# Patient Record
Sex: Female | Born: 1965 | Race: Black or African American | Hispanic: No | Smoking: Former smoker
Health system: Southern US, Community
[De-identification: ages and names within clinical notes are randomized; demographics above are authoritative.]

## PROBLEM LIST (undated history)

## (undated) DIAGNOSIS — E669 Obesity, unspecified: Secondary | ICD-10-CM

## (undated) DIAGNOSIS — M549 Dorsalgia, unspecified: Secondary | ICD-10-CM

## (undated) DIAGNOSIS — G8929 Other chronic pain: Secondary | ICD-10-CM

## (undated) DIAGNOSIS — K2970 Gastritis, unspecified, without bleeding: Secondary | ICD-10-CM

## (undated) DIAGNOSIS — F17200 Nicotine dependence, unspecified, uncomplicated: Secondary | ICD-10-CM

## (undated) DIAGNOSIS — N83209 Unspecified ovarian cyst, unspecified side: Secondary | ICD-10-CM

## (undated) HISTORY — PX: OTHER SURGICAL HISTORY: SHX169

## (undated) HISTORY — DX: Other chronic pain: G89.29

## (undated) HISTORY — PX: TUBAL LIGATION: SHX77

## (undated) HISTORY — DX: Gastritis, unspecified, without bleeding: K29.70

## (undated) HISTORY — DX: Nicotine dependence, unspecified, uncomplicated: F17.200

## (undated) HISTORY — DX: Obesity, unspecified: E66.9

## (undated) HISTORY — DX: Dorsalgia, unspecified: M54.9

---
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
## 1989-05-27 HISTORY — PX: OTHER SURGICAL HISTORY: SHX169

## 1991-05-28 HISTORY — PX: OTHER SURGICAL HISTORY: SHX169

## 1998-09-14 ENCOUNTER — Other Ambulatory Visit: Admission: RE | Admit: 1998-09-14 | Discharge: 1998-09-14 | Payer: Self-pay | Admitting: Family Medicine

## 1999-02-23 ENCOUNTER — Encounter: Payer: Self-pay | Admitting: Family Medicine

## 1999-02-23 ENCOUNTER — Ambulatory Visit (HOSPITAL_COMMUNITY): Admission: RE | Admit: 1999-02-23 | Discharge: 1999-02-23 | Payer: Self-pay | Admitting: Family Medicine

## 2000-10-11 ENCOUNTER — Emergency Department (HOSPITAL_COMMUNITY): Admission: EM | Admit: 2000-10-11 | Discharge: 2000-10-12 | Payer: Self-pay | Admitting: *Deleted

## 2001-09-04 ENCOUNTER — Emergency Department (HOSPITAL_COMMUNITY): Admission: EM | Admit: 2001-09-04 | Discharge: 2001-09-04 | Payer: Self-pay | Admitting: Internal Medicine

## 2002-04-26 ENCOUNTER — Encounter: Payer: Self-pay | Admitting: Family Medicine

## 2002-04-26 ENCOUNTER — Ambulatory Visit (HOSPITAL_COMMUNITY): Admission: RE | Admit: 2002-04-26 | Discharge: 2002-04-26 | Payer: Self-pay | Admitting: Family Medicine

## 2002-09-13 ENCOUNTER — Encounter (HOSPITAL_COMMUNITY): Admission: RE | Admit: 2002-09-13 | Discharge: 2002-10-13 | Payer: Self-pay | Admitting: Family Medicine

## 2002-10-06 ENCOUNTER — Ambulatory Visit (HOSPITAL_COMMUNITY): Admission: RE | Admit: 2002-10-06 | Discharge: 2002-10-06 | Payer: Self-pay | Admitting: Family Medicine

## 2002-10-06 ENCOUNTER — Encounter: Payer: Self-pay | Admitting: Family Medicine

## 2002-10-13 ENCOUNTER — Encounter (HOSPITAL_COMMUNITY): Admission: RE | Admit: 2002-10-13 | Discharge: 2002-11-12 | Payer: Self-pay | Admitting: Family Medicine

## 2002-11-23 ENCOUNTER — Encounter (HOSPITAL_COMMUNITY): Admission: RE | Admit: 2002-11-23 | Discharge: 2002-12-23 | Payer: Self-pay | Admitting: Family Medicine

## 2003-02-22 ENCOUNTER — Ambulatory Visit (HOSPITAL_COMMUNITY): Admission: RE | Admit: 2003-02-22 | Discharge: 2003-02-22 | Payer: Self-pay | Admitting: Family Medicine

## 2003-02-22 ENCOUNTER — Encounter: Payer: Self-pay | Admitting: Family Medicine

## 2003-02-28 ENCOUNTER — Encounter: Payer: Self-pay | Admitting: Orthopedic Surgery

## 2003-03-07 ENCOUNTER — Encounter: Payer: Self-pay | Admitting: Orthopedic Surgery

## 2003-03-08 ENCOUNTER — Encounter (HOSPITAL_COMMUNITY): Admission: RE | Admit: 2003-03-08 | Discharge: 2003-04-07 | Payer: Self-pay | Admitting: Orthopedic Surgery

## 2004-04-27 ENCOUNTER — Ambulatory Visit: Payer: Self-pay | Admitting: Family Medicine

## 2004-10-02 ENCOUNTER — Ambulatory Visit: Payer: Self-pay | Admitting: Family Medicine

## 2004-11-29 ENCOUNTER — Ambulatory Visit: Payer: Self-pay | Admitting: Family Medicine

## 2004-12-18 ENCOUNTER — Ambulatory Visit: Payer: Self-pay | Admitting: Family Medicine

## 2005-04-15 ENCOUNTER — Ambulatory Visit: Payer: Self-pay | Admitting: Family Medicine

## 2005-05-27 HISTORY — PX: CHOLECYSTECTOMY: SHX55

## 2005-06-11 ENCOUNTER — Ambulatory Visit: Payer: Self-pay | Admitting: Family Medicine

## 2005-06-11 ENCOUNTER — Ambulatory Visit (HOSPITAL_COMMUNITY): Admission: RE | Admit: 2005-06-11 | Discharge: 2005-06-11 | Payer: Self-pay | Admitting: Family Medicine

## 2005-07-03 ENCOUNTER — Ambulatory Visit (HOSPITAL_COMMUNITY): Admission: RE | Admit: 2005-07-03 | Discharge: 2005-07-03 | Payer: Self-pay | Admitting: Family Medicine

## 2005-07-16 ENCOUNTER — Ambulatory Visit: Payer: Self-pay | Admitting: Family Medicine

## 2005-07-17 ENCOUNTER — Ambulatory Visit (HOSPITAL_COMMUNITY): Admission: RE | Admit: 2005-07-17 | Discharge: 2005-07-17 | Payer: Self-pay | Admitting: Family Medicine

## 2005-07-23 ENCOUNTER — Encounter (HOSPITAL_COMMUNITY): Admission: RE | Admit: 2005-07-23 | Discharge: 2005-08-22 | Payer: Self-pay | Admitting: Family Medicine

## 2005-08-05 ENCOUNTER — Ambulatory Visit: Payer: Self-pay | Admitting: Family Medicine

## 2005-08-06 ENCOUNTER — Ambulatory Visit (HOSPITAL_COMMUNITY): Admission: RE | Admit: 2005-08-06 | Discharge: 2005-08-06 | Payer: Self-pay | Admitting: Family Medicine

## 2005-08-07 ENCOUNTER — Ambulatory Visit: Payer: Self-pay | Admitting: Family Medicine

## 2005-12-04 ENCOUNTER — Ambulatory Visit: Payer: Self-pay | Admitting: Family Medicine

## 2005-12-04 ENCOUNTER — Other Ambulatory Visit: Admission: RE | Admit: 2005-12-04 | Discharge: 2005-12-04 | Payer: Self-pay | Admitting: Family Medicine

## 2006-01-27 ENCOUNTER — Emergency Department (HOSPITAL_COMMUNITY): Admission: EM | Admit: 2006-01-27 | Discharge: 2006-01-27 | Payer: Self-pay | Admitting: Emergency Medicine

## 2006-02-11 ENCOUNTER — Ambulatory Visit: Payer: Self-pay | Admitting: Family Medicine

## 2006-03-05 ENCOUNTER — Ambulatory Visit: Payer: Self-pay | Admitting: Family Medicine

## 2006-03-13 ENCOUNTER — Ambulatory Visit (HOSPITAL_COMMUNITY): Admission: RE | Admit: 2006-03-13 | Discharge: 2006-03-13 | Payer: Self-pay | Admitting: Family Medicine

## 2006-03-17 ENCOUNTER — Encounter (HOSPITAL_COMMUNITY): Admission: RE | Admit: 2006-03-17 | Discharge: 2006-04-16 | Payer: Self-pay | Admitting: Family Medicine

## 2006-03-28 ENCOUNTER — Encounter (INDEPENDENT_AMBULATORY_CARE_PROVIDER_SITE_OTHER): Payer: Self-pay | Admitting: *Deleted

## 2006-03-28 ENCOUNTER — Ambulatory Visit (HOSPITAL_COMMUNITY): Admission: RE | Admit: 2006-03-28 | Discharge: 2006-03-28 | Payer: Self-pay | Admitting: General Surgery

## 2006-07-03 ENCOUNTER — Ambulatory Visit: Payer: Self-pay | Admitting: Family Medicine

## 2006-07-07 ENCOUNTER — Ambulatory Visit (HOSPITAL_COMMUNITY): Admission: RE | Admit: 2006-07-07 | Discharge: 2006-07-07 | Payer: Self-pay | Admitting: Family Medicine

## 2006-07-11 ENCOUNTER — Ambulatory Visit: Payer: Self-pay | Admitting: Family Medicine

## 2006-07-12 ENCOUNTER — Encounter: Payer: Self-pay | Admitting: Family Medicine

## 2006-07-14 ENCOUNTER — Ambulatory Visit (HOSPITAL_COMMUNITY): Admission: RE | Admit: 2006-07-14 | Discharge: 2006-07-14 | Payer: Self-pay | Admitting: Family Medicine

## 2006-08-22 ENCOUNTER — Ambulatory Visit: Payer: Self-pay | Admitting: Family Medicine

## 2006-09-18 ENCOUNTER — Encounter (INDEPENDENT_AMBULATORY_CARE_PROVIDER_SITE_OTHER): Payer: Self-pay | Admitting: Specialist

## 2006-09-18 ENCOUNTER — Ambulatory Visit (HOSPITAL_COMMUNITY): Admission: RE | Admit: 2006-09-18 | Discharge: 2006-09-18 | Payer: Self-pay | Admitting: Obstetrics and Gynecology

## 2006-12-19 ENCOUNTER — Emergency Department (HOSPITAL_COMMUNITY): Admission: EM | Admit: 2006-12-19 | Discharge: 2006-12-19 | Payer: Self-pay | Admitting: Emergency Medicine

## 2006-12-22 ENCOUNTER — Ambulatory Visit: Payer: Self-pay | Admitting: Family Medicine

## 2007-01-14 ENCOUNTER — Encounter (INDEPENDENT_AMBULATORY_CARE_PROVIDER_SITE_OTHER): Payer: Self-pay | Admitting: *Deleted

## 2007-01-15 ENCOUNTER — Encounter: Payer: Self-pay | Admitting: Family Medicine

## 2007-01-15 ENCOUNTER — Other Ambulatory Visit: Admission: RE | Admit: 2007-01-15 | Discharge: 2007-01-15 | Payer: Self-pay | Admitting: Family Medicine

## 2007-01-15 ENCOUNTER — Ambulatory Visit: Payer: Self-pay | Admitting: Family Medicine

## 2007-01-15 LAB — CONVERTED CEMR LAB: Pap Smear: NORMAL

## 2007-01-16 ENCOUNTER — Encounter: Payer: Self-pay | Admitting: Family Medicine

## 2007-01-16 LAB — CONVERTED CEMR LAB
Gardnerella vaginalis: POSITIVE — AB
Trichomonal Vaginitis: NEGATIVE

## 2007-01-19 ENCOUNTER — Encounter: Payer: Self-pay | Admitting: Family Medicine

## 2007-01-19 LAB — CONVERTED CEMR LAB

## 2007-02-02 ENCOUNTER — Encounter: Admission: RE | Admit: 2007-02-02 | Discharge: 2007-02-02 | Payer: Self-pay | Admitting: Family Medicine

## 2007-02-26 ENCOUNTER — Ambulatory Visit: Payer: Self-pay | Admitting: Family Medicine

## 2007-05-28 ENCOUNTER — Emergency Department (HOSPITAL_COMMUNITY): Admission: EM | Admit: 2007-05-28 | Discharge: 2007-05-28 | Payer: Self-pay | Admitting: Emergency Medicine

## 2007-05-28 ENCOUNTER — Encounter: Payer: Self-pay | Admitting: Family Medicine

## 2007-06-08 ENCOUNTER — Ambulatory Visit: Payer: Self-pay | Admitting: Family Medicine

## 2007-06-08 ENCOUNTER — Ambulatory Visit (HOSPITAL_COMMUNITY): Admission: RE | Admit: 2007-06-08 | Discharge: 2007-06-08 | Payer: Self-pay | Admitting: Family Medicine

## 2007-07-23 ENCOUNTER — Encounter (HOSPITAL_COMMUNITY): Admission: RE | Admit: 2007-07-23 | Discharge: 2007-08-22 | Payer: Self-pay | Admitting: Family Medicine

## 2007-07-28 ENCOUNTER — Ambulatory Visit: Payer: Self-pay | Admitting: Family Medicine

## 2007-08-11 ENCOUNTER — Encounter (INDEPENDENT_AMBULATORY_CARE_PROVIDER_SITE_OTHER): Payer: Self-pay | Admitting: *Deleted

## 2007-08-11 DIAGNOSIS — M549 Dorsalgia, unspecified: Secondary | ICD-10-CM

## 2007-08-11 DIAGNOSIS — K649 Unspecified hemorrhoids: Secondary | ICD-10-CM | POA: Insufficient documentation

## 2007-08-11 DIAGNOSIS — F17200 Nicotine dependence, unspecified, uncomplicated: Secondary | ICD-10-CM | POA: Insufficient documentation

## 2007-08-11 DIAGNOSIS — E669 Obesity, unspecified: Secondary | ICD-10-CM

## 2007-08-24 ENCOUNTER — Encounter (HOSPITAL_COMMUNITY): Admission: RE | Admit: 2007-08-24 | Discharge: 2007-09-23 | Payer: Self-pay | Admitting: Family Medicine

## 2007-10-27 ENCOUNTER — Encounter: Payer: Self-pay | Admitting: Family Medicine

## 2007-10-27 LAB — CONVERTED CEMR LAB
Amylase: 31 units/L (ref 0–105)
Calcium: 9.3 mg/dL (ref 8.4–10.5)
Glucose, Bld: 97 mg/dL (ref 70–99)
Helicobacter Pylori Antibody-IgG: 3.2 — ABNORMAL HIGH

## 2007-10-28 ENCOUNTER — Ambulatory Visit (HOSPITAL_COMMUNITY): Admission: RE | Admit: 2007-10-28 | Discharge: 2007-10-28 | Payer: Self-pay | Admitting: Family Medicine

## 2007-12-17 ENCOUNTER — Encounter: Payer: Self-pay | Admitting: Family Medicine

## 2007-12-21 ENCOUNTER — Ambulatory Visit: Payer: Self-pay | Admitting: Family Medicine

## 2007-12-21 LAB — CONVERTED CEMR LAB: GC Probe Amp, Genital: NEGATIVE

## 2007-12-22 ENCOUNTER — Encounter: Payer: Self-pay | Admitting: Family Medicine

## 2007-12-22 LAB — CONVERTED CEMR LAB
Candida species: NEGATIVE
Gardnerella vaginalis: POSITIVE — AB
Trichomonal Vaginitis: NEGATIVE

## 2007-12-24 ENCOUNTER — Telehealth: Payer: Self-pay | Admitting: Family Medicine

## 2007-12-29 ENCOUNTER — Telehealth: Payer: Self-pay | Admitting: Family Medicine

## 2008-01-19 ENCOUNTER — Ambulatory Visit: Payer: Self-pay | Admitting: Family Medicine

## 2008-01-19 ENCOUNTER — Telehealth: Payer: Self-pay | Admitting: Family Medicine

## 2008-01-19 DIAGNOSIS — R5381 Other malaise: Secondary | ICD-10-CM

## 2008-01-19 DIAGNOSIS — R5383 Other fatigue: Secondary | ICD-10-CM

## 2008-01-19 LAB — CONVERTED CEMR LAB: Rapid Strep: NEGATIVE

## 2008-01-22 ENCOUNTER — Telehealth: Payer: Self-pay | Admitting: Family Medicine

## 2008-02-25 ENCOUNTER — Encounter: Payer: Self-pay | Admitting: Family Medicine

## 2008-03-02 ENCOUNTER — Telehealth: Payer: Self-pay | Admitting: Family Medicine

## 2008-03-03 ENCOUNTER — Encounter: Payer: Self-pay | Admitting: Family Medicine

## 2008-03-07 ENCOUNTER — Ambulatory Visit: Payer: Self-pay | Admitting: Family Medicine

## 2008-03-07 ENCOUNTER — Other Ambulatory Visit: Admission: RE | Admit: 2008-03-07 | Discharge: 2008-03-07 | Payer: Self-pay | Admitting: Family Medicine

## 2008-03-07 ENCOUNTER — Encounter: Payer: Self-pay | Admitting: Family Medicine

## 2008-03-07 LAB — CONVERTED CEMR LAB: Pap Smear: NORMAL

## 2008-03-08 ENCOUNTER — Telehealth: Payer: Self-pay | Admitting: Family Medicine

## 2008-03-09 ENCOUNTER — Encounter: Payer: Self-pay | Admitting: Family Medicine

## 2008-03-09 DIAGNOSIS — N771 Vaginitis, vulvitis and vulvovaginitis in diseases classified elsewhere: Secondary | ICD-10-CM | POA: Insufficient documentation

## 2008-03-10 ENCOUNTER — Telehealth: Payer: Self-pay | Admitting: Family Medicine

## 2008-03-14 ENCOUNTER — Ambulatory Visit (HOSPITAL_COMMUNITY): Admission: RE | Admit: 2008-03-14 | Discharge: 2008-03-14 | Payer: Self-pay | Admitting: Family Medicine

## 2008-03-14 ENCOUNTER — Encounter: Payer: Self-pay | Admitting: Family Medicine

## 2008-03-15 ENCOUNTER — Encounter: Payer: Self-pay | Admitting: Family Medicine

## 2008-03-25 ENCOUNTER — Encounter: Payer: Self-pay | Admitting: Family Medicine

## 2008-03-30 ENCOUNTER — Encounter: Payer: Self-pay | Admitting: Family Medicine

## 2008-05-12 ENCOUNTER — Encounter: Payer: Self-pay | Admitting: Family Medicine

## 2008-05-23 ENCOUNTER — Encounter: Payer: Self-pay | Admitting: Family Medicine

## 2008-06-21 ENCOUNTER — Ambulatory Visit: Payer: Self-pay | Admitting: Family Medicine

## 2008-06-21 DIAGNOSIS — M25569 Pain in unspecified knee: Secondary | ICD-10-CM

## 2008-06-21 DIAGNOSIS — F411 Generalized anxiety disorder: Secondary | ICD-10-CM

## 2008-06-21 LAB — CONVERTED CEMR LAB
ALT: 16 units/L (ref 0–35)
AST: 14 units/L (ref 0–37)
Bilirubin, Direct: 0.1 mg/dL (ref 0.0–0.3)
Indirect Bilirubin: 0.2 mg/dL (ref 0.0–0.9)

## 2008-06-28 ENCOUNTER — Telehealth: Payer: Self-pay | Admitting: Family Medicine

## 2008-06-29 DIAGNOSIS — N943 Premenstrual tension syndrome: Secondary | ICD-10-CM | POA: Insufficient documentation

## 2008-07-12 ENCOUNTER — Telehealth: Payer: Self-pay | Admitting: Family Medicine

## 2008-08-03 ENCOUNTER — Ambulatory Visit: Payer: Self-pay | Admitting: Internal Medicine

## 2008-08-03 ENCOUNTER — Encounter: Payer: Self-pay | Admitting: Gastroenterology

## 2008-08-03 DIAGNOSIS — K921 Melena: Secondary | ICD-10-CM | POA: Insufficient documentation

## 2008-08-03 DIAGNOSIS — R1319 Other dysphagia: Secondary | ICD-10-CM

## 2008-08-03 DIAGNOSIS — K5900 Constipation, unspecified: Secondary | ICD-10-CM | POA: Insufficient documentation

## 2008-08-03 DIAGNOSIS — K625 Hemorrhage of anus and rectum: Secondary | ICD-10-CM | POA: Insufficient documentation

## 2008-08-04 ENCOUNTER — Encounter: Payer: Self-pay | Admitting: Internal Medicine

## 2008-08-19 ENCOUNTER — Ambulatory Visit: Payer: Self-pay | Admitting: Internal Medicine

## 2008-08-19 ENCOUNTER — Ambulatory Visit (HOSPITAL_COMMUNITY): Admission: RE | Admit: 2008-08-19 | Discharge: 2008-08-19 | Payer: Self-pay | Admitting: Internal Medicine

## 2008-08-19 ENCOUNTER — Encounter: Payer: Self-pay | Admitting: Internal Medicine

## 2008-08-19 HISTORY — PX: COLONOSCOPY: SHX174

## 2008-08-19 HISTORY — PX: ESOPHAGOGASTRODUODENOSCOPY: SHX1529

## 2008-08-31 ENCOUNTER — Encounter: Payer: Self-pay | Admitting: Internal Medicine

## 2008-09-13 ENCOUNTER — Encounter: Payer: Self-pay | Admitting: Family Medicine

## 2008-09-15 ENCOUNTER — Telehealth: Payer: Self-pay | Admitting: Family Medicine

## 2008-09-19 ENCOUNTER — Telehealth: Payer: Self-pay | Admitting: Family Medicine

## 2008-09-20 ENCOUNTER — Encounter: Payer: Self-pay | Admitting: Family Medicine

## 2008-09-21 ENCOUNTER — Telehealth: Payer: Self-pay | Admitting: Family Medicine

## 2008-09-28 ENCOUNTER — Telehealth: Payer: Self-pay | Admitting: Family Medicine

## 2008-10-12 ENCOUNTER — Telehealth: Payer: Self-pay | Admitting: Family Medicine

## 2008-11-02 ENCOUNTER — Telehealth: Payer: Self-pay | Admitting: Family Medicine

## 2008-11-10 ENCOUNTER — Telehealth (INDEPENDENT_AMBULATORY_CARE_PROVIDER_SITE_OTHER): Payer: Self-pay | Admitting: *Deleted

## 2008-11-11 ENCOUNTER — Ambulatory Visit: Payer: Self-pay | Admitting: Family Medicine

## 2008-11-21 ENCOUNTER — Telehealth (INDEPENDENT_AMBULATORY_CARE_PROVIDER_SITE_OTHER): Payer: Self-pay | Admitting: *Deleted

## 2008-11-22 ENCOUNTER — Telehealth: Payer: Self-pay | Admitting: Family Medicine

## 2009-01-16 ENCOUNTER — Telehealth: Payer: Self-pay | Admitting: Family Medicine

## 2009-01-19 ENCOUNTER — Ambulatory Visit: Payer: Self-pay | Admitting: Family Medicine

## 2009-01-23 ENCOUNTER — Telehealth: Payer: Self-pay | Admitting: Family Medicine

## 2009-02-03 ENCOUNTER — Encounter: Payer: Self-pay | Admitting: Family Medicine

## 2009-03-08 ENCOUNTER — Other Ambulatory Visit: Admission: RE | Admit: 2009-03-08 | Discharge: 2009-03-08 | Payer: Self-pay | Admitting: Family Medicine

## 2009-03-08 ENCOUNTER — Ambulatory Visit: Payer: Self-pay | Admitting: Family Medicine

## 2009-03-08 ENCOUNTER — Encounter: Payer: Self-pay | Admitting: Family Medicine

## 2009-03-08 DIAGNOSIS — N3000 Acute cystitis without hematuria: Secondary | ICD-10-CM

## 2009-03-08 LAB — CONVERTED CEMR LAB
Blood in Urine, dipstick: NEGATIVE
Protein, U semiquant: 30
Urobilinogen, UA: 0.2
pH: 6

## 2009-03-10 ENCOUNTER — Encounter: Payer: Self-pay | Admitting: Family Medicine

## 2009-03-10 ENCOUNTER — Telehealth: Payer: Self-pay | Admitting: Family Medicine

## 2009-03-10 DIAGNOSIS — K219 Gastro-esophageal reflux disease without esophagitis: Secondary | ICD-10-CM | POA: Insufficient documentation

## 2009-03-10 LAB — CONVERTED CEMR LAB
Candida species: NEGATIVE
Chlamydia, DNA Probe: NEGATIVE
GC Probe Amp, Genital: NEGATIVE

## 2009-03-22 ENCOUNTER — Ambulatory Visit (HOSPITAL_COMMUNITY): Admission: RE | Admit: 2009-03-22 | Discharge: 2009-03-22 | Payer: Self-pay | Admitting: Family Medicine

## 2009-04-03 ENCOUNTER — Telehealth: Payer: Self-pay | Admitting: Family Medicine

## 2009-04-05 ENCOUNTER — Telehealth: Payer: Self-pay | Admitting: Family Medicine

## 2009-04-08 ENCOUNTER — Encounter: Payer: Self-pay | Admitting: Family Medicine

## 2009-04-10 LAB — CONVERTED CEMR LAB
BUN: 8 mg/dL (ref 6–23)
Basophils Absolute: 0 10*3/uL (ref 0.0–0.1)
Chloride: 105 meq/L (ref 96–112)
Cholesterol: 159 mg/dL (ref 0–200)
Eosinophils Relative: 3 % (ref 0–5)
HCT: 37.8 % (ref 36.0–46.0)
HDL: 50 mg/dL (ref >39)
Hemoglobin: 12.6 g/dL (ref 12.0–15.0)
LDL Cholesterol: 85 mg/dL (ref 0–99)
Lymphocytes Relative: 50 % — ABNORMAL HIGH (ref 12–46)
Lymphs Abs: 3.7 10*3/uL (ref 0.7–4.0)
Neutro Abs: 3 10*3/uL (ref 1.7–7.7)
Platelets: 334 10*3/uL (ref 150–400)
Potassium: 4 meq/L (ref 3.5–5.3)
Sodium: 139 meq/L (ref 135–145)
Total CHOL/HDL Ratio: 3.2
Triglycerides: 119 mg/dL (ref <150)
VLDL: 24 mg/dL (ref 0–40)
WBC: 7.5 10*3/uL (ref 4.0–10.5)

## 2009-04-12 ENCOUNTER — Encounter: Payer: Self-pay | Admitting: Family Medicine

## 2009-04-12 ENCOUNTER — Encounter (HOSPITAL_COMMUNITY): Admission: RE | Admit: 2009-04-12 | Discharge: 2009-05-12 | Payer: Self-pay | Admitting: Family Medicine

## 2009-04-26 ENCOUNTER — Telehealth: Payer: Self-pay | Admitting: Family Medicine

## 2009-04-26 ENCOUNTER — Ambulatory Visit (HOSPITAL_COMMUNITY): Admission: RE | Admit: 2009-04-26 | Discharge: 2009-04-26 | Payer: Self-pay | Admitting: Family Medicine

## 2009-05-15 ENCOUNTER — Encounter (HOSPITAL_COMMUNITY): Admission: RE | Admit: 2009-05-15 | Discharge: 2009-06-14 | Payer: Self-pay | Admitting: Family Medicine

## 2009-07-18 ENCOUNTER — Telehealth: Payer: Self-pay | Admitting: Family Medicine

## 2009-07-19 ENCOUNTER — Ambulatory Visit: Payer: Self-pay | Admitting: Family Medicine

## 2009-07-19 DIAGNOSIS — J019 Acute sinusitis, unspecified: Secondary | ICD-10-CM | POA: Insufficient documentation

## 2009-07-26 ENCOUNTER — Telehealth: Payer: Self-pay | Admitting: Physician Assistant

## 2009-07-28 ENCOUNTER — Telehealth: Payer: Self-pay | Admitting: Family Medicine

## 2009-08-07 ENCOUNTER — Telehealth: Payer: Self-pay | Admitting: Family Medicine

## 2009-08-23 ENCOUNTER — Telehealth: Payer: Self-pay | Admitting: Family Medicine

## 2009-09-05 ENCOUNTER — Telehealth: Payer: Self-pay | Admitting: Family Medicine

## 2009-09-08 ENCOUNTER — Encounter: Payer: Self-pay | Admitting: Family Medicine

## 2009-10-10 ENCOUNTER — Encounter: Payer: Self-pay | Admitting: Family Medicine

## 2009-11-14 ENCOUNTER — Ambulatory Visit: Payer: Self-pay | Admitting: Family Medicine

## 2009-11-14 DIAGNOSIS — N760 Acute vaginitis: Secondary | ICD-10-CM | POA: Insufficient documentation

## 2009-11-14 DIAGNOSIS — E559 Vitamin D deficiency, unspecified: Secondary | ICD-10-CM | POA: Insufficient documentation

## 2009-11-15 ENCOUNTER — Encounter: Payer: Self-pay | Admitting: Physician Assistant

## 2009-11-15 LAB — CONVERTED CEMR LAB
Chlamydia, DNA Probe: NEGATIVE
GC Probe Amp, Genital: NEGATIVE
Gardnerella vaginalis: POSITIVE — AB

## 2009-12-05 ENCOUNTER — Telehealth: Payer: Self-pay | Admitting: Family Medicine

## 2009-12-05 ENCOUNTER — Ambulatory Visit: Payer: Self-pay | Admitting: Family Medicine

## 2009-12-08 ENCOUNTER — Encounter: Payer: Self-pay | Admitting: Family Medicine

## 2009-12-15 ENCOUNTER — Ambulatory Visit: Payer: Self-pay | Admitting: Family Medicine

## 2009-12-15 ENCOUNTER — Encounter: Payer: Self-pay | Admitting: Physician Assistant

## 2009-12-15 DIAGNOSIS — F410 Panic disorder [episodic paroxysmal anxiety] without agoraphobia: Secondary | ICD-10-CM

## 2009-12-15 DIAGNOSIS — T2027XA Burn of second degree of neck, initial encounter: Secondary | ICD-10-CM

## 2010-01-15 ENCOUNTER — Telehealth: Payer: Self-pay | Admitting: Family Medicine

## 2010-01-19 ENCOUNTER — Ambulatory Visit: Payer: Self-pay | Admitting: Family Medicine

## 2010-01-19 LAB — CONVERTED CEMR LAB
Beta hcg, urine, semiquantitative: NEGATIVE
Bilirubin Urine: NEGATIVE
Ketones, urine, test strip: NEGATIVE
Urobilinogen, UA: 0.2

## 2010-01-22 ENCOUNTER — Telehealth: Payer: Self-pay | Admitting: Physician Assistant

## 2010-01-25 ENCOUNTER — Telehealth: Payer: Self-pay | Admitting: Physician Assistant

## 2010-01-26 ENCOUNTER — Encounter: Payer: Self-pay | Admitting: Family Medicine

## 2010-02-23 ENCOUNTER — Telehealth: Payer: Self-pay | Admitting: Physician Assistant

## 2010-03-06 ENCOUNTER — Telehealth: Payer: Self-pay | Admitting: Family Medicine

## 2010-03-08 ENCOUNTER — Ambulatory Visit: Payer: Self-pay | Admitting: Family Medicine

## 2010-03-08 DIAGNOSIS — N3090 Cystitis, unspecified without hematuria: Secondary | ICD-10-CM

## 2010-03-08 DIAGNOSIS — J209 Acute bronchitis, unspecified: Secondary | ICD-10-CM | POA: Insufficient documentation

## 2010-03-08 LAB — CONVERTED CEMR LAB
Glucose, Urine, Semiquant: NEGATIVE
Specific Gravity, Urine: 1.02
WBC Urine, dipstick: NEGATIVE
pH: 6

## 2010-03-15 ENCOUNTER — Ambulatory Visit: Payer: Self-pay | Admitting: Family Medicine

## 2010-03-15 ENCOUNTER — Other Ambulatory Visit: Admission: RE | Admit: 2010-03-15 | Discharge: 2010-03-15 | Payer: Self-pay | Admitting: Family Medicine

## 2010-03-15 ENCOUNTER — Ambulatory Visit (HOSPITAL_COMMUNITY): Admission: RE | Admit: 2010-03-15 | Discharge: 2010-03-15 | Payer: Self-pay | Admitting: Family Medicine

## 2010-03-15 LAB — HM PAP SMEAR

## 2010-03-19 ENCOUNTER — Encounter: Payer: Self-pay | Admitting: Family Medicine

## 2010-03-19 LAB — CONVERTED CEMR LAB
Alkaline Phosphatase: 72 units/L (ref 39–117)
BUN: 11 mg/dL (ref 6–23)
Chloride: 105 meq/L (ref 96–112)
Cholesterol: 170 mg/dL (ref 0–200)
Creatinine, Ser: 0.75 mg/dL (ref 0.40–1.20)
HDL: 64 mg/dL (ref >39)
Hemoglobin: 13.6 g/dL (ref 12.0–15.0)
LDL Cholesterol: 76 mg/dL (ref 0–99)
Platelets: 345 10*3/uL (ref 150–400)
Potassium: 4.3 meq/L (ref 3.5–5.3)
RDW: 13.7 % (ref 11.5–15.5)
Total Bilirubin: 0.4 mg/dL (ref 0.3–1.2)
Total CHOL/HDL Ratio: 2.7
Triglycerides: 148 mg/dL (ref <150)
VLDL: 30 mg/dL (ref 0–40)

## 2010-03-21 ENCOUNTER — Encounter: Payer: Self-pay | Admitting: Physician Assistant

## 2010-03-27 ENCOUNTER — Ambulatory Visit (HOSPITAL_COMMUNITY): Admission: RE | Admit: 2010-03-27 | Discharge: 2010-03-27 | Payer: Self-pay | Admitting: Family Medicine

## 2010-03-28 ENCOUNTER — Telehealth: Payer: Self-pay | Admitting: Family Medicine

## 2010-03-29 ENCOUNTER — Telehealth (INDEPENDENT_AMBULATORY_CARE_PROVIDER_SITE_OTHER): Payer: Self-pay | Admitting: *Deleted

## 2010-03-30 ENCOUNTER — Encounter: Payer: Self-pay | Admitting: Family Medicine

## 2010-04-04 ENCOUNTER — Encounter: Payer: Self-pay | Admitting: Family Medicine

## 2010-04-30 ENCOUNTER — Telehealth: Payer: Self-pay | Admitting: Family Medicine

## 2010-05-02 ENCOUNTER — Ambulatory Visit: Payer: Self-pay | Admitting: Family Medicine

## 2010-05-02 DIAGNOSIS — R110 Nausea: Secondary | ICD-10-CM

## 2010-05-04 LAB — CONVERTED CEMR LAB: Helicobacter Pylori Antibody-IgG: 5.1 — ABNORMAL HIGH

## 2010-05-08 ENCOUNTER — Telehealth (INDEPENDENT_AMBULATORY_CARE_PROVIDER_SITE_OTHER): Payer: Self-pay | Admitting: *Deleted

## 2010-05-09 ENCOUNTER — Telehealth: Payer: Self-pay | Admitting: Family Medicine

## 2010-06-04 ENCOUNTER — Ambulatory Visit: Admit: 2010-06-04 | Payer: Self-pay | Admitting: Internal Medicine

## 2010-06-17 ENCOUNTER — Encounter: Payer: Self-pay | Admitting: Family Medicine

## 2010-06-24 LAB — CONVERTED CEMR LAB
Eosinophils Absolute: 0.2 10*3/uL (ref 0.0–0.7)
Eosinophils Relative: 2 % (ref 0–5)
HCT: 40.6 % (ref 36.0–46.0)
LDL Cholesterol: 65 mg/dL (ref 0–99)
Lymphs Abs: 2.7 10*3/uL (ref 0.7–4.0)
MCV: 94 fL (ref 78.0–100.0)
Monocytes Absolute: 0.5 10*3/uL (ref 0.1–1.0)
Monocytes Relative: 7 % (ref 3–12)
OCCULT 1: NEGATIVE
Platelets: 364 10*3/uL (ref 150–400)
RBC: 4.32 M/uL (ref 3.87–5.11)
TSH: 0.812 microintl units/mL (ref 0.350–4.50)
VLDL: 48 mg/dL — ABNORMAL HIGH (ref 0–40)
WBC: 8.2 10*3/uL (ref 4.0–10.5)

## 2010-06-26 ENCOUNTER — Telehealth: Payer: Self-pay | Admitting: Family Medicine

## 2010-06-28 NOTE — Assessment & Plan Note (Signed)
Summary: sick - room 1   Vital Signs:  Patient profile:   44 year old female Menstrual status:  regular Height:      64 inches Weight:      182.75 pounds BMI:     31.48 O2 Sat:      98 % on Room air Temp:     99.4 degrees F oral Pulse rate:   93 / minute Resp:     16 per minute BP sitting:   116 / 70  (left arm)  Vitals Entered By: Adella Hare LPN (December 05, 2009 10:29 AM) CC: headache, sinus pressure, cough Is Patient Diabetic? No Comments did not bring meds to ov   Primary Provider:  Syliva Overman, MD  CC:  headache, sinus pressure, and cough.  History of Present Illness: Pt presents today with c/o sinus congestion and drainage x 1 1/2 weeks.  She has been using over the counter sinus rinses and cold products and syptoms are worsening.  Her mucus is thick and yellow in color.  No fever or chills.  She is coughing, but this seems to be more from the drainage.  Her chest does not feel congested.  No sore throat or ear pain.  Pt states she has about 5 - 6 days of left over antibiotic from previous prescription.  She didnt want to start it though until we told her OK.  Discussed with the pt the importance of completing antibiotic prescriptions.  Allergies (verified): No Known Drug Allergies  Past History:  Past medical history reviewed for relevance to current acute and chronic problems.  Past Medical History: Reviewed history from 08/03/2008 and no changes required. Current Problems:  BACK PAIN, CHRONIC (ICD-724.5) NICOTINE ADDICTION (ICD-305.1) OBESITY, UNSPECIFIED (ICD-278.00) HEMORRHOIDS (ICD-455.6) H/O remote EGD greater than 10 years ago, gastritis  Review of Systems General:  Denies chills and fever. ENT:  Complains of nasal congestion, postnasal drainage, and sinus pressure; denies earache and sore throat. CV:  Denies chest pain or discomfort. Resp:  Complains of cough and sputum productive; denies shortness of breath and wheezing.  Physical  Exam  General:  Well-developed,well-nourished,in no acute distress; alert,appropriate and cooperative throughout examination Head:  Normocephalic and atraumatic without obvious abnormalities. No apparent alopecia or balding. Ears:  External ear exam shows no significant lesions or deformities.  Otoscopic examination reveals clear canals, tympanic membranes are intact bilaterally without bulging, retraction, inflammation or discharge. Hearing is grossly normal bilaterally. Nose:  External nasal examination shows no deformity or inflammation. Nasal mucosa are pink and moist without lesions or exudates.L frontal sinus tenderness, L maxillary sinus tenderness, R frontal sinus tenderness, and R maxillary sinus tenderness.   Mouth:  Oral mucosa and oropharynx without lesions or exudates.  Neck:  No deformities, masses, or tenderness noted. Lungs:  Normal respiratory effort, chest expands symmetrically. Lungs are clear to auscultation, no crackles or wheezes. Heart:  Normal rate and regular rhythm. S1 and S2 normal without gallop, murmur, click, rub or other extra sounds. Cervical Nodes:  No lymphadenopathy noted Psych:  Cognition and judgment appear intact. Alert and cooperative with normal attention span and concentration. No apparent delusions, illusions, hallucinations   Impression & Recommendations:  Problem # 1:  SINUSITIS, ACUTE (ICD-461.9) Assessment New  Her updated medication list for this problem includes:    Metronidazole 500 Mg Tabs (Metronidazole) ..... One tab by mouth two times a day    Septra Ds 800-160 Mg Tabs (Sulfamethoxazole-trimethoprim) .Marland Kitchen... Take 1 two times a day  Complete Medication  List: 1)  Valtrex 500 Mg Tabs (Valacyclovir hcl) .... Take 1 tablet by mouth once a day 2)  Womens One A Day  .... One by mouth daily 3)  Fish Oil  .... One by mouth daily 4)  Vitamin C  .... One by mouth daily 5)  Vitamin D  .... One by mouth daily 6)  Dulcolax  .... One by mouth as needed  with use of vicodin 7)  Alprazolam 0.25 Mg Tabs (Alprazolam) .... Take 1 tab by mouth at bedtime 8)  Omeprazole 20 Mg Cpdr (Omeprazole) .... One cap by mouth qd 9)  Flexeril 10 Mg Tabs (Cyclobenzaprine hcl) .... One tab by mouth two times a day prn 10)  Prednisone (pak) 5 Mg Tabs (Prednisone) .... Use as directed 11)  Ibuprofen 800 Mg Tabs (Ibuprofen) .... Take 1 tablet by mouth three  times a day for 10 days, then one twice daily as needed for pain 12)  Vicodin Es 7.5-750 Mg Tabs (Hydrocodone-acetaminophen) .... Take 1 tab by mouth at bedtime as needed, no refill without ov 13)  Metronidazole 500 Mg Tabs (Metronidazole) .... One tab by mouth two times a day 14)  Septra Ds 800-160 Mg Tabs (Sulfamethoxazole-trimethoprim) .... Take 1 two times a day 15)  Fluconazole 150 Mg Tabs (Fluconazole) .... Take 1 by mouth as directed  Patient Instructions: 1)  Keep your next appt. 2)  I have prescribed more antibiotic for you. Take a full 10 day course of the antibiotic.  Do not stop when you start feeling better. 3)  I have prescribed Fluconazole for you if you develop a yeast infection. 4)  You may use an over the counter decongestant as needed. Prescriptions: FLUCONAZOLE 150 MG TABS (FLUCONAZOLE) take 1 by mouth as directed  #2 x 0   Entered and Authorized by:   Esperanza Sheets PA   Signed by:   Esperanza Sheets PA on 12/05/2009   Method used:   Electronically to        [facility]  [address] (retail)       [address]       [place], [place]  [address]       Ph: [phone]       Fax: [phone]   RxID:   [id number] SEPTRA DS 800-160 MG TABS (SULFAMETHOXAZOLE-TRIMETHOPRIM) take 1 two times a day  #10 x 0   Entered and Authorized by:   Esperanza Sheets PA   Signed by:   Esperanza Sheets PA on 12/05/2009   Method used:   Electronically to        [facility]  [address] (retail)       [address]       [place], [place]  [address]       Ph: [phone]       Fax:  [phone]   RxID:   [id number]

## 2010-06-28 NOTE — Assessment & Plan Note (Signed)
Summary: OV   Vital Signs:  Patient profile:   45 year old female Menstrual status:  regular LMP:     10/24/2009 Height:      64 inches Weight:      185.25 pounds BMI:     31.91 O2 Sat:      98 % Pulse rate:   85 / minute Pulse rhythm:   regular Resp:     16 per minute BP sitting:   112 / 76  (left arm) Cuff size:   large  Vitals Entered By: Everitt Amber LPN (November 14, 2009 2:08 PM)  Nutrition Counseling: Patient's BMI is greater than 25 and therefore counseled on weight management options. CC: Follow up chronic problems, some irritation in vaginal area, wants to be checked for yeast LMP (date): 10/24/2009     Enter LMP: 10/24/2009 Last PAP Result NEGATIVE FOR INTRAEPITHELIAL LESIONS OR MALIGNANCY.   Primary Care Provider:  Syliva Overman, MD  CC:  Follow up chronic problems, some irritation in vaginal area, and wants to be checked for yeast.  History of Present Illness: Reports  thatshe has been doing firly well. She recently had arthroscopy on her left knee and is to return to work soon. She has new c/o pain and was tild that the pain is likely from her back. She ahs worked on diet with resultant weight losss. She still smokes , but wants to quit. Denies recent fever or chills. Denies sinus pressure, nasal congestion , ear pain or sore throat. Denies chest congestion, or cough productive of sputum. Denies chest pain, palpitations, PND, orthopnea or leg swelling. Denies abdominal pain, nausea, vomitting, diarrhea or constipation. Denies change in bowel movements or bloody stool. Denies dysuria , frequency, incontinence or hesitancy.She has a 4 week h/o malodoroc vag d/c which she wants checked  Denies headaches, vertigo, seizures. Denies depression, anxiety or insomnia. Denies  rash, lesions, or itch.     Current Medications (verified): 1)  Valtrex 500 Mg Tabs (Valacyclovir Hcl) .... Take 1 Tablet By Mouth Once A Day 2)  Ibuprofen 800 Mg Tabs (Ibuprofen) ....  One Tab By Mouth Two Times A Day Prn 3)  Womens One A Day .... One By Mouth Daily 4)  Fish Oil .... One By Mouth Daily 5)  Vitamin C .... One By Mouth Daily 6)  Vitamin D .... One By Mouth Daily 7)  Dulcolax .... One By Mouth As Needed With Use of Vicodin 8)  Alprazolam 0.25 Mg Tabs (Alprazolam) .... Take 1 Tab By Mouth At Bedtime 9)  Omeprazole 20 Mg Cpdr (Omeprazole) .... One Cap By Mouth Qd 10)  Flexeril 10 Mg Tabs (Cyclobenzaprine Hcl) .... One Tab By Mouth Two Times A Day Prn 11)  Vicodin Es 7.5-750 Mg Tabs (Hydrocodone-Acetaminophen) .... Take 1 Tab By Mouth At Bedtime As Needed  Allergies (verified): No Known Drug Allergies  Past History:  Past Surgical History: Right inguinal hernia repair (1982) Right Carpal tunnel release (1991) Right knee surgery (1993) BTL and endometrial ablation (2008) Cholecystectomy, Dr. Franky Macho, 2007 Left knee surgery-arthroscopy June 2011, Dr. Eulah Pont  Review of Systems      See HPI Eyes:  Denies blurring and discharge. GU:  Complains of discharge; denies incontinence and urinary frequency; itching smell and irritation over the past 4 weeks. Used azo, some improvemnt. MS:  Complains of joint pain and low back pain; pt had arthroscopy this year on left knee it still hurts. She fell the beginning of May, since that time she  has had inc low back pain radiating post left lower ext and numbness in the toes. Allergy:  Denies hives or rash and itching eyes.  Physical Exam  General:  Well-developed,well-nourished,in no acute distress; alert,appropriate and cooperative throughout examination HEENT: No facial asymmetry,  EOMI, No sinus tenderness, TM's Clear, oropharynx  pink and moist.   Chest: Clear to auscultation bilaterally.  CVS: S1, S2, No murmurs, No S3.   Abd: Soft, Nontender.  EA:[id number]  ROM spine,adequate in  hips, shoulders and reduced left knee Ext: No edema.   CNS: CN 2-12 intact, power tone and sensation normal throughout.    Skin: Intact, no visible lesions or rashes.  Psych: Good eye contact, normal affect.  Memory intact, not anxious or depressed appearing. pelvic: white vaginal d/c , fishy odor, uteris nl size, no cervical motion or adnexal tenderness   Impression & Recommendations:  Problem # 1:  VAGINITIS (ICD-616.10) Assessment Comment Only  The following medications were removed from the medication list:    Bactrim Ds 800-160 Mg Tabs (Sulfamethoxazole-trimethoprim) .Marland Kitchen... Take 1 tablet by mouth two times a day Her updated medication list for this problem includes:    Metronidazole 500 Mg Tabs (Metronidazole) ..... One tab by mouth two times a day  Orders: T-Wet Prep by Molecular Probe [phone]) T-Chlamydia & GC Probe, Genital ([id number])  Problem # 2:  GERD (ICD-530.81) Assessment: Improved  Her updated medication list for this problem includes:    Omeprazole 20 Mg Cpdr (Omeprazole) ..... One cap by mouth qd  Problem # 3:  ANXIETY DISORDER CONDITIONS CLASSIFIED ELSEWHERE (ICD-293.84) Assessment: Improved  Problem # 4:  NICOTINE ADDICTION (ICD-305.1) Assessment: Unchanged  Encouraged smoking cessation and discussed different methods for smoking cessation.   Problem # 5:  OBESITY, UNSPECIFIED (ICD-278.00) Assessment: Improved  Ht: 64 (11/14/2009)   Wt: 185.25 (11/14/2009)   BMI: 31.91 (11/14/2009)  Problem # 6:  BACK PAIN, CHRONIC (ICD-724.5) Assessment: Deteriorated  The following medications were removed from the medication list:    Hydrocodone-acetaminophen 5-500 Mg Tabs (Hydrocodone-acetaminophen) ..... One tab by mouth two times a day prn    Ibuprofen 800 Mg Tabs (Ibuprofen) ..... One tab by mouth two times a day prn    Cyclobenzaprine Hcl 10 Mg Tabs (Cyclobenzaprine hcl) .Marland Kitchen... Take one tab by mouth at bedtime as needed    Vicodin Es 7.5-750 Mg Tabs (Hydrocodone-acetaminophen) .Marland Kitchen... Take 1 tab by mouth at bedtime as needed Her updated medication list for this problem  includes:    Flexeril 10 Mg Tabs (Cyclobenzaprine hcl) ..... One tab by mouth two times a day prn    Ibuprofen 800 Mg Tabs (Ibuprofen) .Marland Kitchen... Take 1 tablet by mouth three  times a day for 10 days, then one twice daily as needed for pain    Vicodin Es 7.5-750 Mg Tabs (Hydrocodone-acetaminophen) .Marland Kitchen... Take 1 tab by mouth at bedtime as needed, no refill without ov  Orders: Ketorolac-Toradol 15mg  (Y8657) Admin of Therapeutic Inj  intramuscular or subcutaneous (84696)  Complete Medication List: 1)  Valtrex 500 Mg Tabs (Valacyclovir hcl) .... Take 1 tablet by mouth once a day 2)  Womens One A Day  .... One by mouth daily 3)  Fish Oil  .... One by mouth daily 4)  Vitamin C  .... One by mouth daily 5)  Vitamin D  .... One by mouth daily 6)  Dulcolax  .... One by mouth as needed with use of vicodin 7)  Alprazolam 0.25 Mg Tabs (Alprazolam) .... Take 1 tab by mouth  at bedtime 8)  Omeprazole 20 Mg Cpdr (Omeprazole) .... One cap by mouth qd 9)  Flexeril 10 Mg Tabs (Cyclobenzaprine hcl) .... One tab by mouth two times a day prn 10)  Prednisone (pak) 5 Mg Tabs (Prednisone) .... Use as directed 11)  Ibuprofen 800 Mg Tabs (Ibuprofen) .... Take 1 tablet by mouth three  times a day for 10 days, then one twice daily as needed for pain 12)  Vicodin Es 7.5-750 Mg Tabs (Hydrocodone-acetaminophen) .... Take 1 tab by mouth at bedtime as needed, no refill without ov 13)  Metronidazole 500 Mg Tabs (Metronidazole) .... One tab by mouth two times a day  Other Orders: T-Vitamin D (25-Hydroxy) [id number])  Patient Instructions: 1)  Please schedule CPE October 14 or after 2)  It is important that you exercise regularly at least 20 minutes 5 times a week. If you develop chest pain, have severe difficulty breathing, or feel very tired , stop exercising immediately and seek medical attention. 3)  You need to lose weight. Consider a lower calorie diet and regular exercise. Congrats on your weight loss , keep it up 4)   Vit D level today. 5)  You will be treated based on lab results today Prescriptions: VICODIN ES 7.5-750 MG TABS (HYDROCODONE-ACETAMINOPHEN) Take 1 tab by mouth at bedtime as needed, no refill without ov  #30 x 0   Entered and Authorized by:   Syliva Overman MD   Signed by:   Syliva Overman MD on 11/14/2009   Method used:   Printed then faxed to ...       [facility]  [address] (retail)       [address]       [place], [place]  [address]       Ph: [phone]       Fax: [phone]   RxID:   [id number] IBUPROFEN 800 MG TABS (IBUPROFEN) Take 1 tablet by mouth three  times a day for 10 days, then one twice daily as needed for pain  #60 x 1   Entered and Authorized by:   Syliva Overman MD   Signed by:   Syliva Overman MD on 11/14/2009   Method used:   Electronically to        [facility]  [address] (retail)       [address]       [place], [place]  [address]       Ph: [phone]       Fax: [phone]   RxID:   [id number] PREDNISONE (PAK) 5 MG TABS (PREDNISONE) Use as directed  #21 x 0   Entered and Authorized by:   Syliva Overman MD   Signed by:   Syliva Overman MD on 11/14/2009   Method used:   Electronically to        [facility]  [address] (retail)       [address]       [place], [place]  [address]       Ph: [phone]       Fax: [phone]   RxID:   [id number]    Medication Administration  Injection # 1:    Medication: Ketorolac-Toradol 15mg     Diagnosis: BACK PAIN, CHRONIC (ICD-724.5)    Route: IM    Site: LUOQ gluteus    Exp Date: 06/2011  Lot #: [id number]    Mfr: hospira    Comments: 60 mg given     Patient tolerated injection without complications    Given by: Everitt Amber LPN (November 14, 2009 3:20 PM)  Orders Added: 1)  Est. Patient Level IV [99214] 2)  T-Vitamin D (25-Hydroxy) [id number] 3)  T-Wet Prep by Molecular Probe [[id number]] 4)  T-Chlamydia & GC Probe, Genital  [[id number]] 5)  Ketorolac-Toradol 15mg  [J1885] 6)  Admin of Therapeutic Inj  intramuscular or subcutaneous [65784]

## 2010-06-28 NOTE — Miscellaneous (Signed)
Summary: stool cards  Clinical Lists Changes  Observations: Added new observation of LAB RESULTS: 50101 9L 1/13 5030 5/14 Adella Hare LPN  April 04, 2010 4:58 PM  (04/04/2010 16:57)     Laboratory Results  Date/Time Received: April 04, 2010 4:57 PM  Date/Time Reported: April 04, 2010 4:57 PM   Stool - Occult Blood Hemmoccult #1: negative Hemoccult #2: negative Hemoccult #3: negative Comments: 50101 9L 1/13 5030 5/14 [facility] LPN  April 04, 2010 4:58 PM

## 2010-06-28 NOTE — Letter (Signed)
Summary: HANDICAPP CARD  HANDICAPP CARD   Imported By: Lind Guest 12/08/2009 13:38:50  _____________________________________________________________________  External Attachment:    Type:   Image     Comment:   External Document

## 2010-06-28 NOTE — Letter (Signed)
Summary: demo  demo   Imported By: Rudene Anda 10/04/2009 14:09:33  _____________________________________________________________________  External Attachment:    Type:   Image     Comment:   External Document

## 2010-06-28 NOTE — Progress Notes (Signed)
  Phone Note From Pharmacy   Summary of Call: OMEPRAZOLE REQUIRES PA Initial call taken by: Adella Hare LPN,  May 09, 2010 12:05 PM  Follow-up for Phone Call        pls check for pantoprazole 40mg  Take 1 capsule by mouth once a day #30 refill3, if goes throufgh ex[plain to pt why the change, If taht fails try dexilant 30mg  Take 1 capsule by mouth once a day #30 refill 3 Follow-up by: Syliva Overman MD,  May 09, 2010 12:19 PM  Additional Follow-up for Phone Call Additional follow up Details #1::        new med sent Additional Follow-up by: Adella Hare LPN,  May 24, 2010 10:21 AM    New/Updated Medications: PANTOPRAZOLE SODIUM 40 MG TBEC (PANTOPRAZOLE SODIUM) one tab by mouth once daily  replaces omeprazole Prescriptions: PANTOPRAZOLE SODIUM 40 MG TBEC (PANTOPRAZOLE SODIUM) one tab by mouth once daily  replaces omeprazole  #30 x 3   Entered by:   Adella Hare LPN   Authorized by:   Syliva Overman MD   Signed by:   Adella Hare LPN on 29/56/2130   Method used:   Electronically to        [facility]  [address] (retail)       [address]       [place], [place]  [address]       Ph: [phone]       Fax: [phone]   RxID:   [id number]

## 2010-06-28 NOTE — Progress Notes (Signed)
Summary: [facility]/ [facility]  [facility]/ [facility]   Imported By: Lind Guest 09/13/2009 08:37:35  _____________________________________________________________________  External Attachment:    Type:   Image     Comment:   External Document

## 2010-06-28 NOTE — Progress Notes (Signed)
Summary: swollen knee  Phone Note Call from Patient   Summary of Call: knee was really swollen and took motrin and came down alot. is there anything she needs to do different for this. [phone] Initial call taken by: Rudene Anda,  July 18, 2009 3:24 PM  Follow-up for Phone Call        please advise and schedule patient office visit with Dawn this week Follow-up by: Adella Hare LPN,  July 18, 2009 3:29 PM  Additional Follow-up for Phone Call Additional follow up Details #1::        pt has appt with Pa for 07/19/2009 10:00. pt notified  Additional Follow-up by: Rudene Anda,  July 18, 2009 5:01 PM

## 2010-06-28 NOTE — Letter (Signed)
Summary: phone notes  phone notes   Imported By: Rudene Anda 10/04/2009 14:27:10  _____________________________________________________________________  External Attachment:    Type:   Image     Comment:   External Document

## 2010-06-28 NOTE — Letter (Signed)
Summary: labs  labs   Imported By: Rudene Anda 10/04/2009 14:20:03  _____________________________________________________________________  External Attachment:    Type:   Image     Comment:   External Document

## 2010-06-28 NOTE — Progress Notes (Signed)
Summary: Dr. Patty Sermons appt.  Phone Note Outgoing Call   Call placed by: shannon Call placed to: Patient Summary of Call: called patient to advise her of her appt on Jun 04, 2010 at 10:00 with Dr. Karilyn Cota.  gave patient the number just in case she needed to reschedule. Initial call taken by: Curtis Sites,  May 08, 2010 9:20 AM

## 2010-06-28 NOTE — Progress Notes (Signed)
Summary: call to patient, cancelled ortho appointment  Phone Note Outgoing Call   Call placed to: Patient Summary of Call: Call made to patient to confirm orthopedic appointment with Dr Romeo Apple for tomorrow 08/24/09, per referral.  Patient cancelled appointment; states she had gotten an appointment elsewhere.   Initial call taken by: Cammie Sickle,  August 23, 2009 11:48 AM  Follow-up for Phone Call        noted Follow-up by: Syliva Overman MD,  August 23, 2009 12:23 PM

## 2010-06-28 NOTE — Progress Notes (Signed)
Phone Note Call from Patient   Summary of Call: left knee is still swollen and hurts worse. You told her to come back in 7-10 days if not better but she just wants to be referred to the Ortho doc if possible. If so, Lucio Edward can make the referral.  Patient states as soon as possible because she is in alot of pain Initial call taken by: Everitt Amber LPN,  July 26, 2009 4:25 PM  Follow-up for Phone Call        I had told her if it didn't get better she would need to see her ortho dr.  Rip Harbour to do a referral but she told me she had seen an ortho dr for this in the past & she would probably get into him faster than trying to refer to someone new since she's already established. Follow-up by: Esperanza Sheets PA,  July 26, 2009 4:58 PM  Additional Follow-up for Phone Call Additional follow up Details #1::        Called back, left message with family member to get her to call me back  Additional Follow-up by: Everitt Amber LPN,  July 27, 2009 8:43 AM    Additional Follow-up for Phone Call Additional follow up Details #2::    called again and family member said she would gether to call back Follow-up by: Everitt Amber LPN,  July 28, 2009 10:11 AM  Additional Follow-up for Phone Call Additional follow up Details #3:: Details for Additional Follow-up Action Taken: She said to do a new referral, She hasn't been to ortho in so long she doesn't know who it was. ******Wants morning appointment if possible****  [phone] cell   her sinuses is not getting any better, took a whole bottle of zyrtec and still stopped up, eyes are watering bad and she is sneezing all day. Taking Sudafed in the and Zyrtec. ALSO Wants a diflucan called in again because she is having symptoms again   Rx Bactrim DS #20 1 two times a day for her sinuses.  No refill.      Diflucan 150 mg #1 as dir.  No refill  Ok to refer pt to Ortho. Tell to elevate, rest & ice her knee over the weekend. Esperanza Sheets PA  July 28, 2009 11:44  AM   Additional Follow-up by: Everitt Amber LPN,  July 28, 2009 11:19 AM  New/Updated Medications: BACTRIM DS 800-160 MG TABS (SULFAMETHOXAZOLE-TRIMETHOPRIM) Take 1 tablet by mouth two times a day Prescriptions: FLUCONAZOLE 150 MG TABS (FLUCONAZOLE) one tab by mouth once daily prn  #1 x 0   Entered by:   Everitt Amber LPN   Authorized by:   Esperanza Sheets PA   Signed by:   Everitt Amber LPN on 88/41/6606   Method used:   Electronically to        [facility]  [address] (retail)       [address]       [place], [place]  [address]       Ph: [phone]       Fax: [phone]   RxID:   [id number] BACTRIM DS 800-160 MG TABS (SULFAMETHOXAZOLE-TRIMETHOPRIM) Take 1 tablet by mouth two times a day  #20 x 0   Entered by:   Everitt Amber LPN   Authorized by:   Esperanza Sheets PA   Signed by:   Everitt Amber LPN on 37/02/6268   Method  used:   Electronically to        [facility] (retail)       [address]       [place], [place]  [address]       Ph: [phone]       Fax: [phone]   RxID:   [id number]  meds sent in and patient aware

## 2010-06-28 NOTE — Letter (Signed)
Summary: office notes  office notes   Imported By: Rudene Anda 10/04/2009 14:16:29  _____________________________________________________________________  External Attachment:    Type:   Image     Comment:   External Document

## 2010-06-28 NOTE — Progress Notes (Signed)
Summary: surgery  Phone Note Call from Patient   Summary of Call: pt going to have surgery on knee and doesn't know date until tomorrow. pt wanted dr. Lodema Hong to know and needs refills on vicodin.  [phone] pt cancelled appt for today and to call her if doc needs to see her. she was only coming to tell her about surgery [phone] Initial call taken by: Rudene Anda,  September 05, 2009 11:29 AM  Follow-up for Phone Call        advised patient doctor treating knee will need to prescribe pain med, patient states vicodin is for her back and dr Lodema Hong writes, needs refill Follow-up by: Adella Hare LPN,  September 05, 2009 11:40 AM  Additional Follow-up for Phone Call Additional follow up Details #1::        I have prescribed vicodin for a long time for her, pls check pharmacy see if she is getting pain meds from any otherdoc ([facility]) specifically the ortho she sees, if not refill x1 , but let her understand that after surgery, ortho will write pain meds till she is relaeased if she will need a higher dose of pain meds than i write Additional Follow-up by: Syliva Overman MD,  September 05, 2009 12:33 PM    Additional Follow-up for Phone Call Additional follow up Details #2::    rx called in, called patient, left message Follow-up by: Adella Hare LPN,  September 05, 2009 1:58 PM  Additional Follow-up for Phone Call Additional follow up Details #3:: Details for Additional Follow-up Action Taken: patient aware Additional Follow-up by: Adella Hare LPN,  September 05, 2009 2:20 PM  Prescriptions: VICODIN ES 7.5-750 MG TABS (HYDROCODONE-ACETAMINOPHEN) Take 1 tab by mouth at bedtime as needed  #30 x 0   Entered by:   Adella Hare LPN   Authorized by:   Syliva Overman MD   Signed by:   Adella Hare LPN on 28/41/3244   Method used:   Telephoned to ...       [facility]  [address] [address] [facility] (retail)       [address]       [place], [place]  [address]       Ph: [phone]       Fax:  [phone]   RxID:   [id number]

## 2010-06-28 NOTE — Progress Notes (Signed)
Summary: MEDS & MRI  Phone Note Call from Patient   Summary of Call: [facility] DID NOT GET HER VICODIN REFILLED SAID THEY DID NOT RECIEVE IT AND ALSO SHE WANTS A MRI ON HER BACK  PAIN HAS INCREASED CALL BACK AT [phone] Initial call taken by: Lind Guest,  January 15, 2010 10:13 AM  Follow-up for Phone Call        [facility]  we saw this patient last two times she was here, can you order MRI? Follow-up by: Adella Hare LPN,  January 15, 2010 10:31 AM  Additional Follow-up for Phone Call Additional follow up Details #1::        I have not seen her regarding her back.  Will ask Dr Lodema Hong if she wants to auth MRI. Additional Follow-up by: Esperanza Sheets PA,  January 15, 2010 10:44 AM    Additional Follow-up for Phone Call Additional follow up Details #2::    needs appt re worsening back pain etc before MRI ordered, ok to refill the vicodin x 2 Follow-up by: Syliva Overman MD,  January 15, 2010 11:30 AM  Additional Follow-up for Phone Call Additional follow up Details #3:: Details for Additional Follow-up Action Taken: please have patient schedule appt, meds refilled Additional Follow-up by: Adella Hare LPN,  January 15, 2010 11:35 AM  called pt was called and is checking with supervisor to see when she can come in. said she would call back

## 2010-06-28 NOTE — Assessment & Plan Note (Signed)
Summary: BACK PAIN/ NAUSEA- ROOM 1   Vital Signs:  Patient profile:   45 year old female Menstrual status:  regular Height:      64 inches Weight:      185.75 pounds BMI:     32.00 O2 Sat:      98 % on Room air Pulse rate:   76 / minute Resp:     16 per minute BP sitting:   110 / 70  (left arm)  Vitals Entered By: Adella Hare LPN (January 19, 2010 9:05 AM) CC: back pain, nausea Is Patient Diabetic? No Pain Assessment Patient in pain? yes     Location: mid to lower back Intensity: 10 Type: throbbing Onset of pain  Constant Comments patient also complains of some nausea with eating did not bring meds to ov   Primary Abshir Paolini:  Syliva Overman, MD  CC:  back pain and nausea.  History of Present Illness: Pt presents today with c/o abd pain and bloating. Pain is cramping. Appetitie has been decreased. BMs normal now, had diarrhea x 2 days last weekend.  + indigestion.  took one of her moms Zantac and helped.  Was on omeprazole in the past, but has been off this x months.  She thought it might be her Ibuprofen and Vicoden upsetting her stomach so discontinued these recently, but no improvement of stomach syptoms.  Nausea after eating. Had an EGD few yrs ago due to stomach issues and had gastritis.  C/o LBP, midline x 2-3 weeks.  Pain 10 at times, scale 1-10.  Radiates down bilat buttocks.  Also having increased Lt knee pain and swelling. Had surgery recently on knee.  LS spine MRI 2/08 showed mild disc bulge at L3L4.  Lt knee continues to be painful and swollen since surgery March 2011.  She was unhappy with her orthopedic surgeon though and does not want to go back.  She did have a cortisone injection in the knee in July.      Allergies (verified): No Known Drug Allergies  Past History:  Past medical history reviewed for relevance to current acute and chronic problems.  Past Medical History: Reviewed history from 08/03/2008 and no changes required. Current Problems:    BACK PAIN, CHRONIC (ICD-724.5) NICOTINE ADDICTION (ICD-305.1) OBESITY, UNSPECIFIED (ICD-278.00) HEMORRHOIDS (ICD-455.6) H/O remote EGD greater than 10 years ago, gastritis  Review of Systems General:  Complains of loss of appetite; denies chills and fever. CV:  Denies chest pain or discomfort and palpitations. Resp:  Denies cough. GI:  Complains of abdominal pain, loss of appetite, and nausea; denies bloody stools, change in bowel habits, dark tarry stools, indigestion, and vomiting. MS:  Complains of joint pain, joint swelling, and low back pain; denies joint redness.  Physical Exam  General:  Well-developed,well-nourished,in no acute distress; alert,appropriate and cooperative throughout examination Head:  Normocephalic and atraumatic without obvious abnormalities. No apparent alopecia or balding. Ears:  External ear exam shows no significant lesions or deformities.  Otoscopic examination reveals clear canals, tympanic membranes are intact bilaterally without bulging, retraction, inflammation or discharge. Hearing is grossly normal bilaterally. Nose:  External nasal examination shows no deformity or inflammation. Nasal mucosa are pink and moist without lesions or exudates. Mouth:  Oral mucosa and oropharynx without lesions or exudates.  Teeth in good repair. Neck:  No deformities, masses, or tenderness noted. Lungs:  Normal respiratory effort, chest expands symmetrically. Lungs are clear to auscultation, no crackles or wheezes. Heart:  Normal rate and regular rhythm. S1 and  S2 normal without gallop, murmur, click, rub or other extra sounds. Abdomen:  soft, normal bowel sounds, no masses, no hepatomegaly, and no splenomegaly.  Diffuse TTP without guarding. Msk:  LS Spine:  normal ROM.  TTP lumbar paraspinal muscles and bilat SI joints.  Able to stand heels and toes.  Neg SLR.  Lt Knee:  decreased ROM.  + effusion of knee.   Pulses:  R posterior tibial normal, R dorsalis pedis normal, L  posterior tibial normal, and L dorsalis pedis normal.   Extremities:  No PTE. Neurologic:  alert & oriented X3, strength normal in all extremities, and gait normal.   Cervical Nodes:  No lymphadenopathy noted Psych:  Cognition and judgment appear intact. Alert and cooperative with normal attention span and concentration. No apparent delusions, illusions, hallucinations   Impression & Recommendations:  Problem # 1:  BACK PAIN, CHRONIC (ICD-724.5) Pt advised to hold the Ibuprofen due to recent stomach problems.  Her updated medication list for this problem includes:    Flexeril 10 Mg Tabs (Cyclobenzaprine hcl) ..... One tab by mouth two times a day prn    Ibuprofen 800 Mg Tabs (Ibuprofen) .Marland Kitchen... Take 1 tablet by mouth three  times a day for 10 days, then one twice daily as needed for pain    Vicodin Es 7.5-750 Mg Tabs (Hydrocodone-acetaminophen) .Marland Kitchen... Take 1 tab by mouth at bedtime as needed, no refill without ov  Orders: Urinalysis ([id number]) Physical Therapy Referral (PT)  Problem # 2:  KNEE PAIN, LEFT, ACUTE (ICD-719.46) Assessment: Deteriorated Pt advised to hold Ibuprofen due to recent stomach problems.  Rxd Voltaren gel to apply to knee.  Also continue icing. Will refer pt to another ortho for another opinion on knee.  She was dissatisfied with the ortho she was seeing.  Her updated medication list for this problem includes:    Flexeril 10 Mg Tabs (Cyclobenzaprine hcl) ..... One tab by mouth two times a day prn    Ibuprofen 800 Mg Tabs (Ibuprofen) .Marland Kitchen... Take 1 tablet by mouth three  times a day for 10 days, then one twice daily as needed for pain    Vicodin Es 7.5-750 Mg Tabs (Hydrocodone-acetaminophen) .Marland Kitchen... Take 1 tab by mouth at bedtime as needed, no refill without ov  Orders: Orthopedic Referral (Ortho)  Problem # 3:  GERD (ICD-530.81) Assessment: Deteriorated Hold Ibuprofen.  Restart Omeprazole. Discussed with the diarrhea she has last weekend she may also have a  stomach virus.  Recommended hold dairy prods x 2-3 days, increase clear fluids, and light bland diet.  Her updated medication list for this problem includes:    Omeprazole 20 Mg Cpdr (Omeprazole) ..... One cap by mouth qd  Complete Medication List: 1)  Valtrex 500 Mg Tabs (Valacyclovir hcl) .... Take 1 tablet by mouth once a day 2)  Womens One A Day  .... One by mouth daily 3)  Fish Oil  .... One by mouth daily 4)  Vitamin C  .... One by mouth daily 5)  Vitamin D  .... One by mouth daily 6)  Alprazolam 0.5 Mg Tabs (Alprazolam) .... Take 1/2 to 1 tab every 8 hrs as needed for anxiety 7)  Omeprazole 20 Mg Cpdr (Omeprazole) .... One cap by mouth qd 8)  Flexeril 10 Mg Tabs (Cyclobenzaprine hcl) .... One tab by mouth two times a day prn 9)  Ibuprofen 800 Mg Tabs (Ibuprofen) .... Take 1 tablet by mouth three  times a day for 10 days, then one twice daily as  needed for pain 10)  Vicodin Es 7.5-750 Mg Tabs (Hydrocodone-acetaminophen) .... Take 1 tab by mouth at bedtime as needed, no refill without ov 11)  Voltaren 1 % Gel (Diclofenac sodium) .... Apply to knee 4 times daily  Other Orders: Urine Pregnancy Test  (04540)  Patient Instructions: 1)  Keep your next appt. 2)  We will see you sooner if needed. 3)  I have ordered physical therapy for your back. 4)  I have referred you to Dr Romeo Apple for your knee. 5)  Discontinue Motrin.  I have prescribed Voltaren gel to use on your knee. 6)  I have refilled Flexeril for your back. 7)  I have refilled your Omeprazole for your stomach.  Take this once daily. Prescriptions: VOLTAREN 1 % GEL (DICLOFENAC SODIUM) apply to knee 4 times daily  #3 tubes x 2   Entered and Authorized by:   Esperanza Sheets PA   Signed by:   Esperanza Sheets PA on 01/19/2010   Method used:   Electronically to        [facility]  [address] (retail)       [address]       [place], [place]  [address]       Ph: [phone]       Fax: [phone]   RxID:    [id number] OMEPRAZOLE 20 MG CPDR (OMEPRAZOLE) one cap by mouth qd  #30 x 1   Entered and Authorized by:   Esperanza Sheets PA   Signed by:   Esperanza Sheets PA on 01/19/2010   Method used:   Electronically to        [facility]  [address] (retail)       [address]       [place], [place]  [address]       Ph: [phone]       Fax: [phone]   RxID:   [id number] FLEXERIL 10 MG TABS (CYCLOBENZAPRINE HCL) one tab by mouth two times a day prn  #40 x 1   Entered and Authorized by:   Esperanza Sheets PA   Signed by:   Esperanza Sheets PA on 01/19/2010   Method used:   Electronically to        [facility]  [address] (retail)       [address]       [place], [place]  [address]       Ph: [phone]       Fax: [phone]   RxID:   [id number]   Laboratory Results   Urine Tests  Date/Time Received: January 19, 2010 10:28 AM  Date/Time Reported: January 19, 2010 10:28 AM   Routine Urinalysis   Color: yellow Appearance: Clear Glucose: negative   (Normal Range: Negative) Bilirubin: negative   (Normal Range: Negative) Ketone: negative   (Normal Range: Negative) Spec. Gravity: 1.020   (Normal Range: 1.003-1.035) Blood: trace-intact   (Normal Range: Negative) pH: 6.5   (Normal Range: 5.0-8.0) Protein: negative   (Normal Range: Negative) Urobilinogen: 0.2   (Normal Range: 0-1) Nitrite: negative   (Normal Range: Negative) Leukocyte Esterace: negative   (Normal Range: Negative)    Urine HCG: negative

## 2010-06-28 NOTE — Progress Notes (Signed)
Summary: [facility]  [facility]   Imported By: Lind Guest 10/11/2009 09:59:33  _____________________________________________________________________  External Attachment:    Type:   Image     Comment:   External Document

## 2010-06-28 NOTE — Assessment & Plan Note (Signed)
Summary: left knee pain, sinus congestion- room 3   Vital Signs:  Patient profile:   45 year old female Menstrual status:  regular Height:      64 inches Weight:      190.75 pounds BMI:     32.86 O2 Sat:      100 % on Room air Pulse rate:   91 / minute Resp:     16 per minute BP sitting:   112 / 70  (left arm)  Vitals Entered By: Adella Hare LPN (July 19, 2009 9:38 AM)  Nutrition Counseling: Patient's BMI is greater than 25 and therefore counseled on weight management options. CC: left knee pain- sinus congestion Is Patient Diabetic? No Pain Assessment Patient in pain? yes     Location: left knee Intensity: 10 Type: throbbing Onset of pain  Constant   Primary Provider:  Syliva Overman, MD  CC:  left knee pain- sinus congestion.  History of Present Illness: Pt c/o Lt knee pain & swelling.  Hx of off & on with increased activity.  Has seen orthopod in past, but doesn't want surgery.  Had surgery on Rt knee in past.  She has been taking Ibupofen 800 mg twice a day & icing the knee few times daily & is improved, though not 100 %.  Pt also has sinus congestion & drainage x few days.  No chest congestion or cough.  No fever.  She has  tried some otc cold medications with minimal improv.   Current Medications (verified): 1)  Hydrocodone-Acetaminophen 5-500 Mg  Tabs (Hydrocodone-Acetaminophen) .... One Tab By Mouth Two Times A Day Prn 2)  Valtrex 500 Mg Tabs (Valacyclovir Hcl) .... Take 1 Tablet By Mouth Once A Day 3)  Ibuprofen 800 Mg Tabs (Ibuprofen) .... One Tab By Mouth Two Times A Day Prn 4)  Cyclobenzaprine Hcl 10 Mg Tabs (Cyclobenzaprine Hcl) .... Take One Tab By Mouth At Bedtime As Needed 5)  Furosemide 20 Mg Tabs (Furosemide) .... One Tab By Mouth Once Daily Prn 6)  Benedryl .... One By Mouth At Bedtime Prn 7)  Womens One A Day .... One By Mouth Daily 8)  Fish Oil .... One By Mouth Daily 9)  Vitamin C .... One By Mouth Daily 10)  Vitamin D .... One By Mouth  Daily 11)  Dulcolax .... One By Mouth As Needed With Use of Vicodin 12)  Nystatin 100000 Unit/gm Powd (Nystatin) .... Apply Two Times A Day To Affected Area 13)  Alprazolam 0.25 Mg Tabs (Alprazolam) .... Take 1 Tab By Mouth At Bedtime 14)  Phentermine Hcl 37.5 Mg Tabs (Phentermine Hcl) .... Take 1 Tablet By Mouth Once A Day 15)  Zyrtec Allergy 10 Mg Caps (Cetirizine Hcl) .... One Cap By Mouth Qd 16)  Ciprofloxacin Hcl 500 Mg Tabs (Ciprofloxacin Hcl) .... One Tab By Mouth Bid 17)  Fluconazole 150 Mg Tabs (Fluconazole) .... One Tab By Mouth Once Daily Prn 18)  Metronidazole 500 Mg Tabs (Metronidazole) .... Take 1 Tablet By Mouth Two Times A Day 19)  Omeprazole 20 Mg Cpdr (Omeprazole) .... One Cap By Mouth Qd 20)  Flexeril 10 Mg Tabs (Cyclobenzaprine Hcl) .... One Tab By Mouth Two Times A Day Prn 21)  Vicodin Es 7.5-750 Mg Tabs (Hydrocodone-Acetaminophen) .... Take 1 Tab By Mouth At Bedtime As Needed  Allergies (verified): No Known Drug Allergies  Past History:  Past medical, surgical, family and social histories (including risk factors) reviewed for relevance to current acute and chronic problems.  Past Medical History: Reviewed history from 08/03/2008 and no changes required. Current Problems:  BACK PAIN, CHRONIC (ICD-724.5) NICOTINE ADDICTION (ICD-305.1) OBESITY, UNSPECIFIED (ICD-278.00) HEMORRHOIDS (ICD-455.6) H/O remote EGD greater than 10 years ago, gastritis  Past Surgical History: Reviewed history from 08/03/2008 and no changes required. Right inguinal hernia repair (1982) Right Carpal tunnel release (1991) Right knee surgery (1993) BTL and endometrial ablation (2008) Cholecystectomy, Dr. Franky Macho, 2007  Family History: Reviewed history from 08/03/2008 and no changes required. Mother living - schizophrenic, scoliosis Father living - no known medical illness, h/o polyps 2 brothers - no known medical illness No FH of Colon Cancer:  Social History: Reviewed history  from 08/03/2008 and no changes required. Occupation: [employer] Single One child Current Smoker, 4-5 cigs/daily Alcohol use-occasional Drug use-no  Review of Systems General:  Denies chills and fever. ENT:  Complains of nasal congestion, postnasal drainage, and sinus pressure; denies earache and sore throat. CV:  Denies chest pain or discomfort. Resp:  Denies shortness of breath. MS:  Complains of joint pain and joint swelling; denies joint redness. Heme:  Denies enlarge lymph nodes and fevers.  Physical Exam  General:  Well-developed,well-nourished,in no acute distress; alert,appropriate and cooperative throughout examination Head:  Normocephalic and atraumatic without obvious abnormalities. No apparent alopecia or balding. Ears:  External ear exam shows no significant lesions or deformities.  Otoscopic examination reveals clear canals, tympanic membranes are intact bilaterally without bulging, retraction, inflammation or discharge. Hearing is grossly normal bilaterally. Nose:  External nasal examination shows no deformity or inflammation. Nasal mucosa are erythematous with mod swelling. Mouth:  Oral mucosa and oropharynx without lesions or exudates.  Teeth in good repair. Neck:  No deformities, masses, or tenderness noted. Lungs:  Normal respiratory effort, chest expands symmetrically. Lungs are clear to auscultation, no crackles or wheezes. Heart:  Normal rate and regular rhythm. S1 and S2 normal without gallop, murmur, click, rub or other extra sounds. Msk:  Lt knee - mild swelling noted compared to Rt.  Pt reports TTP with palp of medial & lateral joint lines.  No ligament instabiltiy.  Neg drawer. Extremities:  No clubbing, cyanosis, edema, or deformity noted with normal full range of motion of all joints.   Cervical Nodes:  No lymphadenopathy noted Psych:  Cognition and judgment appear intact. Alert and cooperative with normal attention span and concentration. No apparent  delusions, illusions, hallucinations    Impression & Recommendations:  Problem # 1:  KNEE PAIN, LEFT, ACUTE (ICD-719.46) Assessment New Recommed increase ibuprofen to tid. continue icing. If syptoms persist in 2 wks advised pt she will need to rtn to orthopod.  Her updated medication list for this problem includes:    Hydrocodone-acetaminophen 5-500 Mg Tabs (Hydrocodone-acetaminophen) ..... One tab by mouth two times a day prn    Ibuprofen 800 Mg Tabs (Ibuprofen) ..... One tab by mouth two times a day prn    Cyclobenzaprine Hcl 10 Mg Tabs (Cyclobenzaprine hcl) .Marland Kitchen... Take one tab by mouth at bedtime as needed    Flexeril 10 Mg Tabs (Cyclobenzaprine hcl) ..... One tab by mouth two times a day prn    Vicodin Es 7.5-750 Mg Tabs (Hydrocodone-acetaminophen) .Marland Kitchen... Take 1 tab by mouth at bedtime as needed  Problem # 2:  SINUSITIS, ACUTE (ICD-461.9) Assessment: New Continue Zyrtec daily.  May take Sudafed as needed for congestion.  The following medications were removed from the medication list:    Ciprofloxacin Hcl 500 Mg Tabs (Ciprofloxacin hcl) ..... One tab by mouth bid  Metronidazole 500 Mg Tabs (Metronidazole) .Marland Kitchen... Take 1 tablet by mouth two times a day Her updated medication list for this problem includes:    Amoxicillin 500 Mg Caps (Amoxicillin) .Marland Kitchen... 1 three times a day  Problem # 3:  OBESITY, UNSPECIFIED (ICD-278.00)  Ht: 64 (07/19/2009)   Wt: 190.75 (07/19/2009)   BMI: 32.86 (07/19/2009)  Complete Medication List: 1)  Hydrocodone-acetaminophen 5-500 Mg Tabs (Hydrocodone-acetaminophen) .... One tab by mouth two times a day prn 2)  Valtrex 500 Mg Tabs (Valacyclovir hcl) .... Take 1 tablet by mouth once a day 3)  Ibuprofen 800 Mg Tabs (Ibuprofen) .... One tab by mouth two times a day prn 4)  Cyclobenzaprine Hcl 10 Mg Tabs (Cyclobenzaprine hcl) .... Take one tab by mouth at bedtime as needed 5)  Furosemide 20 Mg Tabs (Furosemide) .... One tab by mouth once daily prn 6)   Benedryl  .... One by mouth at bedtime prn 7)  Womens One A Day  .... One by mouth daily 8)  Fish Oil  .... One by mouth daily 9)  Vitamin C  .... One by mouth daily 10)  Vitamin D  .... One by mouth daily 11)  Dulcolax  .... One by mouth as needed with use of vicodin 12)  Nystatin 100000 Unit/gm Powd (Nystatin) .... Apply two times a day to affected area 13)  Alprazolam 0.25 Mg Tabs (Alprazolam) .... Take 1 tab by mouth at bedtime 14)  Phentermine Hcl 37.5 Mg Tabs (Phentermine hcl) .... Take 1 tablet by mouth once a day 15)  Zyrtec Allergy 10 Mg Caps (Cetirizine hcl) .... One cap by mouth qd 16)  Fluconazole 150 Mg Tabs (Fluconazole) .... One tab by mouth once daily prn 17)  Omeprazole 20 Mg Cpdr (Omeprazole) .... One cap by mouth qd 18)  Flexeril 10 Mg Tabs (Cyclobenzaprine hcl) .... One tab by mouth two times a day prn 19)  Vicodin Es 7.5-750 Mg Tabs (Hydrocodone-acetaminophen) .... Take 1 tab by mouth at bedtime as needed 20)  Amoxicillin 500 Mg Caps (Amoxicillin) .Marland Kitchen.. 1 three times a day 21)  Fluconazole 150 Mg Tabs (Fluconazole) .Marland Kitchen.. 1 by mouth as needed yeast  Patient Instructions: 1)  Please schedule a follow-up appointment as needed. 2)  Get plenty of rest, drink lots of clear liquids, and use Tylenol or Ibuprofen for fever and comfort. Return in 7-10 days if you're not better:sooner if you're feeling worse. 3)  Continue to ice your knee. 4)  Increase your ibuprofen to three times a day until swelling improves. 5)  If your knee continues to bother you in 2 weeks you will need to see your orthopedic doctor. 6)  You need to lose weight. Consider a lower calorie diet and regular exercise.  Prescriptions: HYDROCODONE-ACETAMINOPHEN 5-500 MG  TABS (HYDROCODONE-ACETAMINOPHEN) one tab by mouth two times a day PRN  #60 x 0   Entered and Authorized by:   Esperanza Sheets PA   Signed by:   Esperanza Sheets PA on 07/19/2009   Method used:   Printed then faxed to ...       [facility]  [address] [facility]  (retail)       [address]       [place], [place]  [address]       Ph: [phone]       Fax: [phone]   RxID:   [id number] FLUCONAZOLE 150 MG TABS (FLUCONAZOLE) 1 by mouth as needed yeast  #1  x 0   Entered and Authorized by:   Esperanza Sheets PA   Signed by:   Esperanza Sheets PA on 07/19/2009   Method used:   Printed then faxed to ...       [facility]  [address] [facility] (retail)       [address]       [place], [place]  [address]       Ph: [phone]       Fax: [phone]   RxID:   [id number] AMOXICILLIN 500 MG CAPS (AMOXICILLIN) 1 three times a day  #30 x 0   Entered and Authorized by:   Esperanza Sheets PA   Signed by:   Esperanza Sheets PA on 07/19/2009   Method used:   Printed then faxed to ...       [facility]  [address] [facility] (retail)       [address]       [place], [place]  [address]       Ph: [phone]       Fax: [phone]   RxID:   [id number]

## 2010-06-28 NOTE — Letter (Signed)
Summary: Letter  Letter   Imported By: Lind Guest 03/21/2010 12:47:27  _____________________________________________________________________  External Attachment:    Type:   Image     Comment:   External Document

## 2010-06-28 NOTE — Assessment & Plan Note (Signed)
Summary: COUGH room 2   Vital Signs:  Patient profile:   45 year old female Menstrual status:  regular Height:      64 inches Weight:      181.75 pounds BMI:     31.31 O2 Sat:      99 % on Room air Pulse rate:   77 / minute Resp:     16 per minute BP sitting:   118 / 80  (left arm)  Vitals Entered By: Mauricia Area, CMA CC: follow up. Comments Did not bring meds   Primary Provider:  Syliva Overman, MD  CC:  follow up.Marland Kitchen  History of Present Illness: Pt presents today for nasal congestion, chest congstion and prod cough x 2 weeks.  Has been using over the counter cold and cough meds.  Phlegm dark colored, yellow. + fever and  chills.  Pt is also concerned she may have a UTI.  She has had urinary frequency the last couple of day.  No dysuria.  Continues to have knee pain.  Is awaiting records from [facility] before Dr Romeo Apple will schedule an appt.     Allergies (verified): No Known Drug Allergies  Past History:  Past medical history reviewed for relevance to current acute and chronic problems.  Past Medical History: Reviewed history from 08/03/2008 and no changes required. Current Problems:  BACK PAIN, CHRONIC (ICD-724.5) NICOTINE ADDICTION (ICD-305.1) OBESITY, UNSPECIFIED (ICD-278.00) HEMORRHOIDS (ICD-455.6) H/O remote EGD greater than 10 years ago, gastritis  Review of Systems General:  Complains of chills and fever. ENT:  Complains of nasal congestion; denies earache, sinus pressure, and sore throat. CV:  Denies chest pain or discomfort and palpitations. Resp:  Complains of cough and sputum productive; denies shortness of breath and wheezing. MS:  Complains of joint pain; denies joint swelling.  Physical Exam  General:  Well-developed,well-nourished,in no acute distress; alert,appropriate and cooperative throughout examination Head:  Normocephalic and atraumatic without obvious abnormalities. No apparent alopecia or balding. Ears:  External ear exam  shows no significant lesions or deformities.  Otoscopic examination reveals clear canals, tympanic membranes are intact bilaterally without bulging, retraction, inflammation or discharge. Hearing is grossly normal bilaterally. Nose:  External nasal examination shows no deformity or inflammation. Nasal mucosa are pink and moist without lesions or exudates. Mouth:  Oral mucosa and oropharynx without lesions or exudates.  Teeth in good repair. Neck:  No deformities, masses, or tenderness noted. Lungs:  Exp rhonci bilat bases.  normal respiratory effort, no crackles, and no wheezes.   Heart:  Normal rate and regular rhythm. S1 and S2 normal without gallop, murmur, click, rub or other extra sounds. Skin:  Intact without suspicious lesions or rashes Cervical Nodes:  No lymphadenopathy noted Psych:  Cognition and judgment appear intact. Alert and cooperative with normal attention span and concentration. No apparent delusions, illusions, hallucinations   Impression & Recommendations:  Problem # 1:  BRONCHITIS, ACUTE (ICD-466.0) Assessment New  Her updated medication list for this problem includes:    Azithromycin 250 Mg Tabs (Azithromycin) .Marland Kitchen... Take 2 tabs today then 1 daily x 4 days  Problem # 2:  FREQUENCY, URINARY (ICD-788.41) Assessment: Comment Only  Orders: UA Dipstick w/o Micro (automated)  (81003)  Complete Medication List: 1)  Valtrex 500 Mg Tabs (Valacyclovir hcl) .... Take 1 tablet by mouth once a day 2)  Womens One A Day  .... One by mouth daily 3)  Fish Oil  .... One by mouth daily 4)  Vitamin C  .... One  by mouth daily 5)  Vitamin D  .... One by mouth daily 6)  Alprazolam 0.5 Mg Tabs (Alprazolam) .... Take 1/2 to 1 tab every 8 hrs as needed for anxiety 7)  Pantoprazole Sodium 40 Mg Tbec (Pantoprazole sodium) .... Take 1 daily 8)  Flexeril 10 Mg Tabs (Cyclobenzaprine hcl) .... One tab by mouth two times a day prn 9)  Ibuprofen 800 Mg Tabs (Ibuprofen) .... Take 1 tablet by  mouth three  times a day for 10 days, then one twice daily as needed for pain 10)  Vicodin Es 7.5-750 Mg Tabs (Hydrocodone-acetaminophen) .... Take 1 tab by mouth at bedtime as needed, no refill without ov 11)  Voltaren 1 % Gel (Diclofenac sodium) .... Apply to knee 4 times daily 12)  Furosemide 20 Mg Tabs (Furosemide) .... Take 1 daily as needed for swelling 13)  Potassium Chloride Crys Cr 20 Meq Cr-tabs (Potassium chloride crys cr) .... Take 1 daily when take laxix (furosemide) 14)  Azithromycin 250 Mg Tabs (Azithromycin) .... Take 2 tabs today then 1 daily x 4 days 15)  Fluconazole 150 Mg Tabs (Fluconazole) .... Take one as needed for vaginal yeast infection  Other Orders: T-Comprehensive Metabolic Panel [id number]) T-Lipid Profile ([id number]) T-CBC No Diff ([id number]) T-TSH ([id number])  Patient Instructions: 1)  Get plenty of rest, drink lots of clear liquids, and use Tylenol or Ibuprofen for fever and comfort. Return in 7-10 days if you're not better:sooner if you're feeling worse. 2)  I have prescribed an antibiotic for you. 3)  Continue taking Mucinex DM as needed for cough. 4)  Tobacco is very bad for your health and your loved ones! You Should stop smoking!. 5)  Stop Smoking Tips: Choose a Quit date. Cut down before the Quit date. decide what you will do as a substitute when you feel the urge to smoke(gum,toothpick,exercise). Prescriptions: FLUCONAZOLE 150 MG TABS (FLUCONAZOLE) take one as needed for vaginal yeast infection  #1 x 0   Entered and Authorized by:   Esperanza Sheets PA   Signed by:   Esperanza Sheets PA on 03/08/2010   Method used:   Electronically to        [facility]  [address] (retail)       [address]       [place], [place]  [address]       Ph: [phone]       Fax: [phone]   RxID:   [id number] AZITHROMYCIN 250 MG TABS (AZITHROMYCIN) take 2 tabs today then 1 daily x 4 days  #6 x 0   Entered and Authorized by:   Esperanza Sheets PA    Signed by:   Esperanza Sheets PA on 03/08/2010   Method used:   Electronically to        [facility]  [address] (retail)       [address]       [place], [place]  [address]       Ph: [phone]       Fax: [phone]   RxID:   [id number]       Laboratory Results   Urine Tests    Routine Urinalysis   Color: yellow Appearance: Clear Glucose: negative   (Normal Range: Negative) Bilirubin: negative   (Normal Range: Negative) Ketone: negative   (Normal Range: Negative) Spec. Gravity: 1.020   (Normal Range: 1.003-1.035) Blood: trace-intact   (Normal Range:  Negative) pH: 6.0   (Normal Range: 5.0-8.0) Protein: negative   (Normal Range: Negative) Urobilinogen: 0.2   (Normal Range: 0-1) Nitrite: negative   (Normal Range: Negative) Leukocyte Esterace: negative   (Normal Range: Negative)

## 2010-06-28 NOTE — Letter (Signed)
Summary: Out of Work  [facility]  [address]   [place], [place] [address]   Phone: [phone]  Fax: [phone]    December 15, 2009   Employee:  SHANEESE TAIT    To Whom It May Concern:   For Medical reasons, please excuse the above named employee from work for the following dates:  Start:   12-13-09  End:   12-18-09 may return to work without restriction  If you need additional information, please feel free to contact our office.         Sincerely,    Esperanza Sheets PA

## 2010-06-28 NOTE — Progress Notes (Signed)
Summary: referral  Phone Note Call from Patient   Summary of Call: faxed over information to dr. Diamantina Providence office. they will schedule pt appt and call pt with time.  Initial call taken by: Rudene Anda,  July 28, 2009 2:25 PM

## 2010-06-28 NOTE — Letter (Signed)
Summary: Pap Smear, Normal Letter, [facility]  [address]   [place], [place] [address]   Phone: [phone]  Fax: [phone]          March 21, 2010    Dear: Forestine Na    I am pleased to notify you that your PAP smear was normal.  You will need your next PAP smear in:     ____ 3 Months    ____ 6 Months    ____ 12 Months    Please call the office at our office number above, to schedule your next appointment.    Sincerely,     [facility]

## 2010-06-28 NOTE — Letter (Signed)
Summary: WORK STATION  WORK STATION   Imported By: Lind Guest 01/26/2010 16:04:09  _____________________________________________________________________  External Attachment:    Type:   Image     Comment:   External Document

## 2010-06-28 NOTE — Letter (Signed)
Summary: xray  xray   Imported By: Rudene Anda 10/04/2009 14:21:53  _____________________________________________________________________  External Attachment:    Type:   Image     Comment:   External Document

## 2010-06-28 NOTE — Letter (Signed)
Summary: misc  misc   Imported By: Rudene Anda 10/04/2009 14:32:14  _____________________________________________________________________  External Attachment:    Type:   Image     Comment:   External Document

## 2010-06-28 NOTE — Progress Notes (Signed)
Summary: Medication  Phone Note Call from Patient   Summary of Call: Patient called in states that she still having problems with her sinus she wanted to know could you call something in @ [facility]  patient call be reach @ [phone] Initial call taken by: Eugenio Hoes,  April 30, 2010 4:13 PM  Follow-up for Phone Call        low grade fever off and on, chills, and body aches, clear nasal drainage, no appetitie, nausea Follow-up by: Adella Hare LPN,  April 30, 2010 5:02 PM  Additional Follow-up for Phone Call Additional follow up Details #1::        advised ov Additional Follow-up by: Adella Hare LPN,  April 30, 2010 5:04 PM

## 2010-06-28 NOTE — Progress Notes (Signed)
Summary: cough x 3weeks  Phone Note Call from Patient   Summary of Call: patient called in to give [place] an update on her cough.  She states she can feel her sinus's draining and she is still coughing up flem, x3 weeks or longer.  Initial call taken by: Curtis Sites,  March 28, 2010 2:12 PM  Follow-up for Phone Call        med has been sent in Follow-up by: Syliva Overman MD,  March 29, 2010 11:55 AM    New/Updated Medications: SEPTRA DS 800-160 MG TABS (SULFAMETHOXAZOLE-TRIMETHOPRIM) Take 1 tablet by mouth two times a day TESSALON PERLES 100 MG CAPS (BENZONATATE) Take 1 capsule by mouth three times a day Prescriptions: TESSALON PERLES 100 MG CAPS (BENZONATATE) Take 1 capsule by mouth three times a day  #21 x 0   Entered by:   Syliva Overman MD   Authorized by:   Adella Hare LPN   Signed by:   Syliva Overman MD on 03/29/2010   Method used:   Printed then faxed to ...       [facility]  [address] [facility] (retail)       [address]       [place], [place]  [address]       Ph: [phone]       Fax: [phone]   RxID:   [id number] SEPTRA DS 800-160 MG TABS (SULFAMETHOXAZOLE-TRIMETHOPRIM) Take 1 tablet by mouth two times a day  #20 x 0   Entered by:   Syliva Overman MD   Authorized by:   Adella Hare LPN   Signed by:   Syliva Overman MD on 03/29/2010   Method used:   Printed then faxed to ...       [facility]  [address] [facility] ([facility]ail)       [address]       [place], [place]  [address]       Ph: [phone]       Fax: [phone]   RxID:   [id number]   Appended Document: cough x 3weeks patient aware and meds faxed

## 2010-06-28 NOTE — Progress Notes (Signed)
Summary: nurse  Phone Note Call from Patient   Summary of Call: pt has a really bad cough. wants to know what she can use over the counter. [phone] Initial call taken by: Rudene Anda,  March 06, 2010 11:20 AM  Follow-up for Phone Call        advised otc delsym Follow-up by: Adella Hare LPN,  March 06, 2010 11:22 AM

## 2010-06-28 NOTE — Progress Notes (Signed)
Summary: REFERRAL  Phone Note Call from Patient   Summary of Call: WANTS TO KNOW CAN SHE BE REFFERRED TO [facility] OR SOMEON [facility] IT IS NOT UNTIL THE END OF THE MONTH AND HER KNEE IS SWELLING  PLEASE CALL BACK AT [phone] Initial call taken by: Lind Guest,  August 07, 2009 4:36 PM  Follow-up for Phone Call        pls refer to Dr Hilda Lias or ortho in [place] whoever she prefers for appt this week for swollen knee, verifythe knee left or right when referring pls and for how long Follow-up by: Syliva Overman MD,  August 07, 2009 5:36 PM  Additional Follow-up for Phone Call Additional follow up Details #1::        pt has appt with dr, Farris Has for 08/09/2009 10:00. pt notified  Additional Follow-up by: Rudene Anda,  August 08, 2009 9:38 AM
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
## 2010-06-28 NOTE — Letter (Signed)
Summary: history and physical  history and physical   Imported By: Rudene Anda 10/04/2009 14:12:15  _____________________________________________________________________  External Attachment:    Type:   Image     Comment:   External Document

## 2010-06-28 NOTE — Progress Notes (Signed)
  Phone Note Call from Patient   Caller: Patient Summary of Call: patient states she discussed a handicapped card with dr Lodema Hong and wants one now Initial call taken by: Adella Hare LPN,  December 05, 2009 10:51 AM  Follow-up for Phone Call        pls tell her to drop off a blank form Follow-up by: Syliva Overman MD,  December 05, 2009 12:11 PM  Additional Follow-up for Phone Call Additional follow up Details #1::        patient aware Additional Follow-up by: Lind Guest,  December 05, 2009 1:13 PM

## 2010-06-28 NOTE — Letter (Signed)
Summary: conslutation  conslutation   Imported By: Rudene Anda 10/04/2009 14:24:44  _____________________________________________________________________  External Attachment:    Type:   Image     Comment:   External Document

## 2010-06-28 NOTE — Progress Notes (Signed)
  Phone Note From Pharmacy   Caller: [facility]  [address] [facility] Summary of Call: omeprazole not covered by insurance Initial call taken by: Adella Hare LPN,  January 22, 2010 11:20 AM  Follow-up for Phone Call        I have prescribed Pantoprazole in place of Omeprazole.  If this is not covered she can by Omeprazole over the counter. Follow-up by: Esperanza Sheets PA,  January 22, 2010 12:05 PM  Additional Follow-up for Phone Call Additional follow up Details #1::        patient aware Additional Follow-up by: Adella Hare LPN,  January 22, 2010 12:11 PM    New/Updated Medications: PANTOPRAZOLE SODIUM 40 MG TBEC (PANTOPRAZOLE SODIUM) take 1 daily Prescriptions: PANTOPRAZOLE SODIUM 40 MG TBEC (PANTOPRAZOLE SODIUM) take 1 daily  #30 x 2   Entered and Authorized by:   Esperanza Sheets PA   Signed by:   Esperanza Sheets PA on 01/22/2010   Method used:   Electronically to        [facility]  [address] (retail)       [address]       [place], [place]  [address]       Ph: [phone]       Fax: [phone]   RxID:   [id number]

## 2010-06-28 NOTE — Progress Notes (Signed)
Summary: swollen legs  Phone Note Call from Patient   Summary of Call: needs to speak with nurse about both legs being swollen [phone]  asap Initial call taken by: Rudene Anda,  February 23, 2010 10:57 AM  Follow-up for Phone Call        still having problems with both legs swelling (from knees down) States she wanted lasix last time she was here on 8/26 but none was given. She hardly ever can wear shoes and where she sits all day at work, the swelling has been pretty bad. She tries to get up and walk around some but it doesn't go away. Wants to know if she can have the lasix now since [facility]'s [facility]    Follow-up by: Everitt Amber LPN,  February 23, 2010 11:09 AM  Additional Follow-up for Phone Call Additional follow up Details #1::        When she was in on 8/26 she reported swelling in her knee only. I have refilled the Lasix,and rxd a K+ pill to take with it.  If the swelling persists she will need an appt. Additional Follow-up by: Esperanza Sheets PA,  February 23, 2010 11:57 AM    Additional Follow-up for Phone Call Additional follow up Details #2::    Med sent, patient aware Follow-up by: Everitt Amber LPN,  February 23, 2010 1:24 PM  New/Updated Medications: FUROSEMIDE 20 MG TABS (FUROSEMIDE) take 1 daily as needed for swelling POTASSIUM CHLORIDE CRYS CR 20 MEQ CR-TABS (POTASSIUM CHLORIDE CRYS CR) take 1 daily when take Laxix (furosemide) Prescriptions: POTASSIUM CHLORIDE CRYS CR 20 MEQ CR-TABS (POTASSIUM CHLORIDE CRYS CR) take 1 daily when take Laxix (furosemide)  #30 x 0   Entered and Authorized by:   Esperanza Sheets PA   Signed by:   Esperanza Sheets PA on 02/23/2010   Method used:   Electronically to        [facility]  [address] (retail)       [address]       [place], [place]  [address]       Ph: [phone]       Fax: [phone]   RxID:   [id number] FUROSEMIDE 20 MG TABS (FUROSEMIDE) take 1 daily as needed for swelling  #30  x 0   Entered and Authorized by:   Esperanza Sheets PA   Signed by:   Esperanza Sheets PA on 02/23/2010   Method used:   Electronically to        [facility]  [address] (retail)       [address]       [place], [place]  [address]       Ph: [phone]       Fax: [phone]   RxID:   [id number]

## 2010-06-28 NOTE — Assessment & Plan Note (Signed)
Summary: ov   Vital Signs:  Patient profile:   45 year old female Menstrual status:  regular Height:      64 inches Weight:      184.75 pounds BMI:     31.83 O2 Sat:      98 % on Room air Pulse rate:   81 / minute Pulse rhythm:   regular Resp:     16 per minute BP sitting:   116 / 80  (left arm)  Vitals Entered By: Adella Hare LPN (May 02, 2010 8:55 AM)  Nutrition Counseling: Patient's BMI is greater than 25 and therefore counseled on weight management options.  O2 Flow:  Room air CC: head and chest congestion, sinus pressure, ear aches, body aches, chills, nausea Is Patient Diabetic? No Pain Assessment Patient in pain? no      Comments did not bring meds to ov, states list is correct   Primary Care Provider:  Syliva Overman, MD  CC:  head and chest congestion, sinus pressure, ear aches, body aches, chills, and nausea.  History of Present Illness: 3 week h/o uncontrolled nausea, feels as though she has phlegm in her throat and chest, cough present brown at time, no current fever or chill, but up to yesterday.  Feels food sticking, has had stringy stool, wonders if she has worms. Epigastric and upper quad pain, was told she has reflux, non compliant. Upper endoscopy about 10 yrs ago, colonscopy 2 yrs ago hemmorhoids.  Preventive Screening-Counseling & Management  Alcohol-Tobacco     Smoking Cessation Counseling: yes  Allergies (verified): No Known Drug Allergies  Review of Systems      See HPI General:  Complains of fatigue. Eyes:  Denies blurring and discharge. ENT:  Denies nasal congestion, sinus pressure, and sore throat. CV:  Denies chest pain or discomfort, palpitations, and swelling of feet. Resp:  Complains of cough; denies shortness of breath and wheezing. GI:  Complains of abdominal pain and nausea. GU:  Denies dysuria and urinary frequency. MS:  Complains of joint pain, low back pain, mid back pain, and stiffness. Neuro:  Denies headaches  and seizures. Psych:  Complains of anxiety; denies depression; mild anxiety. Endo:  Denies cold intolerance, excessive hunger, excessive thirst, and excessive urination. Heme:  Denies abnormal bruising and bleeding. Allergy:  Complains of seasonal allergies.  Physical Exam  General:  Well-developed,well-nourished,in no acute distress; alert,appropriate and cooperative throughout examination HEENT: No facial asymmetry,  EOMI, No sinus tenderness, TM's Clear, oropharynx  pink and moist.   Chest: Clear to auscultation bilaterally. Decreased air entry bilaterally, no wheezes. CVS: S1, S2, No murmurs, No S3.   Abd: Soft, epigastric tenderness,no guarding or rebound. No organomegaly or masses, bowel sounds normal MS: decreased ROM spine, h and knees. Adequate in hips and shoulders. Ext: No edema.   CNS: CN 2-12 intact, power tone and sensation normal throughout.   Skin: Intact, no visible lesions or rashes.  Psych: Good eye contact, normal affect.  Memory intact, not anxious or depressed appearing.    Impression & Recommendations:  Problem # 1:  NAUSEA (ICD-787.02) Assessment Deteriorated  Orders: Zofran 1mg . injection (Z6109) Gastroenterology Referral (GI)  Problem # 2:  GERD (ICD-530.81) Assessment: Deteriorated  Orders: Zofran 1mg . injection (U0454) Gastroenterology Referral (GI)  Her updated medication list for this problem includes:    Omeprazole 20 Mg Cpdr (Omeprazole) .Marland Kitchen... Take 1 capsule by mouth once a day    Prevpac Misc (Amoxicill-clarithro-lansopraz) ..... Use as directed  Problem # 3:  KNEE PAIN, LEFT, ACUTE (ICD-719.46) Assessment: Unchanged  Her updated medication list for this problem includes:    Flexeril 10 Mg Tabs (Cyclobenzaprine hcl) ..... One tab by mouth two times a day prn    Ibuprofen 800 Mg Tabs (Ibuprofen) .Marland Kitchen... Take 1 tablet by mouth three  times a day for 10 days, then one twice daily as needed for pain    Vicodin Es 7.5-750 Mg Tabs  (Hydrocodone-acetaminophen) .Marland Kitchen... Take 1 tab by mouth at bedtime as needed, no refill without ov  Problem # 4:  NICOTINE ADDICTION (ICD-305.1) Assessment: Unchanged  Encouraged smoking cessation and discussed different methods for smoking cessation.   Problem # 5:  BACK PAIN, CHRONIC (ICD-724.5) Assessment: Deteriorated  Her updated medication list for this problem includes:    Flexeril 10 Mg Tabs (Cyclobenzaprine hcl) ..... One tab by mouth two times a day prn    Ibuprofen 800 Mg Tabs (Ibuprofen) .Marland Kitchen... Take 1 tablet by mouth three  times a day for 10 days, then one twice daily as needed for pain    Vicodin Es 7.5-750 Mg Tabs (Hydrocodone-acetaminophen) .Marland Kitchen... Take 1 tab by mouth at bedtime as needed, no refill without ov  Orders: Depo- Medrol 80mg  (J1040) Ketorolac-Toradol 15mg  (O1308) Admin of Therapeutic Inj  intramuscular or subcutaneous (65784)  Complete Medication List: 1)  Valtrex 500 Mg Tabs (Valacyclovir hcl) .... Take 1 tablet by mouth once a day 2)  Womens One A Day  .... One by mouth daily 3)  Fish Oil  .... One by mouth daily 4)  Vitamin D  .... One by mouth daily 5)  Alprazolam 0.5 Mg Tabs (Alprazolam) .... Take 1/2 to 1 tab every 8 hrs as needed for anxiety 6)  Flexeril 10 Mg Tabs (Cyclobenzaprine hcl) .... One tab by mouth two times a day prn 7)  Ibuprofen 800 Mg Tabs (Ibuprofen) .... Take 1 tablet by mouth three  times a day for 10 days, then one twice daily as needed for pain 8)  Vicodin Es 7.5-750 Mg Tabs (Hydrocodone-acetaminophen) .... Take 1 tab by mouth at bedtime as needed, no refill without ov 9)  Voltaren 1 % Gel (Diclofenac sodium) .... Apply to knee 4 times daily 10)  Furosemide 20 Mg Tabs (Furosemide) .... Take 1 daily as needed for swelling 11)  Potassium Chloride Crys Cr 20 Meq Cr-tabs (Potassium chloride crys cr) .... Take 1 daily when take laxix (furosemide) 12)  Fluconazole 150 Mg Tabs (Fluconazole) .... Take 1 tablet by mouth once a day 13)   Proventil Hfa 108 (90 Base) Mcg/act Aers (Albuterol sulfate) .... One to two puffs at bedtime 14)  Promethazine Hcl 12.5 Mg Tabs (Promethazine hcl) .... Take 1 tablet by mouth once a day as needed for nausea 15)  Zyrtec  .... One table daily 16)  Omeprazole 20 Mg Cpdr (Omeprazole) .... Take 1 capsule by mouth once a day 17)  Prevpac Misc (Amoxicill-clarithro-lansopraz) .... Use as directed  Other Orders: TLB-H. Pylori Abs(Helicobacter Pylori) (86677-HELICO) T-Stool for O&P (69629-52841)  Patient Instructions: 1)  Please schedule a follow-up appointment in 4 months. 2)  Tobacco is very bad for your health and your loved ones! You Should stop smoking!. 3)  Stop Smoking Tips: Choose a Quit date. Cut down before the Quit date. decide what you will do as a substitute when you feel the urge to smoke(gum,toothpick,exercise). 4)  New meds for chronic cough. 5)  Injections today for knee and back pain, also for chronic cough. 6)  Hpylori test  today. 7)  You will be referred to dr Renae Fickle for eval. 8)  Pls submit stool for testing for worm O/P  Prescriptions: PREVPAC  MISC (AMOXICILL-CLARITHRO-LANSOPRAZ) Use as directed  #1 x 0   Entered and Authorized by:   Syliva Overman MD   Signed by:   Syliva Overman MD on 05/04/2010   Method used:   Historical   RxID:   [id number] OMEPRAZOLE 20 MG CPDR (OMEPRAZOLE) Take 1 capsule by mouth once a day  #30 x 1   Entered and Authorized by:   Syliva Overman MD   Signed by:   Syliva Overman MD on 05/02/2010   Method used:   Electronically to        [facility] [address] [facility]ail)       [address]       [place], [place]  [address]       Ph: [phone]       Fax: [phone]   RxID:   [id number] PROMETHAZINE HCL 12.5 MG TABS (PROMETHAZINE HCL) Take 1 tablet by mouth once a day as needed for nausea  #30 x 0   Entered and Authorized by:   Syliva Overman MD   Signed by:   Syliva Overman MD on 05/02/2010   Method used:    Electronically to        [facility] [address] [facility] ([facility]ail)       [address]       [place], [place]  [address]       Ph: [phone]       Fax: [phone]   RxID:   [id number] PROVENTIL HFA 108 (90 BASE) MCG/ACT AERS (ALBUTEROL SULFATE) one to two puffs at bedtime  #1 x 1   Entered and Authorized by:   Syliva Overman MD   Signed by:   Syliva Overman MD on 05/02/2010   Method used:   Electronically to        [facility]  [address] [facility] ([facility]ail)       [address]       [place], [place]  [address]       Ph: [phone]       Fax: [phone]   RxID:   [id number] FLUCONAZOLE 150 MG TABS (FLUCONAZOLE) Take 1 tablet by mouth once a day  #5 x 0   Entered and Authorized by:   Syliva Overman MD   Signed by:   Syliva Overman MD on 05/02/2010   Method used:   Electronically to        [facility] [facility] [address] [facility] ([facility]ail)       [address]       [place], [place]  [address]       Ph: [phone]       Fax: [phone]   RxID:   [id number]    Medication Administration  Injection # 1:    Medication: Depo- Medrol 80mg     Diagnosis: BACK PAIN, CHRONIC (ICD-724.5)    Route: IM    Site: RUOQ gluteus    Exp Date: 06/12    Lot #: [id number]    Mfr: Pharmacia    Patient tolerated injection without complications    Given by: Adella Hare LPN (May 02, 2010 10:40 AM)  Injection # 2:  Medication: Ketorolac-Toradol 15mg     Diagnosis: BACK PAIN, CHRONIC (ICD-724.5)    Route: IM    Site: LUOQ gluteus    Exp Date: 03/28/2011    Lot #: [id number]    Mfr: [email or web address]    Comments: toradol 60mg  given    Patient tolerated injection without complications    Given by: Adella Hare LPN (May 02, 2010 10:42 AM)  Injection # 3:    Medication: Zofran 1mg . injection    Diagnosis: NAUSEA (ICD-787.02)    Route: IM    Site: RUOQ gluteus    Exp Date: 04/13    Lot #: [id number]    Mfr: [email or web address]    Comments: zofran 4mg  given    Patient  tolerated injection without complications    Given by: Adella Hare LPN (May 02, 2010 10:43 AM)  Orders Added: 1)  Est. Patient Level IV [[address]] 2)  TLB-H. Pylori Abs(Helicobacter Pylori) [86677-HELICO] 3)  T-Stool for O&P [87177-70555] 4)  Depo- Medrol 80mg  [J1040] 5)  Ketorolac-Toradol 15mg  [J1885] 6)  Zofran 1mg . injection [J2405] 7)  Admin of Therapeutic Inj  intramuscular or subcutaneous [96372] 8)  Gastroenterology Referral [GI]      Medication Administration  Injection # 1:    Medication: Depo- Medrol 80mg     Diagnosis: BACK PAIN, CHRONIC (ICD-724.5)    Route: IM    Site: RUOQ gluteus    Exp Date: 06/12    Lot #: [id number]    Mfr: Pharmacia    Patient tolerated injection without complications    Given by: Adella Hare LPN (May 02, 2010 10:40 AM)  Injection # 2:    Medication: Ketorolac-Toradol 15mg     Diagnosis: BACK PAIN, CHRONIC (ICD-724.5)    Route: IM    Site: LUOQ gluteus    Exp Date: 03/28/2011    Lot #: [id number]    Mfr: [email or web address]    Comments: toradol 60mg  given    Patient tolerated injection without complications    Given by: Adella Hare LPN (May 02, 2010 10:42 AM)  Injection # 3:    Medication: Zofran 1mg . injection    Diagnosis: NAUSEA (ICD-787.02)    Route: IM    Site: RUOQ gluteus    Exp Date: 04/13    Lot #: [id number]    Mfr: [email or web address]    Comments: zofran 4mg  given    Patient tolerated injection without complications    Given by: Adella Hare LPN (May 02, 2010 10:43 AM)  Orders Added: 1)  Est. Patient Level IV [78469] 2)  TLB-H. Pylori Abs(Helicobacter Pylori) [86677-HELICO] 3)  T-Stool for O&P [87177-70555] 4)  Depo- Medrol 80mg  [J1040] 5)  Ketorolac-Toradol 15mg  [J1885] 6)  Zofran 1mg . injection [J2405] 7)  Admin of Therapeutic Inj  intramuscular or subcutaneous [96372] 8)  Gastroenterology Referral [GI]

## 2010-06-28 NOTE — Assessment & Plan Note (Signed)
Summary: physical   Vital Signs:  Patient profile:   45 year old female Menstrual status:  regular Height:      64 inches Weight:      181.75 pounds BMI:     31.31 O2 Sat:      100 % on Room air Pulse rate:   82 / minute Pulse rhythm:   regular Resp:     16 per minute BP sitting:   120 / 62  (left arm)  Vitals Entered By: Mauricia Area CMA (March 15, 2010 1:02 PM)  Nutrition Counseling: Patient's BMI is greater than 25 and therefore counseled on weight management options.  O2 Flow:  Room air CC: physical. chronic pain   Primary Care Jael Kostick:  Syliva Overman, MD  CC:  physical. chronic pain.  History of Present Illness: Reports  that she has been oing well. Denies recent fever or chills. Denies sinus pressure, nasal congestion , ear pain or sore throat. Denies chest congestion, or cough productive of sputum. Denies chest pain, palpitations, PND, orthopnea or leg swelling. Denies abdominal pain, nausea, vomitting, diarrhea or constipation. Denies change in bowel movements or bloody stool. Denies dysuria , frequency, incontinence or hesitancy. continues to smoke , though less, nop quit date set. Denies headaches, vertigo, seizures. Denies depression, anxiety or insomnia. Denies  rash, lesions, or itch.     Preventive Screening-Counseling & Management  Alcohol-Tobacco     Smoking Cessation Counseling: yes  Current Medications (verified): 1)  Valtrex 500 Mg Tabs (Valacyclovir Hcl) .... Take 1 Tablet By Mouth Once A Day 2)  Womens One A Day .... One By Mouth Daily 3)  Fish Oil .... One By Mouth Daily 4)  Vitamin D .... One By Mouth Daily 5)  Alprazolam 0.5 Mg Tabs (Alprazolam) .... Take 1/2 To 1 Tab Every 8 Hrs As Needed For Anxiety 6)  Flexeril 10 Mg Tabs (Cyclobenzaprine Hcl) .... One Tab By Mouth Two Times A Day Prn 7)  Ibuprofen 800 Mg Tabs (Ibuprofen) .... Take 1 Tablet By Mouth Three  Times A Day For 10 Days, Then One Twice Daily As Needed For Pain 8)   Vicodin Es 7.5-750 Mg Tabs (Hydrocodone-Acetaminophen) .... Take 1 Tab By Mouth At Bedtime As Needed, No Refill Without Ov 9)  Voltaren 1 % Gel (Diclofenac Sodium) .... Apply To Knee 4 Times Daily 10)  Furosemide 20 Mg Tabs (Furosemide) .... Take 1 Daily As Needed For Swelling 11)  Potassium Chloride Crys Cr 20 Meq Cr-Tabs (Potassium Chloride Crys Cr) .... Take 1 Daily When Take Laxix (Furosemide)  Allergies (verified): No Known Drug Allergies  Review of Systems      See HPI General:  Complains of fatigue; excessive amt of work. Eyes:  Denies discharge and double vision. Resp:  Complains of cough and sputum productive; completed z pack yesterday, sytill has brown sputum. GU:  Complains of discharge. MS:  Complains of joint pain, joint swelling, and stiffness; continued left knee pain , swelling andd stiffness with instability, despite rdcent arthroscopy, getting a 2nd opiniomn from Dr Romeo Apple. Endo:  Denies excessive thirst and excessive urination. Heme:  Denies abnormal bruising and bleeding. Allergy:  Complains of seasonal allergies; denies hives or rash and itching eyes.  Physical Exam  General:  Well-developed,well-nourished,in no acute distress; alert,appropriate and cooperative throughout examination Head:  Normocephalic and atraumatic without obvious abnormalities. No apparent alopecia or balding. Eyes:  No corneal or conjunctival inflammation noted. EOMI. Perrla. Funduscopic exam benign, without hemorrhages, exudates or papilledema. Vision grossly  normal. Ears:  External ear exam shows no significant lesions or deformities.  Otoscopic examination reveals clear canals, tympanic membranes are intact bilaterally without bulging, retraction, inflammation or discharge. Hearing is grossly normal bilaterally. Nose:  External nasal examination shows no deformity or inflammation. Nasal mucosa are pink and moist without lesions or exudates. Mouth:  Oral mucosa and oropharynx without lesions  or exudates.  Teeth in good repair. Neck:  No deformities, masses, or tenderness noted. Chest Wall:  No deformities, masses, or tenderness noted. Breasts:  No mass, nodules, thickening, tenderness, bulging, retraction, inflamation, nipple discharge or skin changes noted.   Lungs:  Normal respiratory effort, chest expands symmetrically. Lungs are clear to auscultation, no crackles or wheezes. Heart:  Normal rate and regular rhythm. S1 and S2 normal without gallop, murmur, click, rub or other extra sounds. Abdomen:  Bowel sounds positive,abdomen soft and non-tender without masses, organomegaly or hernias noted. Rectal:  No external abnormalities noted. Normal sphincter tone. internal hemmorhoids Genitalia:  Normal introitus for age, no external lesions, no vaginal discharge, mucosa pink and moist, no vaginal or cervical lesions, no vaginal atrophy, no friaility or hemorrhage, normal uterus size and position, no adnexal masses or tenderness Msk:  No deformity or scoliosis noted of thoracic or lumbar spine.   Pulses:  R and L carotid,radial,femoral,dorsalis pedis and posterior tibial pulses are full and equal bilaterally Extremities:  No clubbing, cyanosis, edema, or deformity noted withreduced ROM left knee Neurologic:  No cranial nerve deficits noted. Station and gait are normal. Plantar reflexes are down-going bilaterally. DTRs are symmetrical throughout. Sensory, motor and coordinative functions appear intact. Skin:  Intact without suspicious lesions or rashes Cervical Nodes:  No lymphadenopathy noted Axillary Nodes:  No palpable lymphadenopathy Inguinal Nodes:  No significant adenopathy Psych:  Cognition and judgment appear intact. Alert and cooperative with normal attention span and concentration. No apparent delusions, illusions, hallucinations   Impression & Recommendations:  Problem # 1:  PHYSICAL EXAMINATION (ICD-V70.0) Assessment Comment Only pap sent. Rectal exam revealks guaic neg  stool, and no mass  Problem # 2:  BACK PAIN, CHRONIC (ICD-724.5) Assessment: Unchanged  Her updated medication list for this problem includes:    Flexeril 10 Mg Tabs (Cyclobenzaprine hcl) ..... One tab by mouth two times a day prn    Ibuprofen 800 Mg Tabs (Ibuprofen) .Marland Kitchen... Take 1 tablet by mouth three  times a day for 10 days, then one twice daily as needed for pain    Vicodin Es 7.5-750 Mg Tabs (Hydrocodone-acetaminophen) .Marland Kitchen... Take 1 tab by mouth at bedtime as needed, no refill without ov  Problem # 3:  NICOTINE ADDICTION (ICD-305.1) Assessment: Unchanged  Orders: CXR- 2view (CXR)  Encouraged smoking cessation and discussed different methods for smoking cessation.   Problem # 4:  OBESITY, UNSPECIFIED (ICD-278.00) Assessment: Unchanged  Ht: 64 (03/15/2010)   Wt: 181.75 (03/15/2010)   BMI: 31.31 (03/15/2010) therapeutic lifestyle change discussed and encouraged  Complete Medication List: 1)  Valtrex 500 Mg Tabs (Valacyclovir hcl) .... Take 1 tablet by mouth once a day 2)  Womens One A Day  .... One by mouth daily 3)  Fish Oil  .... One by mouth daily 4)  Vitamin D  .... One by mouth daily 5)  Alprazolam 0.5 Mg Tabs (Alprazolam) .... Take 1/2 to 1 tab every 8 hrs as needed for anxiety 6)  Flexeril 10 Mg Tabs (Cyclobenzaprine hcl) .... One tab by mouth two times a day prn 7)  Ibuprofen 800 Mg Tabs (Ibuprofen) .... Take  1 tablet by mouth three  times a day for 10 days, then one twice daily as needed for pain 8)  Vicodin Es 7.5-750 Mg Tabs (Hydrocodone-acetaminophen) .... Take 1 tab by mouth at bedtime as needed, no refill without ov 9)  Voltaren 1 % Gel (Diclofenac sodium) .... Apply to knee 4 times daily 10)  Furosemide 20 Mg Tabs (Furosemide) .... Take 1 daily as needed for swelling 11)  Potassium Chloride Crys Cr 20 Meq Cr-tabs (Potassium chloride crys cr) .... Take 1 daily when take laxix (furosemide) 12)  Fluconazole 150 Mg Tabs (Fluconazole) .... Take 1 tablet by mouth once a  day as needed for vaginal itching  .  Other Orders: Pap Smear (16109)  Patient Instructions: 1)  Please schedule a follow-up appointment in 4 months. 2)  Tobacco is very bad for your health and your loved ones! You Should stop smoking!. 3)  Stop Smoking Tips: Choose a Quit date. Cut down before the Quit date. decide what you will do as a substitute when you feel the urge to smoke(gum,toothpick,exercise). 4)  It is important that you exercise regularly at least 20 minutes 5 times a week. If you develop chest pain, have severe difficulty breathing, or feel very tired , stop exercising immediately and seek medical attention. 5)  You need to lose weight. Consider a lower calorie diet and regular exercise.  6)  cXR today, 7)  [place] as sceduled. 8)  No med changes. 9)  pls call back if cough or sputum persist Prescriptions: VALTREX 500 MG TABS (VALACYCLOVIR HCL) Take 1 tablet by mouth once a day  #30 Each x 4   Entered by:   Adella Hare LPN   Authorized by:   Syliva Overman MD   Signed by:   Adella Hare LPN on 60/45/4098   Method used:   Electronically to        [facility]  [address] [address] [facility] (retail)       [address]       [place], [place]  [address]       Ph: [phone]       Fax: [phone]   RxID:   [id number] FLUCONAZOLE 150 MG TABS (FLUCONAZOLE) Take 1 tablet by mouth once a day as needed for vaginal itching  .  #3 x 0   Entered and Authorized by:   Syliva Overman MD   Signed by:   Syliva Overman MD on 03/15/2010   Method used:   Electronically to        [facility] (retail)       [address]       [place], [place]  [address]       Ph: [phone]       Fax: [phone]   RxID:   [id number]    Orders Added: 1)  Est. Patient 40-64 years [99396] 2)  CXR- 2view [CXR] 3)  Pap Smear [10932]

## 2010-06-28 NOTE — Progress Notes (Signed)
Summary: rx  Phone Note Call from Patient   Summary of Call: needs a rx for econic desk and chair for her back  the company will supply for employess  call back at  [phone] or  [phone] Initial call taken by: Lind Guest,  January 25, 2010 8:41 AM  Follow-up for Phone Call        Pt can pick up prescription. Follow-up by: Esperanza Sheets PA,  January 25, 2010 12:02 PM  Additional Follow-up for Phone Call Additional follow up Details #1::        Phone Call Completed Additional Follow-up by: Adella Hare LPN,  January 25, 2010 1:36 PM    +

## 2010-06-28 NOTE — Progress Notes (Signed)
Summary: Anna Cantu   Imported By: Lind Guest 11/16/2009 11:47:21  _____________________________________________________________________  External Attachment:    Type:   Image     Comment:   External Document

## 2010-06-28 NOTE — Progress Notes (Signed)
Summary: please advise  Phone Note Call from Patient   Summary of Call: patient states she just dropped off a specium of her flem, she isn't sure that it was a very good one.  She wanted to know what she could take over the counter for her to take she has already taken a whole thing of muscinex.  She doesn't know how long it will take for the cultrure to come back.  Please advise. Initial call taken by: Curtis Sites,  March 29, 2010 8:53 AM  Follow-up for Phone Call        pls let pt know antribiotic has been senmt in forher congestion, pls fax after you spk with her i am also sending in decongestants Follow-up by: Syliva Overman MD,  March 29, 2010 11:55 AM  Additional Follow-up for Phone Call Additional follow up Details #1::        patient aware Additional Follow-up by: Mauricia Area CMA,  March 29, 2010 12:56 PM

## 2010-07-02 ENCOUNTER — Ambulatory Visit: Payer: Self-pay | Admitting: Family Medicine

## 2010-07-02 ENCOUNTER — Telehealth: Payer: Self-pay | Admitting: Family Medicine

## 2010-07-02 ENCOUNTER — Institutional Professional Consult (permissible substitution) (INDEPENDENT_AMBULATORY_CARE_PROVIDER_SITE_OTHER): Payer: Self-pay | Admitting: Internal Medicine

## 2010-07-03 ENCOUNTER — Telehealth: Payer: Self-pay | Admitting: Family Medicine

## 2010-07-03 ENCOUNTER — Encounter: Payer: Self-pay | Admitting: Family Medicine

## 2010-07-03 ENCOUNTER — Ambulatory Visit (INDEPENDENT_AMBULATORY_CARE_PROVIDER_SITE_OTHER): Payer: Private Health Insurance - Indemnity

## 2010-07-03 DIAGNOSIS — R609 Edema, unspecified: Secondary | ICD-10-CM | POA: Insufficient documentation

## 2010-07-03 DIAGNOSIS — M25569 Pain in unspecified knee: Secondary | ICD-10-CM

## 2010-07-04 NOTE — Progress Notes (Signed)
Summary: refill  Phone Note Call from Patient   Summary of Call: needs a rx called into [facility] and [place] of town.  yeast pill [phone] Initial call taken by: Rudene Anda,  June 26, 2010 10:41 AM    Prescriptions: FLUCONAZOLE 150 MG TABS (FLUCONAZOLE) Take 1 tablet by mouth once a day  #3 x 0   Entered by:   Adella Hare LPN   Authorized by:   Syliva Overman MD   Signed by:   Adella Hare LPN on 78/46/9629   Method used:   Printed then faxed to ...       [facility] [address] [facility] ([facility]ail)       [address]       [place], [place]  [address]       Ph: [phone]       Fax: [phone]   RxID:   [id number]

## 2010-07-05 ENCOUNTER — Telehealth: Payer: Self-pay | Admitting: Family Medicine

## 2010-07-12 NOTE — Assessment & Plan Note (Signed)
Summary: injection  Nurse Visit   Vital Signs:  Patient profile:   45 year old female Menstrual status:  regular Weight:      186.75 pounds BP sitting:   110 / 64  (left arm) CC: bilateral knee pain but swelling in left knee and leg   Allergies: No Known Drug Allergies  Medication Administration  Injection # 1:    Medication: Ketorolac-Toradol 15mg     Diagnosis: KNEE PAIN, LEFT, ACUTE (ICD-719.46)    Route: IM    Site: LUOQ gluteus    Exp Date: 10/26/2011    Lot #: [id number]    Mfr: [email or web address]    Comments: toradol 60mg  given    Patient tolerated injection without complications    Given by: Adella Hare LPN (July 03, 2010 4:25 PM)  Injection # 2:    Medication: Furosemide- Lasix Injection    Diagnosis: EDEMA LEG (ICD-782.3)    Route: IM    Site: RUOQ gluteus    Exp Date: 07/26/2010    Lot #: [id number]    Mfr: [email or web address]    Patient tolerated injection without complications    Given by: Adella Hare LPN (July 03, 2010 4:26 PM)  Orders Added: 1)  Ketorolac-Toradol 15mg  [J1885] 2)  Furosemide- Lasix Injection [J1940] 3)  Admin of Therapeutic Inj  intramuscular or subcutaneous [96372]   Medication Administration  Injection # 1:    Medication: Ketorolac-Toradol 15mg     Diagnosis: KNEE PAIN, LEFT, ACUTE (ICD-719.46)    Route: IM    Site: LUOQ gluteus    Exp Date: 10/26/2011    Lot #: [id number]    Mfr: [email or web address]    Comments: toradol 60mg  given    Patient tolerated injection without complications    Given by: Adella Hare LPN (July 03, 2010 4:25 PM)  Injection # 2:    Medication: Furosemide- Lasix Injection    Diagnosis: EDEMA LEG (ICD-782.3)    Route: IM    Site: RUOQ gluteus    Exp Date: 07/26/2010    Lot #: [id number]    Mfr: [email or web address]    Patient tolerated injection without complications    Given by: Adella Hare LPN (July 03, 2010 4:26 PM)  Orders Added: 1)  Ketorolac-Toradol 15mg  [J1885] 2)  Furosemide- Lasix Injection [J1940] 3)  Admin of  Therapeutic Inj  intramuscular or subcutaneous [96372] noted and agree

## 2010-07-12 NOTE — Progress Notes (Signed)
Summary: medicine  Phone Note Call from Patient   Summary of Call: call in rx for yeast infection. to [facility] in [place]  [phone]  what can she take for swelling in both legs.  Initial call taken by: Rudene Anda,  July 05, 2010 4:10 PM  Follow-up for Phone Call        lasix and potassium entered historicall, pls send after you spk wioth her , for as needed use only.  OTC moonistat is ezxcellent for yeast , she may use this if fails, needs ov Follow-up by: Syliva Overman MD,  July 06, 2010 8:05 AM  Additional Follow-up for Phone Call Additional follow up Details #1::        Patient aware Additional Follow-up by: Everitt Amber LPN,  July 06, 2010 11:52 AM    New/Updated Medications: FUROSEMIDE 20 MG TABS (FUROSEMIDE) Take 1 tablet by mouth once a day as needed for leg swelling KLOR-CON M20 20 MEQ CR-TABS (POTASSIUM CHLORIDE CRYS CR) Take 1 tablet by mouth once a day as needed when furosemide is taken for leg swelling Prescriptions: FLUCONAZOLE 150 MG TABS (FLUCONAZOLE) Take 1 tablet by mouth once a day  #3 x 0   Entered by:   Everitt Amber LPN   Authorized by:   Syliva Overman MD   Signed by:   Everitt Amber LPN on 45/40/9811   Method used:   Electronically to        [facility]  [address] (retail)       [address]       [place], [place]  [address]       Ph: [phone]       Fax: [phone]   RxID:   [id number] KLOR-CON M20 20 MEQ CR-TABS (POTASSIUM CHLORIDE CRYS CR) Take 1 tablet by mouth once a day as needed when furosemide is taken for leg swelling  #30 x 1   Entered by:   Everitt Amber LPN   Authorized by:   Syliva Overman MD   Signed by:   Everitt Amber LPN on 66/44/0347   Method used:   Electronically to        [facility]  [address] (retail)       [address]       [place], [place]  [address]       Ph: [phone]       Fax: [phone]   RxID:   [id number] KLOR-CON M20 20 MEQ CR-TABS (POTASSIUM CHLORIDE  CRYS CR) Take 1 tablet by mouth once a day as needed when furosemide is taken for leg swelling  #30 x 1   Entered and Authorized by:   Syliva Overman MD   Signed by:   Syliva Overman MD on 07/06/2010   Method used:   Historical   RxID:   [id number] FUROSEMIDE 20 MG TABS (FUROSEMIDE) Take 1 tablet by mouth once a day as needed for leg swelling  #30 x 1   Entered and Authorized by:   Syliva Overman MD   Signed by:   Syliva Overman MD on 07/06/2010   Method used:   Electronically to        [facility] [address] [facility] (retail)       [address]       [place], [place]  [address]  Ph: [phone]       Fax: [phone]   RxID:   [id number]

## 2010-07-12 NOTE — Progress Notes (Signed)
  Phone Note Call from Patient   Summary of Call: Legs and knees are swollen and hurts to walk on her feet, she is in [place]. Advised if she couldn't get here to be seen she needed to go to urgent care. She said she was going to take some of her as needed furosemide but she didn't want to b/c she would have to be pulling over alot on the drive home. Advised her to call back when she got home to see if there had been any cancellations. If not, she would have to go to urgent care. patient understands Initial call taken by: Everitt Amber LPN,  July 02, 2010 10:30 AM

## 2010-07-12 NOTE — Progress Notes (Signed)
Summary: injection  Phone Note Call from Patient   Summary of Call: wants a shot for her knee and her back call back at [phone] Initial call taken by: Lind Guest,  July 03, 2010 11:26 AM  Follow-up for Phone Call        See previous phone message regarding her legs. She had been in [employer] and her legs and knees have been swollen. Was wanting an app but there was none so now she wants to know if she can come in for nurse visit Follow-up by: Everitt Amber LPN,  July 03, 2010 11:31 AM  Additional Follow-up for Phone Call Additional follow up Details #1::        ok to give toradol 60 mg im for joint pain, if she also has leg swelling ok to give lasix 10mg IM x 1 dose only, nurse visit today Additional Follow-up by: Syliva Overman MD,  July 03, 2010 11:36 AM    Additional Follow-up for Phone Call Additional follow up Details #2::    patient has app for nurse visit at 4:00 today  Follow-up by: Everitt Amber LPN,  July 03, 2010 3:24 PM

## 2010-07-20 ENCOUNTER — Encounter: Payer: Self-pay | Admitting: Family Medicine

## 2010-07-20 ENCOUNTER — Ambulatory Visit (INDEPENDENT_AMBULATORY_CARE_PROVIDER_SITE_OTHER): Payer: Private Health Insurance - Indemnity | Admitting: Family Medicine

## 2010-07-20 ENCOUNTER — Other Ambulatory Visit: Payer: Self-pay | Admitting: Family Medicine

## 2010-07-20 DIAGNOSIS — E669 Obesity, unspecified: Secondary | ICD-10-CM

## 2010-07-20 DIAGNOSIS — F17200 Nicotine dependence, unspecified, uncomplicated: Secondary | ICD-10-CM

## 2010-07-20 DIAGNOSIS — I739 Peripheral vascular disease, unspecified: Secondary | ICD-10-CM

## 2010-07-20 DIAGNOSIS — R609 Edema, unspecified: Secondary | ICD-10-CM

## 2010-07-24 ENCOUNTER — Ambulatory Visit (HOSPITAL_COMMUNITY): Payer: Private Health Insurance - Indemnity

## 2010-07-24 NOTE — Letter (Signed)
Summary: Letter  Letter   Imported By: Lind Guest 07/20/2010 14:41:13  _____________________________________________________________________  External Attachment:    Type:   Image     Comment:   External Document

## 2010-07-30 ENCOUNTER — Encounter: Payer: Self-pay | Admitting: Family Medicine

## 2010-08-01 ENCOUNTER — Telehealth: Payer: Self-pay | Admitting: Family Medicine

## 2010-08-02 NOTE — Assessment & Plan Note (Signed)
Summary: F UP   Vital Signs:  Patient profile:   45 year old female Menstrual status:  regular Height:      64 inches Weight:      180.25 pounds BMI:     31.05 O2 Sat:      96 % Pulse rate:   96 / minute Pulse rhythm:   regular Resp:     16 per minute BP sitting:   120 / 78  (left arm) Cuff size:   large  Vitals Entered By: Everitt Amber LPN (July 20, 2010 8:19 AM)  Nutrition Counseling: Patient's BMI is greater than 25 and therefore counseled on weight management options. CC: c/o both knees hurting and left one swelling since she had surgery in may   Primary Care Provider:  Syliva Overman, MD  CC:  c/o both knees hurting and left one swelling since she had surgery in may.  History of Present Illness: Reports  that she has not been doing well.  Denies recent fever or chills. Denies sinus pressure, nasal congestion , ear pain or sore throat. Denies chest congestion, or cough productive of sputum. Denies chest pain, palpitations, PND, orthopnea or leg swelling. Denies abdominal pain, nausea, vomitting, diarrhea or constipation.' Denies change in bowel movements or bloody stool. Denies dysuria , frequency, incontinence or hesitancy.  Denies headaches, vertigo, seizures.  Denies  rash, lesions, or itch.     Preventive Screening-Counseling & Management  Alcohol-Tobacco     Smoking Cessation Counseling: yes  Allergies: No Known Drug Allergies  Review of Systems      See HPI General:  Complains of fatigue. Eyes:  Denies discharge, eye pain, and red eye. MS:  Complains of joint pain, low back pain, mid back pain, and stiffness; chronic back pain, has to sit for prolonged periods , requesting letter to walk, also c/o leg cramps, concerned about the possibility of poor circulation and wants to be tested. Neuro:  Complains of tingling; c./o increased leg cramps. Psych:  Complains of anxiety, depression, and mental problems; denies suicidal thoughts/plans, thoughts of  violence, and unusual visions or sounds; increaasd anxiety surrounding her son's heal;th, he was injured while in the [employer], she has poor relationships with her daughterin law. Endo:  Denies cold intolerance, excessive hunger, excessive thirst, and excessive urination. Heme:  Denies abnormal bruising, bleeding, enlarge lymph nodes, and fevers. Allergy:  Complains of seasonal allergies; denies hives or rash and itching eyes.  Cramps/spasms in feet egularly  Physical Exam  General:  Well-developed,well-nourished,in no acute distress; alert,appropriate and cooperative throughout examination HEENT: No facial asymmetry,  EOMI, No sinus tenderness, TM's Clear, oropharynx  pink and moist.   Chest: Clear to auscultation bilaterally.  CVS: S1, S2, No murmurs, No S3.   Abd: Soft, Nontender.  MS: decreased ROM spine,  and knees, esp the left  Ext: No edema.   CNS: CN 2-12 intact, power tone and sensation normal throughout.   Skin: Intact, no visible lesions or rashes.  Psych: Good eye contact, normal affect.  Memory intact, mildly anxious and depressed appearing, tearful at times   Impression & Recommendations:  Problem # 1:  PVD (ICD-443.9) Assessment Comment Only  Orders: Radiology Referral (Radiology)  Problem # 2:  ANXIETY, SITUATIONAL (ICD-308.3) Assessment: Deteriorated  Problem # 3:  KNEE PAIN, LEFT, ACUTE (ICD-719.46) Assessment: Deteriorated  Her updated medication list for this problem includes:    Flexeril 10 Mg Tabs (Cyclobenzaprine hcl) ..... One tab by mouth two times a day prn  Ibuprofen 800 Mg Tabs (Ibuprofen) .Marland Kitchen... Take 1 tablet by mouth three  times a day for 10 days, then one twice daily as needed for pain    Vicodin Es 7.5-750 Mg Tabs (Hydrocodone-acetaminophen) .Marland Kitchen... Take 1 tab by mouth at bedtime as needed, no refill without ov letter of excuse for sitting for prolonged periods written  Problem # 4:  NICOTINE ADDICTION (ICD-305.1) Assessment:  Unchanged  Encouraged smoking cessation and discussed different methods for smoking cessation.   Problem # 5:  OBESITY, UNSPECIFIED (ICD-278.00) Assessment: Improved  Ht: 64 (07/20/2010)   Wt: 180.25 (07/20/2010)   BMI: 31.05 (07/20/2010)  Complete Medication List: 1)  Valtrex 500 Mg Tabs (Valacyclovir hcl) .... Take 1 tablet by mouth once a day 2)  Womens One A Day  .... One by mouth daily 3)  Fish Oil  .... One by mouth daily 4)  Vitamin D  .... One by mouth daily 5)  Alprazolam 0.5 Mg Tabs (Alprazolam) .... Take 1/2 to 1 tab every 8 hrs as needed for anxiety 6)  Flexeril 10 Mg Tabs (Cyclobenzaprine hcl) .... One tab by mouth two times a day prn 7)  Ibuprofen 800 Mg Tabs (Ibuprofen) .... Take 1 tablet by mouth three  times a day for 10 days, then one twice daily as needed for pain 8)  Vicodin Es 7.5-750 Mg Tabs (Hydrocodone-acetaminophen) .... Take 1 tab by mouth at bedtime as needed, no refill without ov 9)  Voltaren 1 % Gel (Diclofenac sodium) .... Apply to knee 4 times daily 10)  Furosemide 20 Mg Tabs (Furosemide) .... Take 1 daily as needed for swelling 11)  Fluconazole 150 Mg Tabs (Fluconazole) .... Take 1 tablet by mouth once a day 12)  Proventil Hfa 108 (90 Base) Mcg/act Aers (Albuterol sulfate) .... One to two puffs at bedtime 13)  Promethazine Hcl 12.5 Mg Tabs (Promethazine hcl) .... Take 1 tablet by mouth once a day as needed for nausea 14)  Zyrtec  .... One table daily 15)  Omeprazole 20 Mg Cpdr (Omeprazole) .... Take 1 capsule by mouth once a day 16)  Prevpac Misc (Amoxicill-clarithro-lansopraz) .... Use as directed 17)  Pantoprazole Sodium 40 Mg Tbec (Pantoprazole sodium) .... One tab by mouth once daily  replaces omeprazole 18)  Furosemide 20 Mg Tabs (Furosemide) .... Take 1 tablet by mouth once a day as needed for leg swelling 19)  Klor-con M20 20 Meq Cr-tabs (Potassium chloride crys cr) .... Take 1 tablet by mouth two times a day , dose increase effective  07/19/2010  Other Orders: Psychology Referral (Psychology)  Patient Instructions: 1)  Please schedule a follow-up appointment in 4 months. 2)  It is important that you exercise regularly at least 20 minutes 5 times a week. If you develop chest pain, have severe difficulty breathing, or feel very tired , stop exercising immediately and seek medical attention. 3)  You need to lose weight. Consider a lower calorie diet and regular exercise. Congrats on weight . 4)  Tobacco is very bad for your health and your loved ones! You Should stop smoking!.Current 10/day 5)  Increase the potassium to twice daily and try 4 ounces tonic water daily to see if this reduces the cramps. Prescriptions: KLOR-CON M20 20 MEQ CR-TABS (POTASSIUM CHLORIDE CRYS CR) Take 1 tablet by mouth two times a day , dose increase effective 07/19/2010  #60 x 3   Entered and Authorized by:   Syliva Overman MD   Signed by:   Syliva Overman MD on 07/20/2010  Method used:   Printed then faxed to ...       [facility] [address] [facility] (retail)       [address]       [place], [place]  [address]       Ph: [phone]       Fax: [phone]   RxID:   [id number]    Orders Added: 1)  Est. Patient Level IV [401[address]] 2)  Psychology Referral [Psychology] 3)  Radiology Referral [Radiology]

## 2010-08-06 ENCOUNTER — Telehealth: Payer: Self-pay | Admitting: Family Medicine

## 2010-08-07 NOTE — Progress Notes (Signed)
Summary: medicine  Phone Note Call from Patient   Summary of Call: pt has cough and sinus and would like to get benzonatate 100mg  [phone] [facility] in [place] pt states bronchitis too. Initial call taken by: Rudene Anda,  August 01, 2010 9:22 AM  Follow-up for Phone Call        please notify the patient and the pharmacy of the medication change. The new script is entered historically, please send after speaking with the patient.  Follow-up by: Syliva Overman MD,  August 01, 2010 2:36 PM    New/Updated Medications: TESSALON PERLES 100 MG CAPS (BENZONATATE) Take 1 capsule by mouth three times a day as needed for cough and chest congestion Prescriptions: TESSALON PERLES 100 MG CAPS (BENZONATATE) Take 1 capsule by mouth three times a day as needed for cough and chest congestion  #42 x 0   Entered by:   Adella Hare LPN   Authorized by:   Syliva Overman MD   Signed by:   Adella Hare LPN on 96/29/5284   Method used:   Electronically to        [facility]  [address] (retail)       [address]       [place], [place]  [address]       Ph: [phone]       Fax: [phone]   RxID:   [id number] TESSALON PERLES 100 MG CAPS (BENZONATATE) Take 1 capsule by mouth three times a day as needed for cough and chest congestion  #42 x 0   Entered and Authorized by:   Syliva Overman MD   Signed by:   Syliva Overman MD on 08/01/2010   Method used:   Historical   RxID:   [id number]

## 2010-08-14 NOTE — Progress Notes (Signed)
Summary: medicine  Phone Note Call from Patient   Summary of Call: left message and she has been out of work since Thursday does she need a another antibiotic. call back at  [phone] Initial call taken by: Lind Guest,  August 06, 2010 2:10 PM  Follow-up for Phone Call        patient was not on an antibiotic was prescribed tessalon perles Follow-up by: Adella Hare LPN,  August 06, 2010 4:17 PM  Additional Follow-up for Phone Call Additional follow up Details #1::         states she has been having sinus congestion , fever and chills last week which have  gone , sputum persiting and varies from brown to yellow, took opersonal days last week, returning tomorrow and requests a zpack, same sent in and she is aware Additional Follow-up by: Syliva Overman MD,  August 06, 2010 4:56 PM    New/Updated Medications: AZITHROMYCIN 250 MG TABS (AZITHROMYCIN) two tablets on day one , then one tablet once daily for an additional four days Prescriptions: AZITHROMYCIN 250 MG TABS (AZITHROMYCIN) two tablets on day one , then one tablet once daily for an additional four days  #6 x 0   Entered and Authorized by:   Syliva Overman MD   Signed by:   Syliva Overman MD on 08/06/2010   Method used:   Electronically to        [facility]  [address] (retail)       [address]       [place], [place]  [address]       Ph: [phone]       Fax: [phone]   RxID:   [id number]

## 2010-09-25 ENCOUNTER — Other Ambulatory Visit: Payer: Self-pay | Admitting: Family Medicine

## 2010-10-09 NOTE — Op Note (Signed)
NAME:  Anna Cantu, Anna Cantu        ACCOUNT NO.:  [id number]   MEDICAL RECORD NO.:  [id number]          PATIENT TYPE:  AMB   LOCATION:  DAY                           FACILITY:  [facility]   PHYSICIAN:  R. Roetta Sessions, M.D. DATE OF BIRTH:  1965-07-12   DATE OF PROCEDURE:  08/19/2008  DATE OF DISCHARGE:                               OPERATIVE REPORT   PROCEDURE:  Esophagogastroduodenoscopy diagnostic and follow-up  colonoscopy with biopsy.   INDICATIONS FOR PROCEDURE:  A 45 year old lady with epigastric pain,  vague intermittent dysphagia to solid foods with intermittent rectal  bleeding in a setting of constipation.  EGD and colonoscopy are now  being done.  Please note [name] tells me that the dysphagia  symptoms are few and far between with things like meat.  She does well  with pills, bread and most everything most of the time.  EGD and  colonoscopy are now being done.  The risks, benefits, alternatives and  limitations have been discussed.  Questions answered and all parties  agreeable.  Please see the documentation in the medical record.   PROCEDURE NOTE:  O2 saturation, blood pressure, pulse and respiration  were monitored throughout the entire procedure.   CONSCIOUS SEDATION:  Versed 6 mg IV, Demerol 125 mg IV in divided doses.   INSTRUMENT:  Pentax video chip system.   Cetacaine spray for topical pharyngeal anesthesia.   FINDINGS:  EGD examination of the tubular esophagus revealed two distal  inverted V erosions at the EG junction.  The tubular esophagus was  otherwise normal appearing and patent through the EG junction.  Stomach:  Gastric cavity was emptied and insufflated well with air.  Thorough examination of the gastric mucosa, including retroflexion in  the proximal stomach and esophagogastric junction demonstrated only a  small hiatal hernia.  Pylorus was patent and easily traversed.  Examination of the bulb and second portion revealed no abnormalities.   THERAPEUTIC/DIAGNOSTIC MANEUVERS PERFORMED:  None.   The patient tolerated the procedure well and was prepared for  colonoscopy.  Digital rectal exam revealed no abnormalities.   ENDOSCOPIC FINDINGS:  The prep was adequate.   Colon:  Colonic mucosa was surveyed from the rectosigmoid junction  through the left transverse and right colon to the appendiceal orifice,  ileocecal valve and cecum.  These structures were well seen and  photographed for the record.  From this level, the scope was slowly and  cautiously withdrawn.  All previously mentioned mucosal surfaces were  again seen.  The colonic mucosa appeared normal.  The scope was pulled  down in the rectum where a thorough examination of the rectal mucosa,  including retroflexed view of the anal verge __________anal canal  demonstrated some minimal anal canal hemorrhoids and two diminutive  polyps seen at 5 cm which were cold biopsied/removed.  The patient  tolerated both procedures well and was reactive in endoscopy.   IMPRESSION:  1. EGD:  Distal esophageal erosions, consistent with erosive reflux      esophagitis.  2. Small hiatal hernia.  3. The remainder of her upper GI tract appeared normal.   COLONOSCOPY FINDINGS:  Anal canal hemorrhoids.  Diminutive rectal polyp,  status post cold biopsy and removal.  The remainder of rectal mucosa and  colon appeared normal.  Cecal withdrawal time 10 minutes.   RECOMMENDATIONS:  1. Stop Omeprazole, begin Kapidex 60 mg orally before breakfast.  2. Daily __________1 tablespoon daily.  3. Anusol-HC suppositories one per rectum at bedtime x10 days.  4. MiraLax 17 grams orally nightly p.r.n. constipation.  5. Follow-up on path.  6. Further recommendations to follow.      Jonathon Bellows, M.D.  Electronically Signed     RMR/MEDQ  D:  08/19/2008  T:  08/19/2008  Job:  [id number]   cc:   Milus Mallick. Lodema Hong, M.D.  Fax: [phone]

## 2010-10-12 NOTE — H&P (Signed)
NAME:  Anna Cantu, Anna Cantu        ACCOUNT NO.:  [id number]   MEDICAL RECORD NO.:  [id number]           PATIENT TYPE:  AMB   LOCATION:  DAY                           FACILITY:  [facility]   PHYSICIAN:  Dalia Heading, M.D.  DATE OF BIRTH:  10-30-1965   DATE OF ADMISSION:  DATE OF DISCHARGE:  LH                                HISTORY & PHYSICAL   CHIEF COMPLAINT:  Chronic cholecystitis.   HISTORY OF PRESENT ILLNESS:  The patient is a 45 year old black female who  is referred for evaluation and treatment of biliary colic secondary to  chronic cholecystitis.  She has been having right upper quadrant abdominal  pain, nausea, bloating for many weeks.  She does have fatty food  intolerance.  No fever, chills, jaundice have been noted.   PAST MEDICAL HISTORY:  Unremarkable.   PAST SURGICAL HISTORY:  1. Right inguinal herniorrhaphy.  2. Carpal tunnel release.  3. Knee surgery.   CURRENT MEDICATIONS:  Motrin, Vicodin.   ALLERGIES:  No known drug allergies.   REVIEW OF SYSTEMS:  Noncontributory.   PHYSICAL EXAMINATION:  GENERAL:  The patient is a well-developed, well-  nourished black female in no acute distress.  HEENT: Examination reveals no scleral icterus.  LUNGS:  Clear to auscultation with equal breath sounds bilaterally.  HEART: Examination reveals regular rate and rhythm without history, S4, or  murmurs.  ABDOMEN:  Soft and nondistended.  She is tender in the right upper quadrant  to palpation.  No hepatosplenomegaly, masses, hernias are identified.   Ultrasound gallbladder is unremarkable.   HIDA scan reveals chronic cholecystitis with a low gallbladder ejection  fraction.   IMPRESSION:  Chronic cholecystitis.   PLAN:  The patient is scheduled for laparoscopic cholecystectomy on March 31, 2006.  The risks and benefits of the procedure including bleeding,  infection, hepatobiliary injury,  and the possibly an open procedure were  fully explained to the patient, gave  informed consent.      Dalia Heading, M.D.  Electronically Signed     MAJ/MEDQ  D:  03/25/2006  T:  03/25/2006  Job:  [id number]   cc:   [facility]  Fax: [phone]   Milus Mallick. Lodema Hong, M.D.  Fax: [phone]

## 2010-10-12 NOTE — H&P (Signed)
NAME:  Anna Cantu, Anna Cantu        ACCOUNT NO.:  [id number]   MEDICAL RECORD NO.:  [id number]          PATIENT TYPE:  AMB   LOCATION:  DAY                           FACILITY:  [facility]   PHYSICIAN:  Tilda Burrow, M.D. DATE OF BIRTH:  11-26-1965   DATE OF ADMISSION:  DATE OF DISCHARGE:  LH                              HISTORY & PHYSICAL   ADMITTING DIAGNOSES:  1. Menorrhagia.  2. Desire for elective permanent sterilization.  3. Chronic back pain.  4. Small subserosal fibroid.  5. Mild L3-4 disk herniation without subdural compromise.   HISTORY OF PRESENT ILLNESS:  This 45 year old female is admitted at this  time for a hysteroscopy, D&C and endometrial ablation for referral from  Dr. Lodema Hong for heavy periods.  She was seen in our office beginning in  March 2001 for heavy periods lasting up to two weeks with associated  anemia with documented hemoglobin of 11.6.  She has had ultrasound which  shows a small subserosal fibroid and, otherwise, unremarkable uterine  contours with GC and chlamydia culture negative and a Pap smear per Dr.  Anthony Sar report is negative.  We have documented hemoglobin drop from  13.3 and 40.0 in August to 11 in January, attributable to menstrual  blood loss.  The patient also desires permanent sterilization.  She has  been seen in our office and Gynecare ThermaChoice endometrial ablation  was reviewed with her and plans to proceed with endometrial ablation as  soon as possible.   PAST MEDICAL HISTORY:  1. Positive for chronic back pain.  2. Upper respiratory infection with resolution in February 2008.  3. Carpal tunnel syndrome.  4. Back and neck pain, which she perceived to be worse at the time of      her menses.  She does not know if she gains fluid retention with      menses.   PAST SURGICAL HISTORY:  1. Carpal tunnel release.  2. Knee surgery.  3. Hernia repair.  4. Gallbladder removed/cholecystectomy.   MEDICATIONS:  1. Motrin 800.  2.  Flexeril.  3. Vicodin p.r.n.  4. Provera 10 mg p.o. q.d. (started on 09/12/2006).   PHYSICAL EXAMINATION:  GENERAL APPEARANCE:  A large-framed African-  American female who is alert and weighs 202.  VITAL SIGNS:  Blood pressure is 136/72.  Pulse 70.  Menses began on  09/11/2006.  HEENT:  Within normal limits.  NECK:  Supple.  Normal thyroid.  Trachea is midline.  BREAST EXAM:  Deferred.  CHEST:  Clear to auscultation.  HEART EXAM:  Regular rate and rhythm.  ABDOMEN:  Obese and without masses.  EXTERNAL GENITALIA:  Normal.  VAGINAL EXAM:  Normal secretions.  Cervix is multiparous.  Uterus is  mobile and nontender.   IMPRESSION:  1. Menorrhagia.  2. Elective sterilization.   PLAN:  1. Hysteroscopy, D&C and endometrial ablation on 09/18/2006.  2. Laparoscopic tubal sterilization on 09/18/2006.  3. Followup in two weeks at [facility].      Tilda Burrow, M.D.  Electronically Signed     JVF/MEDQ  D:  09/15/2006  T:  09/16/2006  Job:  [id number]  cc:   [facility]   Margaret E. Lodema Hong, M.D.  Fax: [phone]

## 2010-10-12 NOTE — Op Note (Signed)
Anna Cantu, Anna Cantu        ACCOUNT NO.:  [id number]   MEDICAL RECORD NO.:  [id number]          PATIENT TYPE:  AMB   LOCATION:  DAY                           FACILITY:  [facility]   PHYSICIAN:  Tilda Burrow, M.D. DATE OF BIRTH:  08/05/1965   DATE OF PROCEDURE:  09/18/2006  DATE OF DISCHARGE:                               OPERATIVE REPORT   PREOPERATIVE DIAGNOSES:  1. Menorrhagia.  2. Elective permanent sterilization.  3. Small subserosal fibroid.   POSTOPERATIVE DIAGNOSES:  1. Menorrhagia.  2. Elective permanent sterilization.  3. Submucosal fibroid.  4. Endometriosis left uterosacral ligament.   PROCEDURE:  1. Hysteroscopy.  2. Dilatation and curettage.  3. Endometrial ablation.  4. Laparoscopic tubal sterilization with Falope rings.  5. Biopsy of left uterosacral ligament peritoneum.   SURGEON:  Tilda Burrow, M.D.   ASSISTANT:  __________.   ANESTHESIA:  General.   COMPLICATIONS:  None.   FINDINGS:  Uterus sounds to 9 cm.  Broad-based submucosal fibroid of  anterior uterine wall.   DETAILS OF PROCEDURE:  The patient was taken to the operating room,  prepped and draped for combined abdominal and vaginal procedure.  Tubal  sterilization and biopsy were performed first.  Laparoscopic tubal  sterilization was performed by grasping the cervix with Hulka tenaculum  with in-and-out catheterization of the bladder.  Then an infraumbilical  1 cm skin incision, as well as a transverse suprapubic 1 cm incision  performed.  The Verress needle was introduced through the umbilicus,  will orienting the needle tip to the pelvis.  A water droplet test  confirmed peritoneal location, and pneumoperitoneum achieved under 14  mmHg pressure.   The laparoscopic 5 mm trocar was then introduced through the umbilicus  under direct visualization with camera optics, and the pelvis inspected.  There was no evidence of trauma or bleeding inside the abdominal cavity  that could be  identified.  Suprapubic trocar was then introduced under  direct visualization and attention directed to the left fallopian tube  which was grasped, elevated, and the Falope ring applied to the mid  portion with photo documentation taken and Marcaine solution injected  above-and-below the Falope ring.   On the opposite a similar technique was performed and then after  completion the photo documentation was performed.  Inspection of the  pelvis revealed some thin, filmy adhesions attaching the left ovary to  the back of the uterus, as well as to the side wall.  These were thin  and filmy and were able to be disrupted.  Inspection of the cul-de-sac  showed a fibrotic band involving pelvic fat and the uterosacral  ligaments on the left.  There was some fibrosis and operative scissors  were used to free up the fibrotic band and release the pelvic tissues  somewhat.  The specimen was grasped then taken out, and sent for his  knowledge evaluation.   Deflation of the abdomen instilling 120 mL of saline solution was then  followed by subcuticular 4-0 Dexon closure of umbilical cords.   ENDOMETRIAL ABLATION:  Endometrial ablation was performed in the  standard fashion, grasped with a speculum in place  with a single-tooth  tenaculum grasping the anterior lip of the cervix, sounding the uterus  to 9 cm, dilation to 29-French, an introduction of 30-degree rigid  hysteroscope for evaluation.  Where we were able to identify endometrial  cavity, there was an anterior wall fundal fibroid that did not seem to  interfere with a hysteroscopy.  Smooth, sharp curettage was performed in  all quadrants.  Reinspection with the hysteroscope showed they fibroid  even more prevalent with the new spasms that had occurred in response to  the curettage.  Photos were taken in both circumstances.   We then proceed with the Gynecare ThermaChoice 3 endometrial ablation  sequence of eight minutes, with 15 mL of D5W  used for the cautery; and  the same amounts retrieved.  The Marcaine solution was then used to  inject around the uterus that 4 and 8 o'clock to improve postsurgical  discomfort; and the patient went to recovery room in good condition.  Sponge and needle counts correct.      Tilda Burrow, M.D.  Electronically Signed     JVF/MEDQ  D:  09/18/2006  T:  09/18/2006  Job:  [id number]   cc:   Milus Mallick. Lodema Hong, M.D.  Fax: [phone]

## 2010-10-12 NOTE — Op Note (Signed)
Anna Cantu, TWARDOWSKI        ACCOUNT NO.:  [id number]   MEDICAL RECORD NO.:  [id number]          PATIENT TYPE:  AMB   LOCATION:  DAY                           FACILITY:  [facility]   PHYSICIAN:  Dalia Heading, M.D.  DATE OF BIRTH:  1966-01-10   DATE OF PROCEDURE:  03/28/2006  DATE OF DISCHARGE:                                 OPERATIVE REPORT   PREOPERATIVE DIAGNOSIS:  Chronic cholecystitis.   POSTOPERATIVE DIAGNOSIS:  Chronic cholecystitis.   ANESTHESIA:  Laparoscopic cholecystectomy.   SURGEON:  Dalia Heading, M.D.   ANESTHESIA:  General endotracheal.   INDICATIONS:  The patient is a 45 year old black female who presents with  biliary colic secondary to chronic cholecystitis.  The risks and benefits of  the procedure including bleeding, infection, hepatobiliary, possibility of  an open procedure were fully explained to the patient who gave informed  consent.   PROCEDURE NOTE:  The patient was placed in the supine position.  After  induction of general endotracheal anesthesia, the abdomen was prepped and  draped using the usual sterile technique with Betadine.  Surgical site  confirmation was performed.   An infraumbilical incision was made down to the fascia.  The Veress needle  was introduced into the abdominal cavity and confirmation of placement was  done using the saline drop test.  The abdomen was then insufflated to 16  mmHg pressure.  11 mm trocar was introduced into the abdominal cavity under  direct visualization without difficulty.  The patient was placed in reversed  Trendelenburg position.  An additional 11-mm trocar was placed in the  epigastric region and 5-mm trocars placed in the right upper quadrant and  right flank regions.  The liver was inspected and noted to be within normal  limits.  The gallbladder was retracted superior and laterally.  The  dissection was begun around the infundibulum of the gallbladder.  The cystic  duct was first identified.   Its juncture to the infundibulum was fully  identified.  Endoclips were placed proximally and distally on the cystic  duct and the cystic duct was divided.  This likewise done to the cystic  artery.  The gallbladder then freed away from the gallbladder fossa using  Bovie electrocautery.  The gallbladder was delivered through the epigastric  trocar site using an EndoCatch bag.  The gallbladder fossa was inspected and  no abnormal bleeding or bile leakage was noted.  Surgicel was placed in the  gallbladder fossa.  All fluid and air were then evacuate from the abdominal  cavity prior to removing the trocars.   All wounds were irrigated normal saline.  All wounds were checked with 0.5%  Sensorcaine.  The infraumbilical fascia was reapproximated using an 0 Vicryl  interrupted suture.  All skin incisions were closed using staples.  Betadine  ointment and dry sterile dressings were applied.  All tape and needle counts  were correct at the end of the procedure.  The patient was extubated in the  operating room and went back to the recovery room awake in stable condition.   COMPLICATIONS:  None.   SPECIMEN:  Gallbladder.   ESTIMATED  BLOOD LOSS:  Minimal.      Dalia Heading, M.D.  Electronically Signed     MAJ/MEDQ  D:  03/28/2006  T:  03/29/2006  Job:  [id number]   cc:   Milus Mallick. Lodema Hong, M.D.  Fax: [phone]

## 2010-11-01 ENCOUNTER — Encounter: Payer: Self-pay | Admitting: Family Medicine

## 2010-11-05 ENCOUNTER — Encounter: Payer: Self-pay | Admitting: *Deleted

## 2010-11-05 ENCOUNTER — Ambulatory Visit (INDEPENDENT_AMBULATORY_CARE_PROVIDER_SITE_OTHER): Payer: Private Health Insurance - Indemnity | Admitting: Family Medicine

## 2010-11-05 ENCOUNTER — Encounter: Payer: Self-pay | Admitting: Family Medicine

## 2010-11-05 VITALS — BP 104/64 | HR 73 | Resp 16 | Ht 65.0 in | Wt 188.0 lb

## 2010-11-05 DIAGNOSIS — F32A Depression, unspecified: Secondary | ICD-10-CM | POA: Insufficient documentation

## 2010-11-05 DIAGNOSIS — M549 Dorsalgia, unspecified: Secondary | ICD-10-CM

## 2010-11-05 DIAGNOSIS — J309 Allergic rhinitis, unspecified: Secondary | ICD-10-CM

## 2010-11-05 DIAGNOSIS — J302 Other seasonal allergic rhinitis: Secondary | ICD-10-CM

## 2010-11-05 DIAGNOSIS — F438 Other reactions to severe stress: Secondary | ICD-10-CM

## 2010-11-05 DIAGNOSIS — F17200 Nicotine dependence, unspecified, uncomplicated: Secondary | ICD-10-CM

## 2010-11-05 DIAGNOSIS — R7301 Impaired fasting glucose: Secondary | ICD-10-CM

## 2010-11-05 DIAGNOSIS — F329 Major depressive disorder, single episode, unspecified: Secondary | ICD-10-CM

## 2010-11-05 DIAGNOSIS — F4389 Other reactions to severe stress: Secondary | ICD-10-CM

## 2010-11-05 DIAGNOSIS — B009 Herpesviral infection, unspecified: Secondary | ICD-10-CM

## 2010-11-05 DIAGNOSIS — L509 Urticaria, unspecified: Secondary | ICD-10-CM | POA: Insufficient documentation

## 2010-11-05 LAB — HEMOGLOBIN A1C: Hgb A1c MFr Bld: 5.4 % (ref <5.7)

## 2010-11-05 MED ORDER — CETIRIZINE HCL 10 MG PO CHEW: 10.0000 mg | CHEWABLE_TABLET | Freq: Every day | ORAL | Status: DC

## 2010-11-05 MED ORDER — VALACYCLOVIR HCL 500 MG PO TABS: ORAL_TABLET | ORAL | Status: DC

## 2010-11-05 MED ORDER — FLUOXETINE HCL 10 MG PO CAPS: 10.0000 mg | ORAL_CAPSULE | Freq: Every day | ORAL | Status: DC

## 2010-11-05 MED ORDER — METHYLPREDNISOLONE ACETATE 80 MG/ML IJ SUSP
80.0000 mg | Freq: Once | INTRAMUSCULAR | Status: AC
  Administered 2010-11-05: 80 mg via INTRAMUSCULAR

## 2010-11-05 NOTE — Assessment & Plan Note (Signed)
Reports perimenstrual vag itch with no d/c , may be related to viral flare, will attempt dose inc at that time, also HBa1C to e checked to ensure sh is not diabetic

## 2010-11-05 NOTE — Assessment & Plan Note (Signed)
Deteriorated, son who was injured in war has ne nset seizures and is ut of state, her ability to work is compromised

## 2010-11-05 NOTE — Patient Instructions (Addendum)
F/U in 3 month  It is important that you exercise regularly at least 30 minutes 5 times a week. If you develop chest pain, have severe difficulty breathing, or feel very tired, stop exercising immediately and seek medical attention  A healthy diet is rich in fruit, vegetables and whole grains. Poultry fish, nuts and beans are a healthy choice for protein rather then red meat. A low sodium diet and drinking 64 ounces of water daily is generally recommended. Oils and sweet should be limited. Carbohydrates especially for those who are diabetic or overweight, should be limited to 34-45 gram per meal. It is important to eat on a regular schedule, at least 3 times daily. Snacks should be primarily fruits, vegetables or nuts.   hBA1C today.  You will be referred to psychiatry , you need to see a psychiatrist to be evaluated to be taken out of work for the prolonged period that you need, I will set up an appt asap, and be in touch. You need to call your job to get counseling through therapy , to help with coping with the stress  I will start you on med today for depression.  For 2 days before the start of your next cycle, pls double the valtrex, this may reduce the itching you experience.  Certrizine will be sent in for itching  Congrats on smoking cessation  Injection today for back pain and allergies.  Work excuse to start in am return next Monday.  It is VITAL you see therapist

## 2010-11-05 NOTE — Assessment & Plan Note (Signed)
Deteriorated , will satrt certrizine

## 2010-11-05 NOTE — Assessment & Plan Note (Signed)
Currently quit, pt applauded and encouraged to stay this way

## 2010-11-05 NOTE — Assessment & Plan Note (Signed)
Deteriorated, need to refer to psych, and start antidepressant

## 2010-11-05 NOTE — Progress Notes (Signed)
  Subjective:    Patient ID: Anna Cantu, female    DOB: 1966/01/20, 45 y.o.   MRN: [id number]  HPI Pt tearful  And overwhelmed, anxious and scared, her only child was injured while in active duty approx 11 months ago, still has residual debility, including memory loss and mew seizures. She reports crying excessively, poor concentration and memory, with inability to do her job well, requests at least 30 day work excuse for mental health reasons , with a plan also to visit with her son , who is out of state until Jan 2013, to assist in his care She is neither suicidal nor homicidal, and denies hallucinations  She understands clearly that she absolutely needs to be evaluated and treated by psychiatry for such a long sick leave on mental health reasons, which I do believe that she needs. Primarily to protect her job and income.She will attempt to get therapy  through her job . C/o itch in left forearm and right foot, no rash for the past 2 to  Weeks. 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 Denies sinus pressure,  ear pain or sore throat.Reports increased nasal congestion with clear drainage and allergy symptoms in past approx 2 months Denies chest congestion, productive cough or wheezing. Denies chest pains, palpitations, paroxysmal nocturnal dyspnea, orthopnea and leg swelling Denies abdominal pain, nausea, vomiting,diarrhea or constipation.  Denies rectal bleeding or change in bowel movement. Denies dysuria, frequency, hesitancy or incontinence. Denies headaches, seizure, numbness, or tingling. Denies skin break down or rash.        Objective:   Physical Exam Patient alert and oriented and in no Cardiopulmonary distress.  HEENT: No facial asymmetry, EOMI, no sinus tenderness, TM's clear,  Oropharynx pink and moist.  Neck supple no adenopathy. Nasal mucosa erythematous and edematous Chest: Clear to auscultation bilaterally.  CVS: S1, S2 no murmurs, no S3.  ABD: Soft non tender. Bowel sounds normal.  Ext: No edema  MS: decreased ROM spine adequate in, shoulders, hips and knees.  Skin: Intact, no ulcerations or rash noted.  Psych: Good eye contact,Tearful, anxious and  depressed appearing.Appears grief stricken when she discusses he son Not suicidal or homicidal, not hallucinating  CNS: CN 2-12 intact, power, tone and sensation normal throughout.        Assessment & Plan:

## 2010-11-05 NOTE — Assessment & Plan Note (Signed)
Deteriorated, depomedrol administered

## 2010-11-20 ENCOUNTER — Ambulatory Visit: Payer: Private Health Insurance - Indemnity | Admitting: Family Medicine

## 2011-01-03 ENCOUNTER — Ambulatory Visit (INDEPENDENT_AMBULATORY_CARE_PROVIDER_SITE_OTHER): Payer: Private Health Insurance - Indemnity | Admitting: Family Medicine

## 2011-01-03 ENCOUNTER — Telehealth: Payer: Self-pay | Admitting: Family Medicine

## 2011-01-03 DIAGNOSIS — R609 Edema, unspecified: Secondary | ICD-10-CM

## 2011-01-03 DIAGNOSIS — M25569 Pain in unspecified knee: Secondary | ICD-10-CM

## 2011-01-03 DIAGNOSIS — M549 Dorsalgia, unspecified: Secondary | ICD-10-CM

## 2011-01-03 MED ORDER — KETOROLAC TROMETHAMINE 30 MG/ML IJ SOLN
60.0000 mg | Freq: Once | INTRAMUSCULAR | Status: AC
  Administered 2011-01-03: 60 mg via INTRAMUSCULAR

## 2011-01-03 MED ORDER — FUROSEMIDE 10 MG/ML IJ SOLN
10.0000 mg | Freq: Once | INTRAMUSCULAR | Status: AC
  Administered 2011-01-03: 10 mg via INTRAMUSCULAR

## 2011-01-03 NOTE — Telephone Encounter (Signed)
toradol 60mg  Im only

## 2011-01-03 NOTE — Telephone Encounter (Signed)
Last depo was 6/11. Can she come in for shots?

## 2011-01-03 NOTE — Telephone Encounter (Signed)
Patient aware.

## 2011-01-05 NOTE — Progress Notes (Signed)
  Subjective:    Patient ID: Anna Cantu, female    DOB: 12-Aug-1965, 46 y.o.   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 Also c/o leg swelling, lasix 10mg  IM ordered. Both given with no complications   Review of Systems     Objective:   Physical Exam        Assessment & Plan:

## 2011-01-09 ENCOUNTER — Other Ambulatory Visit: Payer: Self-pay | Admitting: Family Medicine

## 2011-01-09 ENCOUNTER — Encounter: Payer: Self-pay | Admitting: Family Medicine

## 2011-01-09 ENCOUNTER — Ambulatory Visit: Payer: Private Health Insurance - Indemnity | Admitting: Family Medicine

## 2011-01-09 ENCOUNTER — Telehealth: Payer: Self-pay | Admitting: Family Medicine

## 2011-01-09 ENCOUNTER — Ambulatory Visit (INDEPENDENT_AMBULATORY_CARE_PROVIDER_SITE_OTHER): Payer: Private Health Insurance - Indemnity | Admitting: Family Medicine

## 2011-01-09 VITALS — BP 112/80 | HR 73 | Temp 99.3°F | Resp 14 | Ht 65.0 in | Wt 191.1 lb

## 2011-01-09 DIAGNOSIS — R102 Pelvic and perineal pain: Secondary | ICD-10-CM

## 2011-01-09 DIAGNOSIS — N949 Unspecified condition associated with female genital organs and menstrual cycle: Secondary | ICD-10-CM

## 2011-01-09 DIAGNOSIS — J019 Acute sinusitis, unspecified: Secondary | ICD-10-CM

## 2011-01-09 LAB — POCT URINALYSIS DIPSTICK
Ketones, UA: NEGATIVE
Protein, UA: NEGATIVE
Spec Grav, UA: 1.015

## 2011-01-09 LAB — POCT URINE PREGNANCY: Preg Test, Ur: NEGATIVE

## 2011-01-09 MED ORDER — AMOXICILLIN-POT CLAVULANATE 875-125 MG PO TABS: 1.0000 | ORAL_TABLET | Freq: Two times a day (BID) | ORAL | Status: AC

## 2011-01-09 MED ORDER — FLUCONAZOLE 150 MG PO TABS: 150.0000 mg | ORAL_TABLET | Freq: Once | ORAL | Status: DC

## 2011-01-09 NOTE — Assessment & Plan Note (Signed)
Ca based on symptoms/exam and the time frame of on and off sinus congestion along with the cough. Will start her on Augmentin and have advised her to quit smoking. She has been given Diflucan and she typically has yeast infections after taking antibiotics.

## 2011-01-09 NOTE — Telephone Encounter (Signed)
She states she was having contraction type pains lastnight. It was hurting so bad every few minutes and it was similar to having contractions. She did not go to the ER and today she isn't having them. Also bled dark blood for a whole week 3 weeks ago which is unusual for her. Wanted [place] to order an ultrasound. I asked if she had an OB and she said Anna Cantu. Advised her to check with their office regarding her symptoms.

## 2011-01-09 NOTE — Progress Notes (Signed)
  Subjective:    Patient ID: Anna Cantu, female    DOB: 12-03-1965, 45 y.o.   MRN: [id number]  HPI The patient presents with 2 complaints. Pelvic pain as well as sinus congestion and blurry vision Sinus congestion- she is still congested with subjective fever for the past 2 days. Her congestion does go back further or she has it on and off associated with a productive cough of brown sputum. She denies blood in the sputum. She was taking Tessalon almost daily for cough. She also admits to headache and blurred vision which she had yesterday. She felt like her eyes were glassy. She was able to drive today without any problems. She denies shortness of breath, chest pain.+ Clear/yellow drainage from nares, no sore throat  Pelvic pain- yesterday evening patient had contraction-like pain in the lower pelvis. She was very concerned as 3 weeks ago she had very heavy cycle which lasted one week which is very unusual for her. She has history of a tubal ligation. The cramping persisted through the night. She denies any vaginal discharge, dysuria, recent change in stools. She has been bloated and has been taking Zantac. She has history of fibroids and is unsure if these are growing. She admits to nausea but no vomiting She notes her cycles have been coming closer together , approximately every 3 weeks  Headache, sinus pressure,  Was taking tessolon for cough , blurry vision, drainage is clear, cough is productive of brown, no blood in sputum Cough has been on and off. She was treated for bronchitis    Review of Systems - per above     Objective:   Physical Exam GEN- NAD, alert and oriented x3 HEENT- PERRL, EOMI, non injected sclera, pink conjunctiva, MMM, oropharynx clear, fundoscopic exam benign, enlarged erythematous nasal turbinates, clear rhinorrhea noted, vision screen per above, + maxillary, frontal and ethmoid sinus tenderness Neck- Supple, no thryomegaly, Lymph- small mobile  submandibular node, shotty lymph node on left anterior cervical chain CVS- RRR, no murmur RESP-CTAB ABD- NABS, soft, TTP over pelvic region R >L, no masses, no CVA tenderness GU- normal external genitalia, cervix visualized- no lesions, clear discharge from os, no bleeding, no CMT, TTP over right ovarian region, no mass, uterus by palpation appears normal size, normal vaginal mucosa Pulses- Radial, DP- 2+ EXT- No edema       Assessment & Plan:

## 2011-01-09 NOTE — Patient Instructions (Signed)
For your pelvic pain, we will set you up for an ultrasound to look at the fibroids and any other source for pain Take Advil or Ibuprofen for your cramping. If you have any other abnormal bleeding let me know. Your vision screen was normal today If your vision changes again, you have increased pressure or fever > 100.43F please come back for a recheck. Take the antibiotic for your sinus infection/cough

## 2011-01-09 NOTE — Progress Notes (Signed)
Addended by: Abner Greenspan on: 01/09/2011 12:49 PM   Modules accepted: Orders

## 2011-01-09 NOTE — Progress Notes (Signed)
Addended by: Abner Greenspan on: 01/09/2011 12:51 PM   Modules accepted: Orders

## 2011-01-09 NOTE — Assessment & Plan Note (Addendum)
Based on today's exam a concern for fibroids with the abnormal bleeding I will send her for pelvic ultrasound. GC chlamydia, wet prep was obtained. Urinalysis was negative, Upreg  is negative. Patient will start anti-inflammatories as needed at this time Based on results will refer her to gynecology

## 2011-01-10 ENCOUNTER — Other Ambulatory Visit: Payer: Self-pay | Admitting: Family Medicine

## 2011-01-10 DIAGNOSIS — R102 Pelvic and perineal pain: Secondary | ICD-10-CM

## 2011-01-10 LAB — WET PREP BY MOLECULAR PROBE
Candida species: NEGATIVE
Gardnerella vaginalis: NEGATIVE
Trichomonas vaginosis: NEGATIVE

## 2011-01-13 LAB — GC/CHLAMYDIA PROBE AMP, GENITAL
Chlamydia, DNA Probe: NEGATIVE
GC Probe Amp, Genital: NEGATIVE

## 2011-01-15 ENCOUNTER — Other Ambulatory Visit: Payer: Self-pay | Admitting: Family Medicine

## 2011-01-15 ENCOUNTER — Ambulatory Visit (HOSPITAL_COMMUNITY): Admission: RE | Admit: 2011-01-15 | Payer: Managed Care, Other (non HMO) | Source: Ambulatory Visit

## 2011-01-15 ENCOUNTER — Ambulatory Visit (HOSPITAL_COMMUNITY): Payer: Managed Care, Other (non HMO)

## 2011-01-17 ENCOUNTER — Other Ambulatory Visit (HOSPITAL_COMMUNITY): Payer: Managed Care, Other (non HMO)

## 2011-01-17 ENCOUNTER — Ambulatory Visit (HOSPITAL_COMMUNITY)
Admission: RE | Admit: 2011-01-17 | Discharge: 2011-01-17 | Disposition: A | Discharge status: Home / Self Care | Payer: Managed Care, Other (non HMO) | Source: Ambulatory Visit | Attending: Family Medicine | Admitting: Family Medicine

## 2011-01-17 DIAGNOSIS — R1031 Right lower quadrant pain: Secondary | ICD-10-CM | POA: Insufficient documentation

## 2011-01-17 DIAGNOSIS — N946 Dysmenorrhea, unspecified: Secondary | ICD-10-CM | POA: Insufficient documentation

## 2011-01-17 DIAGNOSIS — N83209 Unspecified ovarian cyst, unspecified side: Secondary | ICD-10-CM | POA: Insufficient documentation

## 2011-01-17 DIAGNOSIS — R1032 Left lower quadrant pain: Secondary | ICD-10-CM | POA: Insufficient documentation

## 2011-01-17 DIAGNOSIS — D259 Leiomyoma of uterus, unspecified: Secondary | ICD-10-CM | POA: Insufficient documentation

## 2011-01-17 DIAGNOSIS — R102 Pelvic and perineal pain: Secondary | ICD-10-CM

## 2011-01-21 ENCOUNTER — Telehealth: Payer: Self-pay | Admitting: Family Medicine

## 2011-01-22 ENCOUNTER — Telehealth: Payer: Self-pay | Admitting: Family Medicine

## 2011-01-22 NOTE — Telephone Encounter (Signed)
You can refill the tessalon, I never saw her for a burn. If she has a severe burn this needs to be looked at, especially if it requires meds. If it is a minor burn she can use Aloe which is over the counter or ask the pharmacist

## 2011-01-22 NOTE — Telephone Encounter (Signed)
This needs to be sent to provider who recently saw pt

## 2011-01-22 NOTE — Telephone Encounter (Signed)
Patient aware of results.

## 2011-01-22 NOTE — Telephone Encounter (Signed)
PATIENT WANTS A CREAM FOR A BURN ALSO WANTS REFILL ON TESSALON PEARLES, IS THIS OK?

## 2011-01-23 ENCOUNTER — Ambulatory Visit: Payer: Private Health Insurance - Indemnity | Admitting: Family Medicine

## 2011-01-23 ENCOUNTER — Other Ambulatory Visit: Payer: Self-pay

## 2011-01-23 MED ORDER — BENZONATATE 100 MG PO CAPS: ORAL_CAPSULE | ORAL | Status: DC

## 2011-01-23 MED ORDER — SILVER SULFADIAZINE 1 % EX CREA: TOPICAL_CREAM | CUTANEOUS | Status: DC

## 2011-02-06 ENCOUNTER — Encounter: Payer: Self-pay | Admitting: Family Medicine

## 2011-02-07 ENCOUNTER — Ambulatory Visit: Payer: Private Health Insurance - Indemnity | Admitting: Family Medicine

## 2011-03-06 ENCOUNTER — Other Ambulatory Visit: Payer: Self-pay | Admitting: Family Medicine

## 2011-03-14 ENCOUNTER — Telehealth: Payer: Self-pay | Admitting: Family Medicine

## 2011-03-14 MED ORDER — IBUPROFEN 800 MG PO TABS: 800.0000 mg | ORAL_TABLET | Freq: Three times a day (TID) | ORAL | Status: DC

## 2011-03-14 MED ORDER — HYDROCODONE-ACETAMINOPHEN 7.5-750 MG PO TABS: ORAL_TABLET | ORAL | Status: DC

## 2011-03-14 MED ORDER — VALACYCLOVIR HCL 500 MG PO TABS: ORAL_TABLET | ORAL | Status: DC

## 2011-03-14 NOTE — Telephone Encounter (Signed)
Sent in

## 2011-09-16 ENCOUNTER — Encounter (HOSPITAL_COMMUNITY): Payer: Self-pay | Admitting: *Deleted

## 2011-09-16 ENCOUNTER — Other Ambulatory Visit: Payer: Self-pay

## 2011-09-16 ENCOUNTER — Emergency Department (HOSPITAL_COMMUNITY): Payer: Self-pay

## 2011-09-16 ENCOUNTER — Emergency Department (HOSPITAL_COMMUNITY)
Admission: EM | Admit: 2011-09-16 | Discharge: 2011-09-16 | Disposition: A | Discharge status: Home / Self Care | Payer: Self-pay | Attending: Emergency Medicine | Admitting: Emergency Medicine

## 2011-09-16 DIAGNOSIS — R059 Cough, unspecified: Secondary | ICD-10-CM | POA: Insufficient documentation

## 2011-09-16 DIAGNOSIS — M549 Dorsalgia, unspecified: Secondary | ICD-10-CM | POA: Insufficient documentation

## 2011-09-16 DIAGNOSIS — R05 Cough: Secondary | ICD-10-CM | POA: Insufficient documentation

## 2011-09-16 DIAGNOSIS — X58XXXA Exposure to other specified factors, initial encounter: Secondary | ICD-10-CM | POA: Insufficient documentation

## 2011-09-16 DIAGNOSIS — M25579 Pain in unspecified ankle and joints of unspecified foot: Secondary | ICD-10-CM | POA: Insufficient documentation

## 2011-09-16 DIAGNOSIS — G8929 Other chronic pain: Secondary | ICD-10-CM | POA: Insufficient documentation

## 2011-09-16 DIAGNOSIS — K0889 Other specified disorders of teeth and supporting structures: Secondary | ICD-10-CM

## 2011-09-16 DIAGNOSIS — R209 Unspecified disturbances of skin sensation: Secondary | ICD-10-CM | POA: Insufficient documentation

## 2011-09-16 DIAGNOSIS — F17200 Nicotine dependence, unspecified, uncomplicated: Secondary | ICD-10-CM | POA: Insufficient documentation

## 2011-09-16 DIAGNOSIS — R0602 Shortness of breath: Secondary | ICD-10-CM | POA: Insufficient documentation

## 2011-09-16 DIAGNOSIS — R002 Palpitations: Secondary | ICD-10-CM

## 2011-09-16 DIAGNOSIS — R0789 Other chest pain: Secondary | ICD-10-CM | POA: Insufficient documentation

## 2011-09-16 DIAGNOSIS — K089 Disorder of teeth and supporting structures, unspecified: Secondary | ICD-10-CM | POA: Insufficient documentation

## 2011-09-16 DIAGNOSIS — S93409A Sprain of unspecified ligament of unspecified ankle, initial encounter: Secondary | ICD-10-CM

## 2011-09-16 HISTORY — DX: Unspecified ovarian cyst, unspecified side: N83.209

## 2011-09-16 LAB — CBC
HCT: 38.1 % (ref 36.0–46.0)
Hemoglobin: 13 g/dL (ref 12.0–15.0)
MCV: 92.9 fL (ref 78.0–100.0)
WBC: 7.7 10*3/uL (ref 4.0–10.5)

## 2011-09-16 LAB — BASIC METABOLIC PANEL
BUN: 8 mg/dL (ref 6–23)
CO2: 26 mEq/L (ref 19–32)
Chloride: 102 mEq/L (ref 96–112)
GFR calc Af Amer: >90 mL/min (ref >90)
Glucose, Bld: 92 mg/dL (ref 70–99)
Potassium: 3.3 mEq/L — ABNORMAL LOW (ref 3.5–5.1)

## 2011-09-16 MED ORDER — HYDROMORPHONE HCL PF 2 MG/ML IJ SOLN
2.0000 mg | Freq: Once | INTRAMUSCULAR | Status: AC
  Administered 2011-09-16: 2 mg via INTRAMUSCULAR
  Filled 2011-09-16: qty 1

## 2011-09-16 MED ORDER — HYDROCODONE-ACETAMINOPHEN 5-325 MG PO TABS: 1.0000 | ORAL_TABLET | Freq: Four times a day (QID) | ORAL | Status: AC | PRN

## 2011-09-16 MED ORDER — CLINDAMYCIN HCL 150 MG PO CAPS: 150.0000 mg | ORAL_CAPSULE | Freq: Four times a day (QID) | ORAL | Status: AC

## 2011-09-16 MED ORDER — FLUCONAZOLE 100 MG PO TABS
100.0000 mg | ORAL_TABLET | Freq: Once | ORAL | Status: AC
  Administered 2011-09-16: 100 mg via ORAL
  Filled 2011-09-16: qty 1

## 2011-09-16 MED ORDER — NAPROXEN 500 MG PO TABS: 500.0000 mg | ORAL_TABLET | Freq: Two times a day (BID) | ORAL | Status: DC

## 2011-09-16 NOTE — Discharge Instructions (Signed)
Ankle Sprain An ankle sprain happens when the bands of tissue that hold the ankle bones together (ligaments) stretch too much and tear.  HOME CARE   Put ice on the ankle for 15 to 20 minutes, 3 to 4 times a day.   Put ice in a plastic bag.   Place a towel between your skin and the bag.   You may stop icing when the puffiness (swelling) goes down.   Raise (elevate) the injured ankle to lessen puffiness.   Use crutches if your doctor tells you to. Use them until you can walk without pain.   If a plaster splint was applied:   Rest the plaster splint on nothing harder than a pillow for 24 hours.   Do not put weight on it.   Do not get it wet.   Take it off to shower or bathe.   Follow up with your doctor.   Use an elastic wrap for support. Take the wrap off if the toes lose feeling (numb), tingle, or turn cold or blue.   If an air splint was applied:   Add or release air to make it comfortable.   Take it off at night and to shower and bathe.   Wiggle your toes while wearing the air splint.   Only take medicine as told by your doctor.   Do not drive until your doctor says it is okay.  GET HELP RIGHT AWAY IF:   You have more bruising, puffiness, or pain.   Your toes feel cold.   Your medicine does not help lessen your pain.   You are losing feeling in your toes or they turn blue.   You have severe pain.  MAKE SURE YOU:   Understand these instructions.   Will watch your condition.   Will get help right away if you are not doing well or get worse.  Document Released: 10/30/2007 Document Revised: 05/02/2011 Document Reviewed: 10/30/2007 [facility] Patient Information 2012 [place], [place].   Use ankle lace up wrap as needed. Elevate foot as much as possible take pain medicine as needed for this and for the toothache take antibiotic for the toothache. Chest pain workup negative in the emergency department. Followup with her primary care doctor in the next few days.  We'll need to followup with dentist to further evaluate the tooth.

## 2011-09-16 NOTE — ED Notes (Addendum)
Palpitations , pain lt leg and foot and ankle, Pain lt  Side of face, thinks she may have abscess of tooth.  Chest pain,for 2 days, no injury, cough

## 2011-09-16 NOTE — ED Provider Notes (Signed)
History   This chart was scribed for Shelda Jakes, MD by Clarita Crane. The patient was seen in room APA11/APA11. Patient's care was started at 1318.   CSN: [id number]  Arrival date & time 09/16/11  1318   First MD Initiated Contact with Patient 09/16/11 1726      Chief Complaint  Patient presents with  . Palpitations    (Consider location/radiation/quality/duration/timing/severity/associated sxs/prior treatment) HPI Anna Cantu is a 46 y.o. female who presents to the Emergency Department complaining of constant moderate non-radiating chest pain described as sharp and dull localized to bilateral sides of chest onset last night approximately 22 hours ago and persistent since with associated SOB, near syncope, palpitations and a productive cough with clear sputum. Patient notes chest pain is aggravated with deep breathing. Denies diaphoresis, nausea, vomiting, diarrhea,   Additionally, patient c/o constant moderate left ankle pain localized to lateral aspect of left ankle stemming from an injury sustained approximately 1 week ago and persistent since. Patient notes that she began experiencing numbness to left foot radiating to left knee last night which has been persistent since onset. States she is able to walk but that pain is aggravated with walking. Reports application of heat and ice provided no relief.  Patient also c/o moderate dental pain localized to left lower molar onset 2-3 days ago and persistent since with associated intermittent numbness to left side of face. Denies head injury, blurred vision, nausea, vomiting.  Patient has h/o chronic back pain, gastritis, ovarian cyst and is a current smoker.  Past Medical History  Diagnosis Date  . Chronic back pain   . Nicotine addiction   . Obesity   . Hemorrhoids   . Gastritis     H/O remote EGD greater than 10 years ago  . Ovarian cyst     Past Surgical History  Procedure Date  . Right inguinal hernia repair  1982  . Right carpal tunnel release 1991  . Right knee surgery 1993  . Btl and endometrial abaltion   . Cholecystectomy 2007    Dr. Franky Macho   . Left knee surgey- arthroscopy june 2011     Dr. Eulah Pont   . Tubal ligation     History reviewed. No pertinent family history.  History  Substance Use Topics  . Smoking status: Current Everyday Smoker -- 0.5 packs/day  . Smokeless tobacco: Not on file  . Alcohol Use: Yes    OB History    Grav Para Term Preterm Abortions TAB SAB Ect Mult Living                  Review of Systems  Constitutional: Negative for fever.  HENT: Positive for dental problem. Negative for rhinorrhea.   Eyes: Negative for pain.  Respiratory: Positive for cough, chest tightness and shortness of breath.   Cardiovascular: Positive for chest pain and palpitations.  Gastrointestinal: Negative for nausea, vomiting, abdominal pain and diarrhea.  Genitourinary: Negative for dysuria.  Musculoskeletal: Positive for joint swelling and arthralgias. Negative for back pain.  Skin: Negative for rash.  Neurological: Positive for numbness. Negative for weakness and headaches.       Near syncope    Allergies  Amoxicillin  Home Medications   Current Outpatient Rx  Name Route Sig Dispense Refill  . VITAMIN C PO Oral Take 1 tablet by mouth daily.    . CYCLOBENZAPRINE HCL 10 MG PO TABS Oral Take 10 mg by mouth daily as needed. For muscle spasms    .  DIPHENHYDRAMINE HCL 25 MG PO TABS Oral Take 50 mg by mouth daily as needed. For itching    . OMEGA-3 FATTY ACIDS 1000 MG PO CAPS Oral Take 1 g by mouth daily.     . FUROSEMIDE 20 MG PO TABS Oral Take 20 mg by mouth daily. Take one tablet as needed for swelling     . GLUCOSAMINE 1500 COMPLEX PO Oral Take 1 tablet by mouth daily.    Marland Kitchen HYDROCODONE-ACETAMINOPHEN 7.5-750 MG PO TABS Oral Take 1 tablet by mouth at bedtime as needed. TAKE ONE TABLET BY MOUTH AT BEDTIME AS NEEDED FOR PAIN    . HYDROXYZINE HCL 50 MG PO TABS Oral  Take 50 mg by mouth as needed. For itching     . IBUPROFEN 200 MG PO TABS Oral Take 800 mg by mouth as needed. For pain    . ADULT MULTIVITAMIN W/MINERALS CH Oral Take 1 tablet by mouth daily.    Marland Kitchen ZINC PO Oral Take 1 tablet by mouth daily.    Marland Kitchen OMEPRAZOLE 20 MG PO CPDR Oral Take 20 mg by mouth daily as needed. For acid reflux    . POTASSIUM CHLORIDE CRYS ER 20 MEQ PO TBCR Oral Take 20 mEq by mouth daily. Take one daily with lasix (furosemide)     . PSEUDOEPHEDRINE HCL 60 MG PO TABS Oral Take 60 mg by mouth daily as needed. For allergy symptoms    . SILVER SULFADIAZINE 1 % EX CREA  Apply to affected area daily 50 g 0  . CLINDAMYCIN HCL 150 MG PO CAPS Oral Take 1 capsule (150 mg total) by mouth every 6 (six) hours. 28 capsule 0  . DICLOFENAC SODIUM 1 % TD GEL Topical Apply topically 4 (four) times daily. Apply to knee 4 times a day     . HYDROCODONE-ACETAMINOPHEN 5-325 MG PO TABS Oral Take 1-2 tablets by mouth every 6 (six) hours as needed for pain. 10 tablet 0  . NAPROXEN 500 MG PO TABS Oral Take 1 tablet (500 mg total) by mouth 2 (two) times daily. 14 tablet 0  . VALACYCLOVIR HCL 500 MG PO TABS  TAKE ONE CAPLET BY MOUTH ONCE DAILY,THEN TAKE ONE TWICE DAILY FOR 3 DAYS BEFORE ONSET OF MENSES. **DOSE INCREASE EFFECTIVE 11/05/10** 34 tablet 3    BP 125/96  Pulse 72  Temp(Src) 98.5 F (36.9 C) (Oral)  Resp 18  Ht 5\' 4"  (1.626 m)  Wt 192 lb (87.091 kg)  BMI 32.96 kg/m2  SpO2 99%  LMP 08/27/2011  Physical Exam  Nursing note and vitals reviewed. Constitutional: She is oriented to person, place, and time. She appears well-developed and well-nourished. No distress.  HENT:  Head: Normocephalic and atraumatic.  Mouth/Throat: Oropharynx is clear and moist.       Tenderness to left jaw and left back molar. Last molar on left with gray filling present. No facial swelling noted. Mucous membranes moist.   Eyes: EOM are normal. Pupils are equal, round, and reactive to light.  Neck: Neck supple. No  tracheal deviation present.  Cardiovascular: Normal rate, regular rhythm, normal heart sounds and intact distal pulses.  Exam reveals no gallop and no friction rub.   No murmur heard. Pulmonary/Chest: Effort normal. No respiratory distress. She has no wheezes. She has no rales.  Abdominal: Soft. Bowel sounds are normal. She exhibits no distension. There is no tenderness. There is no rebound and no guarding.  Musculoskeletal: Normal range of motion. She exhibits no edema.  Swelling to lateral aspect of left ankle with tenderness to palpation. Mild tenderness to palpation of medial aspect of left ankle. No proximal fibula tenderness. DP pulses 1+ bilaterally. Cap refill intact within bilateral lower extremities.   Neurological: She is alert and oriented to person, place, and time. No cranial nerve deficit or sensory deficit.  Skin: Skin is warm and dry.  Psychiatric: She has a normal mood and affect. Her behavior is normal.    ED Course  Procedures (including critical care time)  DIAGNOSTIC STUDIES: Oxygen Saturation is 100% on room air, normal by my interpretation.    COORDINATION OF CARE: 6:12PM-Patient informed of current plan for treatment and evaluation and agrees with plan at this time.    Date: 09/16/2011  Rate: 74  Rhythm: normal sinus rhythm  QRS Axis: normal  Intervals: normal  ST/T Wave abnormalities: normal  Conduction Disutrbances:none  Narrative Interpretation:   Old EKG Reviewed: none available    Labs Reviewed  BASIC METABOLIC PANEL - Abnormal; Notable for the following:    Potassium 3.3 (*)    All other components within normal limits  CBC  TROPONIN I  D-DIMER, QUANTITATIVE   Dg Chest 2 View  09/16/2011  *RADIOLOGY REPORT*  Clinical Data: Palpitations.  CHEST - 2 VIEW  Comparison: Chest x-ray 03/15/2010.  Findings: Lung volumes are normal.  No consolidative airspace disease.  No pleural effusions.  No pneumothorax.  No pulmonary nodule or mass noted.   Pulmonary vasculature and the cardiomediastinal silhouette are within normal limits.   Surgical clips projecting over the right upper quadrant of the abdomen, likely from prior cholecystectomy.  IMPRESSION: 1. No radiographic evidence of acute cardiopulmonary disease.  Original Report Authenticated By: Florencia Reasons, M.D.   Dg Ankle Complete Left  09/16/2011  *RADIOLOGY REPORT*  Clinical Data: Palpitations.  Ankle pain.  LEFT ANKLE COMPLETE - 3+ VIEW  Comparison: No priors.  Findings: Three views left ankle demonstrate no acute fracture, subluxation, dislocation, joint or soft tissue abnormality.  IMPRESSION:  1.  No acute radiographic abnormality of the left ankle.  Original Report Authenticated By: Florencia Reasons, M.D.   Results for orders placed during the hospital encounter of 09/16/11  CBC      Component Value Range   WBC 7.7  4.0 - 10.5 (K/uL)   RBC 4.10  3.87 - 5.11 (MIL/uL)   Hemoglobin 13.0  12.0 - 15.0 (g/dL)   HCT 08.6  57.8 - 46.9 (%)   MCV 92.9  78.0 - 100.0 (fL)   MCH 31.7  26.0 - 34.0 (pg)   MCHC 34.1  30.0 - 36.0 (g/dL)   RDW 62.9  52.8 - 41.3 (%)   Platelets 311  150 - 400 (K/uL)  BASIC METABOLIC PANEL      Component Value Range   Sodium 138  135 - 145 (mEq/L)   Potassium 3.3 (*) 3.5 - 5.1 (mEq/L)   Chloride 102  96 - 112 (mEq/L)   CO2 26  19 - 32 (mEq/L)   Glucose, Bld 92  70 - 99 (mg/dL)   BUN 8  6 - 23 (mg/dL)   Creatinine, Ser 2.44  0.50 - 1.10 (mg/dL)   Calcium 9.4  8.4 - 01.0 (mg/dL)   GFR calc non Af Amer >90  >90 (mL/min)   GFR calc Af Amer >90  >90 (mL/min)  TROPONIN I      Component Value Range   Troponin I <0.30  <0.30 (ng/mL)  D-DIMER, QUANTITATIVE  Component Value Range   D-Dimer, Quant 0.36  0.00 - 0.48 (ug/mL-FEU)     1. Chest pain   2. Heart palpitations   3. Ankle sprain   4. Toothache       MDM   Patient with 3 separate complaints. Left lower molar toothache. Left ankle sprain 82-week-old. And chest pain with heart  palpitations. Workup for the chest pain is negative with a negative d-dimer negative troponin not consistent with acute cardiac event EKG without acute changes d-dimer being negative not consistent with pulmonary embolism. Chest x-ray negative without any acute findings for pneumonia or pneumothorax. Will treat DL toothache with clindamycin followup with dentist treat the ankle pain with a ankle lace up wrap and followup with her primary care Dr. Also Naprosyn and hydrocodone for that in the tooth. Chest pain or palpitations followup will be with her primary care doctor Dr. Lodema Hong.   I personally performed the services described in this documentation, which was scribed in my presence. The recorded information has been reviewed and considered.     Shelda Jakes, MD 09/16/11 [phone]

## 2011-09-18 ENCOUNTER — Other Ambulatory Visit: Payer: Self-pay | Admitting: Family Medicine

## 2011-09-20 ENCOUNTER — Telehealth: Payer: Self-pay

## 2011-09-20 MED ORDER — HYDROCODONE-ACETAMINOPHEN 7.5-750 MG PO TABS: 1.0000 | ORAL_TABLET | Freq: Every evening | ORAL | Status: DC | PRN

## 2011-09-20 NOTE — Telephone Encounter (Signed)
Wants a refill on her hydrocodone. Twisted her ankle and went to the ER and they were going to give her some more but she told them she had a refill but when she called the pharmacy she did not. Ok to refill?

## 2011-09-20 NOTE — Telephone Encounter (Signed)
Refill x1, pls and let her know

## 2011-10-30 ENCOUNTER — Telehealth: Payer: Self-pay | Admitting: Family Medicine

## 2011-10-30 ENCOUNTER — Other Ambulatory Visit: Payer: Self-pay | Admitting: Family Medicine

## 2011-10-30 NOTE — Telephone Encounter (Signed)
pls call pt and let her know med is prescribed then fax.  LET her know she needs to schedule visit for her physical with me,she has had  none since 2011, she has no appointments in the system

## 2011-10-31 ENCOUNTER — Encounter: Payer: Self-pay | Admitting: Family Medicine

## 2011-10-31 ENCOUNTER — Other Ambulatory Visit (HOSPITAL_COMMUNITY)
Admission: RE | Admit: 2011-10-31 | Discharge: 2011-10-31 | Disposition: A | Discharge status: Home / Self Care | Payer: Self-pay | Source: Ambulatory Visit | Attending: Family Medicine | Admitting: Family Medicine

## 2011-10-31 ENCOUNTER — Ambulatory Visit (INDEPENDENT_AMBULATORY_CARE_PROVIDER_SITE_OTHER): Payer: Self-pay | Admitting: Family Medicine

## 2011-10-31 VITALS — BP 120/82 | HR 55 | Temp 99.2°F | Resp 16 | Ht 65.0 in | Wt 190.0 lb

## 2011-10-31 DIAGNOSIS — N949 Unspecified condition associated with female genital organs and menstrual cycle: Secondary | ICD-10-CM

## 2011-10-31 DIAGNOSIS — K089 Disorder of teeth and supporting structures, unspecified: Secondary | ICD-10-CM

## 2011-10-31 DIAGNOSIS — M549 Dorsalgia, unspecified: Secondary | ICD-10-CM

## 2011-10-31 DIAGNOSIS — Z1211 Encounter for screening for malignant neoplasm of colon: Secondary | ICD-10-CM

## 2011-10-31 DIAGNOSIS — M25579 Pain in unspecified ankle and joints of unspecified foot: Secondary | ICD-10-CM

## 2011-10-31 DIAGNOSIS — R102 Pelvic and perineal pain: Secondary | ICD-10-CM

## 2011-10-31 DIAGNOSIS — Z1239 Encounter for other screening for malignant neoplasm of breast: Secondary | ICD-10-CM

## 2011-10-31 DIAGNOSIS — Z0000 Encounter for general adult medical examination without abnormal findings: Secondary | ICD-10-CM

## 2011-10-31 DIAGNOSIS — Z01419 Encounter for gynecological examination (general) (routine) without abnormal findings: Secondary | ICD-10-CM

## 2011-10-31 DIAGNOSIS — Z124 Encounter for screening for malignant neoplasm of cervix: Secondary | ICD-10-CM

## 2011-10-31 DIAGNOSIS — M25572 Pain in left ankle and joints of left foot: Secondary | ICD-10-CM

## 2011-10-31 DIAGNOSIS — K0889 Other specified disorders of teeth and supporting structures: Secondary | ICD-10-CM

## 2011-10-31 LAB — POC HEMOCCULT BLD/STL (OFFICE/1-CARD/DIAGNOSTIC): Fecal Occult Blood, POC: NEGATIVE

## 2011-10-31 MED ORDER — CLINDAMYCIN HCL 300 MG PO CAPS: 300.0000 mg | ORAL_CAPSULE | Freq: Three times a day (TID) | ORAL | Status: DC

## 2011-10-31 MED ORDER — HYDROCODONE-ACETAMINOPHEN 7.5-750 MG PO TABS: 1.0000 | ORAL_TABLET | Freq: Every evening | ORAL | Status: DC | PRN

## 2011-10-31 MED ORDER — IBUPROFEN 800 MG PO TABS: 800.0000 mg | ORAL_TABLET | Freq: Three times a day (TID) | ORAL | Status: DC | PRN

## 2011-10-31 NOTE — Telephone Encounter (Signed)
Pt aware.

## 2011-10-31 NOTE — Patient Instructions (Addendum)
F/u in 6 to 12 months , as needed  Please try and put in a request to get a free mammopgram later this year, since you are currently uninsured.  Ibuprofen is sent in for ankle pain and toothache.  vicodin is prescribed to be used sparingly.  Antibiotic has been prescribed for tooth infection, also fluconazole.  Pap is sent  It is important that you exercise regularly at least 30 minutes 5 times a week. If you develop chest pain, have severe difficulty breathing, or feel very tired, stop exercising immediately and seek medical attention   Use benadryl one at bedtime for itch, likely due to anxiety, if still a problem add zyrtec, this is over the counter

## 2011-11-01 ENCOUNTER — Encounter (HOSPITAL_COMMUNITY): Payer: Self-pay | Admitting: *Deleted

## 2011-11-01 ENCOUNTER — Emergency Department (HOSPITAL_COMMUNITY)
Admission: EM | Admit: 2011-11-01 | Discharge: 2011-11-01 | Disposition: A | Discharge status: Home / Self Care | Payer: Self-pay | Attending: Emergency Medicine | Admitting: Emergency Medicine

## 2011-11-01 ENCOUNTER — Emergency Department (HOSPITAL_COMMUNITY): Payer: Self-pay

## 2011-11-01 ENCOUNTER — Telehealth: Payer: Self-pay | Admitting: Family Medicine

## 2011-11-01 DIAGNOSIS — R220 Localized swelling, mass and lump, head: Secondary | ICD-10-CM

## 2011-11-01 DIAGNOSIS — Z6832 Body mass index (BMI) 32.0-32.9, adult: Secondary | ICD-10-CM | POA: Insufficient documentation

## 2011-11-01 DIAGNOSIS — F17200 Nicotine dependence, unspecified, uncomplicated: Secondary | ICD-10-CM | POA: Insufficient documentation

## 2011-11-01 DIAGNOSIS — K089 Disorder of teeth and supporting structures, unspecified: Secondary | ICD-10-CM | POA: Insufficient documentation

## 2011-11-01 DIAGNOSIS — E669 Obesity, unspecified: Secondary | ICD-10-CM | POA: Insufficient documentation

## 2011-11-01 DIAGNOSIS — K649 Unspecified hemorrhoids: Secondary | ICD-10-CM | POA: Insufficient documentation

## 2011-11-01 DIAGNOSIS — K0889 Other specified disorders of teeth and supporting structures: Secondary | ICD-10-CM

## 2011-11-01 LAB — DIFFERENTIAL
Basophils Absolute: 0 10*3/uL (ref 0.0–0.1)
Lymphocytes Relative: 25 % (ref 12–46)
Lymphs Abs: 3.2 10*3/uL (ref 0.7–4.0)
Monocytes Absolute: 0.9 10*3/uL (ref 0.1–1.0)
Neutro Abs: 8.5 10*3/uL — ABNORMAL HIGH (ref 1.7–7.7)

## 2011-11-01 LAB — CBC
HCT: 39.2 % (ref 36.0–46.0)
Hemoglobin: 13.6 g/dL (ref 12.0–15.0)
RBC: 4.18 MIL/uL (ref 3.87–5.11)
RDW: 13.4 % (ref 11.5–15.5)
WBC: 12.8 10*3/uL — ABNORMAL HIGH (ref 4.0–10.5)

## 2011-11-01 LAB — BASIC METABOLIC PANEL
CO2: 24 mEq/L (ref 19–32)
Chloride: 100 mEq/L (ref 96–112)
Creatinine, Ser: 0.67 mg/dL (ref 0.50–1.10)

## 2011-11-01 MED ORDER — IOHEXOL 300 MG/ML  SOLN
100.0000 mL | Freq: Once | INTRAMUSCULAR | Status: AC | PRN
  Administered 2011-11-01: 100 mL via INTRAVENOUS

## 2011-11-01 MED ORDER — IBUPROFEN 800 MG PO TABS
800.0000 mg | ORAL_TABLET | Freq: Once | ORAL | Status: DC
  Filled 2011-11-01: qty 1

## 2011-11-01 MED ORDER — SODIUM CHLORIDE 0.9 % IV SOLN
Freq: Once | INTRAVENOUS | Status: AC
  Administered 2011-11-01: 18:00:00 via INTRAVENOUS

## 2011-11-01 MED ORDER — HYDROMORPHONE HCL PF 1 MG/ML IJ SOLN
1.0000 mg | Freq: Once | INTRAMUSCULAR | Status: AC
  Administered 2011-11-01: 1 mg via INTRAVENOUS
  Filled 2011-11-01: qty 1

## 2011-11-01 MED ORDER — CLINDAMYCIN PHOSPHATE 600 MG/50ML IV SOLN
600.0000 mg | Freq: Once | INTRAVENOUS | Status: AC
  Administered 2011-11-01: 600 mg via INTRAVENOUS
  Filled 2011-11-01: qty 50

## 2011-11-01 MED ORDER — OXYCODONE-ACETAMINOPHEN 5-325 MG PO TABS: 2.0000 | ORAL_TABLET | ORAL | Status: DC | PRN

## 2011-11-01 NOTE — ED Notes (Signed)
Pt states jaw/neck and tooth continues to hurt. Dr Manus Gunning notified.

## 2011-11-01 NOTE — ED Provider Notes (Signed)
History   This chart was scribed for Glynn Octave, MD by Charolett Bumpers . The patient was seen in room APA06/APA06.    CSN: [id number]  Arrival date & time 11/01/11  1620   First MD Initiated Contact with Patient 11/01/11 1641      Chief Complaint  Patient presents with  . Dental Pain    (Consider location/radiation/quality/duration/timing/severity/associated sxs/prior treatment) HPI Anna Cantu is a 46 y.o. female who presents to the Emergency Department complaining of constant, moderate left lower dental pain for the past couple of days. Patient reports associated swelling that worsened today. Patient also reports associated fever and chills that occurred last night. Patient states that she had a tooth ache a month ago and was treated with antibiotics which resolved the pain. Patient states that the pain returned a couple of days ago and was seen by Dr. Lodema Hong yesterday and started Clindamycin yesterday. Patient states that she has a dental appointment, but states they will only see her after the abscess is resolved. Patient denies any h/o diabetes. Patient denies any other medical problems. Patient states that she took hydrocodone and motrin for pain with some relief.    Past Medical History  Diagnosis Date  . Chronic back pain   . Nicotine addiction   . Obesity   . Hemorrhoids   . Gastritis     H/O remote EGD greater than 10 years ago  . Ovarian cyst     Past Surgical History  Procedure Date  . Right inguinal hernia repair 1982  . Right carpal tunnel release 1991  . Right knee surgery 1993  . Btl and endometrial abaltion   . Cholecystectomy 2007    Dr. Franky Macho   . Left knee surgey- arthroscopy june 2011     Dr. Eulah Pont   . Tubal ligation     No family history on file.  History  Substance Use Topics  . Smoking status: Current Everyday Smoker -- 0.5 packs/day  . Smokeless tobacco: Not on file  . Alcohol Use: Yes    OB History    Grav  Para Term Preterm Abortions TAB SAB Ect Mult Living                  Review of Systems  Constitutional: Positive for fever and chills.  HENT: Positive for dental problem.   Gastrointestinal: Negative for vomiting.  All other systems reviewed and are negative.    Allergies  Amoxicillin  Home Medications   Current Outpatient Rx  Name Route Sig Dispense Refill  . VITAMIN C PO Oral Take 1 tablet by mouth daily.    Marland Kitchen CLINDAMYCIN HCL 300 MG PO CAPS Oral Take 1 capsule (300 mg total) by mouth 3 (three) times daily. 30 capsule 0  . CYCLOBENZAPRINE HCL 10 MG PO TABS Oral Take 10 mg by mouth daily as needed. For muscle spasms    . OMEGA-3 FATTY ACIDS 1000 MG PO CAPS Oral Take 1 g by mouth daily.     . FUROSEMIDE 20 MG PO TABS Oral Take 20 mg by mouth daily. Take one tablet as needed for swelling     . GLUCOSAMINE 1500 COMPLEX PO Oral Take 1 tablet by mouth daily.    Marland Kitchen HYDROCODONE-ACETAMINOPHEN 7.5-750 MG PO TABS Oral Take 1 tablet by mouth at bedtime as needed. TAKE ONE TABLET BY MOUTH AT BEDTIME AS NEEDED FOR PAIN 30 tablet 0  . IBUPROFEN 800 MG PO TABS Oral Take 1 tablet (800 mg  total) by mouth every 8 (eight) hours as needed for pain. 30 tablet 1  . ADULT MULTIVITAMIN W/MINERALS CH Oral Take 1 tablet by mouth daily.    Marland Kitchen ZINC PO Oral Take 1 tablet by mouth daily.    Marland Kitchen NAPROXEN 500 MG PO TABS Oral Take 1 tablet (500 mg total) by mouth 2 (two) times daily. 14 tablet 0  . OMEPRAZOLE 20 MG PO CPDR Oral Take 20 mg by mouth daily as needed. For acid reflux    . POTASSIUM CHLORIDE CRYS ER 20 MEQ PO TBCR Oral Take 20 mEq by mouth daily. Take one daily with lasix (furosemide)     . DIPHENHYDRAMINE HCL 25 MG PO TABS Oral Take 50 mg by mouth daily as needed. For itching    . FLUCONAZOLE 150 MG PO TABS  One tablet once daily as needed 2 tablet 0  . PSEUDOEPHEDRINE HCL 60 MG PO TABS Oral Take 60 mg by mouth daily as needed. For allergy symptoms    . SILVER SULFADIAZINE 1 % EX CREA  Apply to affected  area daily 50 g 0    BP 125/73  Pulse 114  Temp(Src) 99.4 F (37.4 C) (Oral)  Resp 20  Ht 5\' 4"  (1.626 m)  Wt 190 lb (86.183 kg)  BMI 32.61 kg/m2  SpO2 100%  LMP 10/24/2011  Physical Exam  Nursing note and vitals reviewed. Constitutional: She is oriented to person, place, and time. She appears well-developed and well-nourished. No distress.  HENT:  Head: Normocephalic and atraumatic.       Left lower 2nd molar tender to palpation. 1st molar missing. Left submandibular 2cm nodule noted, tender to palpation, no fluctuance noted. NO tongue elevation, floor of mouth soft.  Eyes: EOM are normal. Pupils are equal, round, and reactive to light.  Neck: Neck supple. No tracheal deviation present.  Cardiovascular: Normal rate.   Pulmonary/Chest: Effort normal. No respiratory distress.  Abdominal: Soft. She exhibits no distension.  Musculoskeletal: Normal range of motion. She exhibits no edema.  Neurological: She is alert and oriented to person, place, and time. No sensory deficit.  Skin: Skin is warm and dry.  Psychiatric: She has a normal mood and affect. Her behavior is normal.    ED Course  Procedures (including critical care time)  DIAGNOSTIC STUDIES: Oxygen Saturation is 100% on room air, normal by my interpretation.    COORDINATION OF CARE:  1649: Discussed planned course of treatment with the patient, who is agreeable at this time.  1700: Medication Orders: Clindamycin (Cleocin) IVPB 600 mg-once.  1841: Discussed imaging and lab results. Discussed f/u with dentist. Discussed continuing antibiotic. Discussed strict return precautions.    Labs Reviewed  CBC - Abnormal; Notable for the following:    WBC 12.8 (*)    All other components within normal limits  DIFFERENTIAL - Abnormal; Notable for the following:    Neutro Abs 8.5 (*)    All other components within normal limits  BASIC METABOLIC PANEL - Abnormal; Notable for the following:    Glucose, Bld 115 (*)    BUN 5  (*)    All other components within normal limits   Ct Soft Tissue Neck W Contrast  11/01/2011  *RADIOLOGY REPORT*  Clinical Data: Left lower jaw/teeth pain  CT NECK WITH CONTRAST  Technique:  Multidetector CT imaging of the neck was performed with intravenous contrast.  Contrast: OMNIPAQUE IOHEXOL 300 MG/ML  SOLN  Comparison: None.  Findings: No evidence of odontogenic abscess or apical  lucency.  No evidence of peritonsillar abscess.  The nasopharynx remains patent.  No suspicious cervical lymphadenopathy.  The visualized paranasal sinuses and mastoid air cells are clear. No evidence of maxillofacial fracture.  Visualized thyroid is within normal limits.  Visualized lungs are clear.  Visualized brain parenchyma is within normal limits.  IMPRESSION: No evidence of odontogenic abscess.  Unremarkable CT neck.  Original Report Authenticated By: Charline Bills, M.D.     No diagnosis found.    MDM  Left lower molar pain for the past several days now with submandibular swelling. No fever, vomiting, drooling, difficulty breathing or swallowing. Airway patent, no distress  No evidence of abscess on imaging. Presume palpable submandibular nodule is a lymph node. Patient is tolerating by mouth in the ED she has no trismus, floor of mouth is soft. She has no evidence of those angina. She is stable for outpatient followup with her dentist.  I personally performed the services described in this documentation, which was scribed in my presence.  The recorded information has been reviewed and considered.       Glynn Octave, MD 11/01/11 1905

## 2011-11-01 NOTE — ED Notes (Addendum)
Pt returns to Ed with left jaw and facial swelling secondary to tooth abscess. Pt reports being treated here 1 month prior for same c/o, for symptoms to only worsen. Pt has  dental care scheduled for Monday.. Pt has low grade fever. Denies n/v/d, and generalized weakness.

## 2011-11-01 NOTE — Telephone Encounter (Signed)
Pt states she is having increased swelling of throat and neck, states she was unable to swallow at all yesterday, advised re eval at ED

## 2011-11-01 NOTE — Discharge Instructions (Signed)
Dental Pain Continue antibiotics and pain medication as prescribed. Followup with her dentist. Return to the ED if you develop difficulty breathing, difficulty swallowing, drooling, fevers, inability to eat or any other worsening symptoms. A tooth ache may be caused by cavities (tooth decay). Cavities expose the nerve of the tooth to air and hot or cold temperatures. It may come from an infection or abscess (also called a boil or furuncle) around your tooth. It is also often caused by dental caries (tooth decay). This causes the pain you are having. DIAGNOSIS  Your caregiver can diagnose this problem by exam. TREATMENT   If caused by an infection, it may be treated with medications which kill germs (antibiotics) and pain medications as prescribed by your caregiver. Take medications as directed.   Only take over-the-counter or prescription medicines for pain, discomfort, or fever as directed by your caregiver.   Whether the tooth ache today is caused by infection or dental disease, you should see your dentist as soon as possible for further care.  SEEK MEDICAL CARE IF: The exam and treatment you received today has been provided on an emergency basis only. This is not a substitute for complete medical or dental care. If your problem worsens or new problems (symptoms) appear, and you are unable to meet with your dentist, call or return to this location. SEEK IMMEDIATE MEDICAL CARE IF:   You have a fever.   You develop redness and swelling of your face, jaw, or neck.   You are unable to open your mouth.   You have severe pain uncontrolled by pain medicine.  MAKE SURE YOU:   Understand these instructions.   Will watch your condition.   Will get help right away if you are not doing well or get worse.  Document Released: 05/13/2005 Document Revised: 05/02/2011 Document Reviewed: 12/30/2007 [facility] Patient Information 2012 [place], [place].

## 2011-11-01 NOTE — ED Notes (Addendum)
Pt c/o toothache, swelling to left side of neck, states that she had a toothache a month ago, was tx with antibiotics, got better, started having pain again a week ago, was seen by Dr. Lodema Hong yesterday given antibiotics again, woke up with swelling to neck area this am, trouble swallowing food when eating

## 2011-11-01 NOTE — Telephone Encounter (Signed)
Still in bad pain, swollen and hurting so bad. Pain med not helping. Dentist won't do anything until swelling goes down. Wants med for sleep. Or wants to see if you thought they could do anything for her at the ER. If not, she will just keep taking the meds and try to keep applying ice and try to get some rest [facility]

## 2011-11-02 DIAGNOSIS — K0889 Other specified disorders of teeth and supporting structures: Secondary | ICD-10-CM | POA: Insufficient documentation

## 2011-11-02 DIAGNOSIS — Z0000 Encounter for general adult medical examination without abnormal findings: Secondary | ICD-10-CM | POA: Insufficient documentation

## 2011-11-02 DIAGNOSIS — M25572 Pain in left ankle and joints of left foot: Secondary | ICD-10-CM | POA: Insufficient documentation

## 2011-11-02 NOTE — Assessment & Plan Note (Signed)
Full rom of affected joint, no erythema or swelling, sprain from several  Months back, anti inflammatory prescribed

## 2011-11-02 NOTE — Assessment & Plan Note (Signed)
Pelvic and breast exam normal. Counseled re importance of regular exercise, weight loss and smoking cessation

## 2011-11-02 NOTE — Progress Notes (Signed)
  Subjective:    Patient ID: Anna Cantu, female    DOB: Feb 05, 1966, 46 y.o.   MRN: [id number]  HPI Pt comes in with primary c/o left jaw pain causing difficulty even swallowing, states she has appointment with dentist next week, but no manipulation will be done before infection is treated. Still smoking , experiencing a lot of stress, currently taking classes for CNA, has been unemployed for about 8 month. Requests pelvic and breast exam since past due   Review of Systems See HPI Denies recent fever or chills. Denies sinus pressure, nasal congestion,c/o left  ear pain and difficulty swallowing Denies chest congestion, productive cough or wheezing. Denies chest pains, palpitations and leg swelling Denies abdominal pain, nausea, vomiting,diarrhea or constipation.   Denies dysuria, frequency, hesitancy or incontinence.  c/o ongoing left ankle pain following sprain several months ago Denies headaches, seizures, numbness, or tingling. Denies uncontrolled  depression, anxiety or insomnia. Denies skin break down or rash.        Objective:   Physical Exam  Pleasant well nourished female, alert and oriented x 3, in no cardio-pulmonary distress. Afebrile. HEENT No facial trauma or asymetry. Sinuses non tender.  EOMI, PERTL, fundoscopic exam  no hemorhage or exudate.  External ears normal, tympanic membranes clear. Oropharynx moist, no exudate, dental pain on percussion of lower left molar, no obvious gum infection Neck: supple,anterior cervical adenopathy,JVD or thyromegaly.No bruits.  Chest: Clear to ascultation bilaterally.No crackles or wheezes.Decreased air entry though adequate Non tender to palpation  Breast: No asymetry,no masses. No nipple discharge or inversion. No axillary or supraclavicular adenopathy  Cardiovascular system; Heart sounds normal,  S1 and  S2 ,no S3.  No murmur, or thrill. Apical beat not displaced Peripheral pulses  normal.  Abdomen: Soft, non tender, no organomegaly or masses. No bruits. Bowel sounds normal. No guarding, tenderness or rebound.  Rectal:  No mass. Guaiac negative stool.  GU: External genitalia normal. No lesions. Vaginal canal normal .white  discharge. Uterus normal size, no adnexal masses, no cervical motion or adnexal tenderness.  Musculoskeletal exam: Full ROM of spine, hips , shoulders and knees.mildly reduced ROM left ankle, no swelling warmth or tenderness No deformity ,swelling or crepitus noted. No muscle wasting or atrophy.   Neurologic: Cranial nerves 2 to 12 intact. Power, tone ,sensation and reflexes normal throughout. No disturbance in gait. No tremor.  Skin: Intact, no ulceration, erythema , scaling or rash noted. Pigmentation normal throughout  Psych; Normal mood and affect. Judgement and concentration normal       Assessment & Plan:

## 2011-11-02 NOTE — Assessment & Plan Note (Signed)
Antibiotic, anti inflammatory and vicodin prescribed. No evidence of airway obstruction or abces on exam

## 2011-11-04 ENCOUNTER — Telehealth: Payer: Self-pay | Admitting: Family Medicine

## 2011-11-04 ENCOUNTER — Emergency Department (HOSPITAL_COMMUNITY)
Admission: EM | Admit: 2011-11-04 | Discharge: 2011-11-04 | Disposition: A | Discharge status: Home / Self Care | Payer: Self-pay | Attending: Emergency Medicine | Admitting: Emergency Medicine

## 2011-11-04 ENCOUNTER — Encounter (HOSPITAL_COMMUNITY): Payer: Self-pay | Admitting: *Deleted

## 2011-11-04 DIAGNOSIS — K047 Periapical abscess without sinus: Secondary | ICD-10-CM

## 2011-11-04 DIAGNOSIS — K089 Disorder of teeth and supporting structures, unspecified: Secondary | ICD-10-CM | POA: Insufficient documentation

## 2011-11-04 DIAGNOSIS — F17200 Nicotine dependence, unspecified, uncomplicated: Secondary | ICD-10-CM | POA: Insufficient documentation

## 2011-11-04 MED ORDER — OXYCODONE-ACETAMINOPHEN 5-325 MG PO TABS: 1.0000 | ORAL_TABLET | ORAL | Status: AC | PRN

## 2011-11-04 NOTE — Telephone Encounter (Signed)
State the swelling is worsening, coughing up bloody sputum, fever and chills , worsening, called in from the parking lot a [facility], I advised since her symptoms are worsening and now developing additional ones, bloody sputum, to be seen in the urgent care or Ed again, may need rept /additional tests

## 2011-11-04 NOTE — ED Provider Notes (Signed)
History     CSN: [id number]  Arrival date & time 11/04/11  1312   First MD Initiated Contact with Patient 11/04/11 1322      1:40 PM HPI Patient reports she was here 3 days and given prescription for clindamycin as well as had a CT scan which was negative. Reports left lower dental pain for several days without improvement. Reports beginning to have swelling in the left neck. States unable to followup with a dentist for her several days. Reports painful to swallow and drink. Denies difficulty breathing. Reports subjective fevers.  Patient is a 46 y.o. female presenting with tooth pain.  Dental PainThe primary symptoms include mouth pain, fever and sore throat. Primary symptoms do not include dental injury, oral bleeding, oral lesions, headaches, shortness of breath, angioedema or cough. The symptoms began 3 to 5 days ago. The symptoms are unchanged. The symptoms are new. The symptoms occur constantly.  The sore throat is accompanied by trouble swallowing.  Additional symptoms include: dental sensitivity to temperature, gum swelling, gum tenderness, jaw pain, facial swelling, trouble swallowing, ear pain and swollen glands. Additional symptoms do not include: purulent gums, trismus, pain with swallowing, excessive salivation and fatigue.    Past Medical History  Diagnosis Date  . Chronic back pain   . Nicotine addiction   . Obesity   . Hemorrhoids   . Gastritis     H/O remote EGD greater than 10 years ago  . Ovarian cyst     Past Surgical History  Procedure Date  . Right inguinal hernia repair 1982  . Right carpal tunnel release 1991  . Right knee surgery 1993  . Btl and endometrial abaltion   . Cholecystectomy 2007    Dr. Franky Macho   . Left knee surgey- arthroscopy june 2011     Dr. Eulah Pont   . Tubal ligation     History reviewed. No pertinent family history.  History  Substance Use Topics  . Smoking status: Current Everyday Smoker -- 0.5 packs/day    Types:  Cigarettes  . Smokeless tobacco: Not on file  . Alcohol Use: Yes    OB History    Grav Para Term Preterm Abortions TAB SAB Ect Mult Living                  Review of Systems  Constitutional: Positive for fever. Negative for fatigue.  HENT: Positive for ear pain, sore throat, facial swelling, trouble swallowing and dental problem.   Respiratory: Negative for cough and shortness of breath.   Neurological: Negative for headaches.  All other systems reviewed and are negative.    Allergies  Amoxicillin  Home Medications   Current Outpatient Rx  Name Route Sig Dispense Refill  . VITAMIN C PO Oral Take 1 tablet by mouth daily.    Marland Kitchen CLINDAMYCIN HCL 300 MG PO CAPS Oral Take 1 capsule (300 mg total) by mouth 3 (three) times daily. 30 capsule 0  . CYCLOBENZAPRINE HCL 10 MG PO TABS Oral Take 10 mg by mouth daily as needed. For muscle spasms    . DIPHENHYDRAMINE HCL 25 MG PO TABS Oral Take 50 mg by mouth daily as needed. For itching    . OMEGA-3 FATTY ACIDS 1000 MG PO CAPS Oral Take 1 g by mouth daily.     Marland Kitchen FLUCONAZOLE 150 MG PO TABS  One tablet once daily as needed 2 tablet 0  . FUROSEMIDE 20 MG PO TABS Oral Take 20 mg by mouth daily. Take  one tablet as needed for swelling     . GLUCOSAMINE 1500 COMPLEX PO Oral Take 1 tablet by mouth daily.    Marland Kitchen HYDROCODONE-ACETAMINOPHEN 7.5-750 MG PO TABS Oral Take 1 tablet by mouth at bedtime as needed. TAKE ONE TABLET BY MOUTH AT BEDTIME AS NEEDED FOR PAIN 30 tablet 0  . IBUPROFEN 800 MG PO TABS Oral Take 1 tablet (800 mg total) by mouth every 8 (eight) hours as needed for pain. 30 tablet 1  . ADULT MULTIVITAMIN W/MINERALS CH Oral Take 1 tablet by mouth daily.    Marland Kitchen ZINC PO Oral Take 1 tablet by mouth daily.    Marland Kitchen NAPROXEN 500 MG PO TABS Oral Take 1 tablet (500 mg total) by mouth 2 (two) times daily. 14 tablet 0  . OMEPRAZOLE 20 MG PO CPDR Oral Take 20 mg by mouth daily as needed. For acid reflux    . OXYCODONE-ACETAMINOPHEN 5-325 MG PO TABS Oral Take  2 tablets by mouth every 4 (four) hours as needed for pain. 15 tablet 0  . POTASSIUM CHLORIDE CRYS ER 20 MEQ PO TBCR Oral Take 20 mEq by mouth daily. Take one daily with lasix (furosemide)     . PSEUDOEPHEDRINE HCL 60 MG PO TABS Oral Take 60 mg by mouth daily as needed. For allergy symptoms    . SILVER SULFADIAZINE 1 % EX CREA  Apply to affected area daily 50 g 0    BP 125/81  Pulse 97  Temp(Src) 100.1 F (37.8 C) (Oral)  Resp 18  SpO2 100%  LMP 10/24/2011  Physical Exam  Vitals reviewed. Constitutional: She is oriented to person, place, and time. Vital signs are normal. She appears well-developed and well-nourished.  HENT:  Head: Normocephalic and atraumatic.  Right Ear: Tympanic membrane, external ear and ear canal normal.  Left Ear: Tympanic membrane, external ear and ear canal normal.  Nose: Nose normal.  Mouth/Throat:         Left lower second molar tender to palpation and percussion. No fluctuance obvious dental abscess palpated however patient has a large tender swollen left lower jaw. Suspect dental abscess. Prior CT scan indicates no abscess.  Eyes: Conjunctivae are normal. Pupils are equal, round, and reactive to light.  Neck: Normal range of motion. Neck supple. No spinous process tenderness and no muscular tenderness present. No rigidity. Normal range of motion present.    Cardiovascular: Normal rate, regular rhythm and normal heart sounds.  Exam reveals no friction rub.   No murmur heard. Pulmonary/Chest: Effort normal and breath sounds normal. She has no wheezes. She has no rhonchi. She has no rales. She exhibits no tenderness.  Musculoskeletal: Normal range of motion.  Neurological: She is alert and oriented to person, place, and time.  Skin: Skin is warm and dry. No rash noted. No erythema. No pallor.    ED Course  Procedures  MDM   Patient reports she's been taking antibiotics; this is the third day.Reports Vicodin has not helped pain. States still does not  have close followup with dentist. Patient is on the correct antibiotic coverage I do not feel patient allowed sufficient time for antibiotics to begin working. Will refer patient to Dr. Russella Dar or Dr. Chales Salmon for close evaluation and likely dental abscess. Patient currently is able to eat and is speaking without difficulty. Do not feel she is currently at risk of airway compression. Advised to call Dr.Benitez or Dr. Chales Salmon as soon as she leaves here today. Patient voices understanding and is ready for  Thomasene Lot, PA-C 11/04/11 1408

## 2011-11-04 NOTE — ED Notes (Signed)
Pt c/o sore throat and pain with swallowing x's 1 month, states has had CT and "nothing showed up."

## 2011-11-04 NOTE — Discharge Instructions (Signed)
Dental Abscess A dental abscess usually starts from an infected tooth. Antibiotic medicine and pain pills can be helpful, but dental infections require the attention of a dentist. Rinse around the infected area often with salt water (a pinch of salt in 8 oz of warm water). Do not apply heat to the outside of your face. See your dentist or oral surgeon as soon as possible.  SEEK IMMEDIATE MEDICAL CARE IF:  You have increasing, severe pain that is not relieved by medicine.   You or your child has an oral temperature above 102 F (38.9 C), not controlled by medicine.   Your baby is older than 3 months with a rectal temperature of 102 F (38.9 C) or higher.   Your baby is 3 months old or younger with a rectal temperature of 100.4 F (38 C) or higher.   You develop chills, severe headache, difficulty breathing, or trouble swallowing.   You have swelling in the neck or around the eye.  Document Released: 05/13/2005 Document Revised: 05/02/2011 Document Reviewed: 10/22/2006 [employer] Patient Information 2012 [employer]. 

## 2011-11-05 NOTE — ED Provider Notes (Signed)
Medical screening examination/treatment/procedure(s) were performed by non-physician practitioner and as supervising physician I was immediately available for consultation/collaboration.    Celene Kras, MD 11/05/11 0700

## 2011-11-08 ENCOUNTER — Other Ambulatory Visit: Payer: Self-pay

## 2011-11-08 MED ORDER — FLUCONAZOLE 150 MG PO TABS: 150.0000 mg | ORAL_TABLET | Freq: Once | ORAL | Status: AC

## 2011-11-20 ENCOUNTER — Telehealth: Payer: Self-pay | Admitting: Family Medicine

## 2011-11-21 NOTE — Telephone Encounter (Signed)
pls spk with pt and document her concerns

## 2011-11-22 NOTE — Telephone Encounter (Signed)
Called and left message for pt to return call.  

## 2011-12-05 ENCOUNTER — Other Ambulatory Visit: Payer: Self-pay | Admitting: Family Medicine

## 2011-12-05 ENCOUNTER — Telehealth: Payer: Self-pay | Admitting: Family Medicine

## 2011-12-05 DIAGNOSIS — M549 Dorsalgia, unspecified: Secondary | ICD-10-CM

## 2011-12-05 NOTE — Telephone Encounter (Signed)
Refill x 1 please 

## 2011-12-05 NOTE — Telephone Encounter (Signed)
They are for chronic back pain

## 2011-12-05 NOTE — Telephone Encounter (Signed)
Is it ok to refill these meds? Were they for her abscess?

## 2011-12-06 MED ORDER — IBUPROFEN 800 MG PO TABS: 800.0000 mg | ORAL_TABLET | Freq: Three times a day (TID) | ORAL | Status: AC | PRN

## 2011-12-06 MED ORDER — CYCLOBENZAPRINE HCL 10 MG PO TABS: 10.0000 mg | ORAL_TABLET | Freq: Every day | ORAL | Status: DC | PRN

## 2011-12-06 MED ORDER — HYDROCODONE-ACETAMINOPHEN 7.5-750 MG PO TABS: 1.0000 | ORAL_TABLET | Freq: Every evening | ORAL | Status: DC | PRN

## 2011-12-10 NOTE — Telephone Encounter (Signed)
Pt wants to speak with physician

## 2011-12-10 NOTE — Telephone Encounter (Signed)
States she is trying to see if she will qualify for disability, knees are swelling, states that even after  after a massage her body is in pain and knots, also she is breaking out in hives Advised her to contact social services and that she would be examined by an MD who specializes in disability determination, I let her know these records would be available if needed. I also advised that she look for work, since in my opinion , she will not qualify for disability, she agreed

## 2012-01-02 ENCOUNTER — Telehealth: Payer: Self-pay | Admitting: Family Medicine

## 2012-01-03 ENCOUNTER — Other Ambulatory Visit: Payer: Self-pay

## 2012-01-03 DIAGNOSIS — M549 Dorsalgia, unspecified: Secondary | ICD-10-CM

## 2012-01-03 MED ORDER — HYDROCODONE-ACETAMINOPHEN 7.5-750 MG PO TABS: 1.0000 | ORAL_TABLET | Freq: Every evening | ORAL | Status: DC | PRN

## 2012-01-03 NOTE — Telephone Encounter (Signed)
Med refilled.

## 2012-03-31 ENCOUNTER — Other Ambulatory Visit: Payer: Self-pay | Admitting: Family Medicine

## 2012-03-31 ENCOUNTER — Telehealth: Payer: Self-pay | Admitting: Family Medicine

## 2012-03-31 MED ORDER — ALPRAZOLAM 0.5 MG PO TABS: ORAL_TABLET | ORAL | Status: DC

## 2012-03-31 NOTE — Telephone Encounter (Signed)
pls let her know I am sorry to hear of the stroke. Xanax is printed pls fax in and let her know. For itching she can use OTC benadryl 25 mg one to two capsules, or OTC zyrtec one daily, does not need a script

## 2012-03-31 NOTE — Telephone Encounter (Signed)
Called and left voicemail for patient.

## 2012-04-09 ENCOUNTER — Other Ambulatory Visit: Payer: Self-pay | Admitting: Family Medicine

## 2012-05-31 ENCOUNTER — Emergency Department (HOSPITAL_COMMUNITY)
Admission: EM | Admit: 2012-05-31 | Discharge: 2012-05-31 | Disposition: A | Discharge status: Home / Self Care | Payer: Self-pay | Attending: Emergency Medicine | Admitting: Emergency Medicine

## 2012-05-31 ENCOUNTER — Emergency Department (HOSPITAL_COMMUNITY): Payer: Self-pay

## 2012-05-31 ENCOUNTER — Encounter (HOSPITAL_COMMUNITY): Payer: Self-pay | Admitting: Emergency Medicine

## 2012-05-31 DIAGNOSIS — R509 Fever, unspecified: Secondary | ICD-10-CM | POA: Insufficient documentation

## 2012-05-31 DIAGNOSIS — Z8719 Personal history of other diseases of the digestive system: Secondary | ICD-10-CM | POA: Insufficient documentation

## 2012-05-31 DIAGNOSIS — J3489 Other specified disorders of nose and nasal sinuses: Secondary | ICD-10-CM | POA: Insufficient documentation

## 2012-05-31 DIAGNOSIS — Z79899 Other long term (current) drug therapy: Secondary | ICD-10-CM | POA: Insufficient documentation

## 2012-05-31 DIAGNOSIS — E669 Obesity, unspecified: Secondary | ICD-10-CM | POA: Insufficient documentation

## 2012-05-31 DIAGNOSIS — F17200 Nicotine dependence, unspecified, uncomplicated: Secondary | ICD-10-CM | POA: Insufficient documentation

## 2012-05-31 DIAGNOSIS — R05 Cough: Secondary | ICD-10-CM | POA: Insufficient documentation

## 2012-05-31 DIAGNOSIS — R059 Cough, unspecified: Secondary | ICD-10-CM | POA: Insufficient documentation

## 2012-05-31 DIAGNOSIS — Z8742 Personal history of other diseases of the female genital tract: Secondary | ICD-10-CM | POA: Insufficient documentation

## 2012-05-31 DIAGNOSIS — J40 Bronchitis, not specified as acute or chronic: Secondary | ICD-10-CM | POA: Insufficient documentation

## 2012-05-31 LAB — CBC WITH DIFFERENTIAL/PLATELET
Basophils Relative: 0 % (ref 0–1)
Eosinophils Absolute: 0.1 10*3/uL (ref 0.0–0.7)
Lymphocytes Relative: 39 % (ref 12–46)
Monocytes Absolute: 0.4 10*3/uL (ref 0.1–1.0)
Monocytes Relative: 4 % (ref 3–12)
Neutro Abs: 5.1 10*3/uL (ref 1.7–7.7)
Neutrophils Relative %: 56 % (ref 43–77)
Platelets: 336 10*3/uL (ref 150–400)
RBC: 4.23 MIL/uL (ref 3.87–5.11)
RDW: 13 % (ref 11.5–15.5)
WBC: 9.2 10*3/uL (ref 4.0–10.5)

## 2012-05-31 LAB — COMPREHENSIVE METABOLIC PANEL
Albumin: 3.7 g/dL (ref 3.5–5.2)
BUN: 6 mg/dL (ref 6–23)
Creatinine, Ser: 0.66 mg/dL (ref 0.50–1.10)
GFR calc Af Amer: >90 mL/min (ref >90)
Glucose, Bld: 98 mg/dL (ref 70–99)
Total Bilirubin: 0.3 mg/dL (ref 0.3–1.2)
Total Protein: 7 g/dL (ref 6.0–8.3)

## 2012-05-31 LAB — URINALYSIS, ROUTINE W REFLEX MICROSCOPIC
Glucose, UA: NEGATIVE mg/dL
Leukocytes, UA: NEGATIVE
Nitrite: NEGATIVE
Protein, ur: NEGATIVE mg/dL
pH: 7 (ref 5.0–8.0)

## 2012-05-31 MED ORDER — AZITHROMYCIN 250 MG PO TABS: 250.0000 mg | ORAL_TABLET | Freq: Every day | ORAL | Status: DC

## 2012-05-31 MED ORDER — SALINE NASAL SPRAY 0.65 % NA SOLN: 1.0000 | NASAL | Status: DC | PRN

## 2012-05-31 MED ORDER — SODIUM CHLORIDE 0.9 % IV BOLUS (SEPSIS)
1000.0000 mL | Freq: Once | INTRAVENOUS | Status: AC
  Administered 2012-05-31: 1000 mL via INTRAVENOUS

## 2012-05-31 MED ORDER — IBUPROFEN 600 MG PO TABS: 600.0000 mg | ORAL_TABLET | Freq: Four times a day (QID) | ORAL | Status: DC | PRN

## 2012-05-31 MED ORDER — BENZONATATE 100 MG PO CAPS: 100.0000 mg | ORAL_CAPSULE | Freq: Three times a day (TID) | ORAL | Status: DC

## 2012-05-31 NOTE — ED Provider Notes (Addendum)
History     CSN: [id number]  Arrival date & time 05/31/12  1231   First MD Initiated Contact with Patient 05/31/12 1254      Chief Complaint  Patient presents with  . Shortness of Breath    chest hurts when she coughs and tries to speak  . Cough    (Consider location/radiation/quality/duration/timing/severity/associated sxs/prior treatment) HPI Comments: 47 y/o woman with no med hx comes in with cc of sob and cough. Pt ha seen congested for a week + now, and with that she has a yellow green producing cough and DIB. She has no chest pain. She has been having some low grade fevers as well. She decided to come in as even with home remedies, she is not getting better. No wheezing, no lung dz, no heart dz, and no hx of PE, DVT with no risk factors for the same.   Patient is a 47 y.o. female presenting with shortness of breath and cough. The history is provided by the patient.  Shortness of Breath  Associated symptoms include a fever, rhinorrhea, cough and shortness of breath. Pertinent negatives include no chest pain and no wheezing.  Cough Associated symptoms include rhinorrhea and shortness of breath. Pertinent negatives include no chest pain and no wheezing.    Past Medical History  Diagnosis Date  . Chronic back pain   . Nicotine addiction   . Obesity   . Hemorrhoids   . Gastritis     H/O remote EGD greater than 10 years ago  . Ovarian cyst     Past Surgical History  Procedure Date  . Right inguinal hernia repair 1982  . Right carpal tunnel release 1991  . Right knee surgery 1993  . Btl and endometrial abaltion   . Cholecystectomy 2007    Dr. Franky Macho   . Left knee surgey- arthroscopy june 2011     Dr. Eulah Pont   . Tubal ligation     No family history on file.  History  Substance Use Topics  . Smoking status: Current Some Day Smoker -- 0.5 packs/day    Types: Cigarettes  . Smokeless tobacco: Never Used  . Alcohol Use: Yes    OB History    Grav Para Term  Preterm Abortions TAB SAB Ect Mult Living                  Review of Systems  Constitutional: Positive for fever. Negative for activity change.  HENT: Positive for congestion, rhinorrhea and sinus pressure. Negative for facial swelling and neck pain.   Respiratory: Positive for cough and shortness of breath. Negative for wheezing.   Cardiovascular: Negative for chest pain.  Gastrointestinal: Negative for nausea, vomiting, abdominal pain, diarrhea, constipation, blood in stool and abdominal distention.  Genitourinary: Negative for hematuria and difficulty urinating.  Skin: Negative for color change.  Neurological: Negative for speech difficulty.  Hematological: Does not bruise/bleed easily.  Psychiatric/Behavioral: Negative for confusion.    Allergies  Amoxicillin  Home Medications   Current Outpatient Rx  Name  Route  Sig  Dispense  Refill  . ALPRAZOLAM 0.5 MG PO TABS   Oral   Take 0.5 mg by mouth every evening. One tablet at bedtime, as needed for anxity         . VITAMIN C PO   Oral   Take 1 tablet by mouth daily.         . CYCLOBENZAPRINE HCL 10 MG PO TABS   Oral   Take  1 tablet (10 mg total) by mouth daily as needed. For muscle spasms   30 tablet   0   . DIPHENHYDRAMINE HCL 25 MG PO TABS   Oral   Take 50 mg by mouth daily as needed. For itching         . OMEGA-3 FATTY ACIDS 1000 MG PO CAPS   Oral   Take 1 g by mouth daily.          . FUROSEMIDE 20 MG PO TABS   Oral   Take 20 mg by mouth daily. Take one tablet as needed for swelling          . HYDROCODONE-ACETAMINOPHEN 7.5-750 MG PO TABS   Oral   Take 1 tablet by mouth every 6 (six) hours as needed. Pain         . ADULT MULTIVITAMIN W/MINERALS CH   Oral   Take 1 tablet by mouth daily.         Marland Kitchen POTASSIUM CHLORIDE CRYS ER 20 MEQ PO TBCR   Oral   Take 20 mEq by mouth daily. Take one daily with lasix (furosemide)          . ZINC 15 MG PO CAPS   Oral   Take 1 capsule by mouth daily.             BP 129/74  Pulse 79  Temp 98.8 F (37.1 C) (Oral)  Resp 16  Ht 5\' 4"  (1.626 m)  Wt 170 lb (77.111 kg)  BMI 29.18 kg/m2  SpO2 100%  LMP 05/25/2012  Physical Exam  Nursing note and vitals reviewed. Constitutional: She is oriented to person, place, and time. She appears well-developed and well-nourished.  HENT:  Head: Normocephalic and atraumatic.  Eyes: EOM are normal. Pupils are equal, round, and reactive to light.  Neck: Neck supple. No JVD present.  Cardiovascular: Normal rate, regular rhythm and normal heart sounds.   No murmur heard. Pulmonary/Chest: Effort normal. No respiratory distress.  Abdominal: Soft. She exhibits no distension. There is no tenderness. There is no rebound and no guarding.  Musculoskeletal: She exhibits no edema and no tenderness.  Neurological: She is alert and oriented to person, place, and time.  Skin: Skin is warm and dry.    ED Course  Procedures (including critical care time)   Labs Reviewed  CBC WITH DIFFERENTIAL  URINALYSIS, ROUTINE W REFLEX MICROSCOPIC  COMPREHENSIVE METABOLIC PANEL   Dg Chest 2 View  05/31/2012  *RADIOLOGY REPORT*  Clinical Data: Shortness of breath and flu symptoms.  CHEST - 2 VIEW  Comparison: 09/16/2011  Findings: Two views of the chest were obtained.  Lungs are clear bilaterally. Heart and mediastinum are within normal limits.  Bony thorax is intact.  Trachea is midline.  IMPRESSION: No acute cardiopulmonary disease.   Original Report Authenticated By: Richarda Overlie, M.D.      No diagnosis found.    MDM   Date: 05/31/2012  Rate: 74  Rhythm: normal sinus rhythm  QRS Axis: normal  Intervals: normal  ST/T Wave abnormalities: normal  Conduction Disutrbances:none  Narrative Interpretation:   Old EKG Reviewed: unchanged  Pt comes in with some URI like sx and sob with productive cough and fevers. Suspect viral syndrome as the lung exam was pretty normal. Pt has Wells score of 0 for PE and is PERC  negative. Will get CXR.   Derwood Kaplan, MD 05/31/12 1400  Derwood Kaplan, MD 05/31/12 1434

## 2012-05-31 NOTE — ED Notes (Signed)
MD at bedside. 

## 2012-05-31 NOTE — ED Notes (Signed)
Pt presents w/ cough, headaches, nause w/o emesis, diarrhea and upper airway congestion. Pt has be self treating since Christmas Eve and thought she was improving. Now her cough is worse, headache increasing, productive cough yellowish red. Now is short of breath when speaking and having chest pain.

## 2012-06-26 ENCOUNTER — Other Ambulatory Visit: Payer: Self-pay

## 2012-06-26 MED ORDER — HYDROCODONE-ACETAMINOPHEN 7.5-325 MG PO TABS: 1.0000 | ORAL_TABLET | Freq: Every evening | ORAL | Status: DC | PRN

## 2013-03-13 ENCOUNTER — Emergency Department (HOSPITAL_COMMUNITY)
Admission: EM | Admit: 2013-03-13 | Discharge: 2013-03-14 | Disposition: A | Discharge status: Home / Self Care | Payer: PRIVATE HEALTH INSURANCE | Attending: Emergency Medicine | Admitting: Emergency Medicine

## 2013-03-13 ENCOUNTER — Encounter (HOSPITAL_COMMUNITY): Payer: Self-pay | Admitting: Emergency Medicine

## 2013-03-13 DIAGNOSIS — A499 Bacterial infection, unspecified: Secondary | ICD-10-CM | POA: Insufficient documentation

## 2013-03-13 DIAGNOSIS — K219 Gastro-esophageal reflux disease without esophagitis: Secondary | ICD-10-CM

## 2013-03-13 DIAGNOSIS — Z87891 Personal history of nicotine dependence: Secondary | ICD-10-CM | POA: Insufficient documentation

## 2013-03-13 DIAGNOSIS — Z79899 Other long term (current) drug therapy: Secondary | ICD-10-CM | POA: Insufficient documentation

## 2013-03-13 DIAGNOSIS — Z8679 Personal history of other diseases of the circulatory system: Secondary | ICD-10-CM | POA: Insufficient documentation

## 2013-03-13 DIAGNOSIS — G8929 Other chronic pain: Secondary | ICD-10-CM | POA: Insufficient documentation

## 2013-03-13 DIAGNOSIS — N760 Acute vaginitis: Secondary | ICD-10-CM

## 2013-03-13 DIAGNOSIS — M545 Low back pain, unspecified: Secondary | ICD-10-CM | POA: Insufficient documentation

## 2013-03-13 DIAGNOSIS — R079 Chest pain, unspecified: Secondary | ICD-10-CM | POA: Insufficient documentation

## 2013-03-13 DIAGNOSIS — E669 Obesity, unspecified: Secondary | ICD-10-CM | POA: Insufficient documentation

## 2013-03-13 DIAGNOSIS — J069 Acute upper respiratory infection, unspecified: Secondary | ICD-10-CM

## 2013-03-13 DIAGNOSIS — R110 Nausea: Secondary | ICD-10-CM | POA: Insufficient documentation

## 2013-03-13 DIAGNOSIS — R509 Fever, unspecified: Secondary | ICD-10-CM | POA: Insufficient documentation

## 2013-03-13 DIAGNOSIS — Z880 Allergy status to penicillin: Secondary | ICD-10-CM | POA: Insufficient documentation

## 2013-03-13 DIAGNOSIS — B9689 Other specified bacterial agents as the cause of diseases classified elsewhere: Secondary | ICD-10-CM | POA: Insufficient documentation

## 2013-03-13 MED ORDER — GI COCKTAIL ~~LOC~~
30.0000 mL | Freq: Once | ORAL | Status: AC
  Administered 2013-03-14: 30 mL via ORAL
  Filled 2013-03-13: qty 30

## 2013-03-13 NOTE — ED Notes (Signed)
Pt with multiple complaints, sob, cough, fever, congestion, back pain,

## 2013-03-13 NOTE — ED Provider Notes (Signed)
CSN: [id number]     Arrival date & time 03/13/13  2309 History  This chart was scribed for Shon Baton, MD by Ronal Fear, ED Scribe. This patient was seen in room APA04/APA04 and the patient's care was started at 11:30 PM.    Chief Complaint  Patient presents with  . Shortness of Breath  . Back Pain   HPI  HPI Comments: Anna Cantu is a 47 y.o. female who presents to the Emergency Department complaining sSOB, congestion and productive cough with green sputum onset 1x day ago with no previous occurences.  Pt had a fever of 101 last night. She has a hx of acid reflux and reports worsening of heartburn after eating earlier today. She did take one of her mothers antacids which did help. Pt denies vomiting. No hx of blood clots, she does not use supplemental estrogen. At baseline, patient reports lumbar back pain and a bulging disc. She states at times she has shooting pains in her bilateral lower extremities.   Past Medical History  Diagnosis Date  . Chronic back pain   . Nicotine addiction   . Obesity   . Hemorrhoids   . Gastritis     H/O remote EGD greater than 10 years ago  . Ovarian cyst    Past Surgical History  Procedure Laterality Date  . Right inguinal hernia repair  1982  . Right carpal tunnel release  1991  . Right knee surgery  1993  . Btl and endometrial abaltion    . Cholecystectomy  2007    Dr. Franky Macho   . Left knee surgey- arthroscopy june 2011      Dr. Eulah Pont   . Tubal ligation     No family history on file. History  Substance Use Topics  . Smoking status: Former Smoker -- 0.50 packs/day    Types: Cigarettes  . Smokeless tobacco: Never Used  . Alcohol Use: Yes   OB History   Grav Para Term Preterm Abortions TAB SAB Ect Mult Living                 Review of Systems  Constitutional: Positive for fever.  HENT: Positive for congestion.   Respiratory: Positive for cough and shortness of breath.   Cardiovascular: Positive for chest  pain.  Gastrointestinal: Positive for nausea. Negative for vomiting and abdominal pain.  Genitourinary: Negative for dysuria.  Musculoskeletal: Positive for back pain.  Neurological: Negative for weakness.  All other systems reviewed and are negative.    Allergies  Amoxicillin  Home Medications   Current Outpatient Rx  Name  Route  Sig  Dispense  Refill  . ALPRAZolam (XANAX) 0.5 MG tablet   Oral   Take 0.5 mg by mouth every evening. One tablet at bedtime, as needed for anxity         . diphenhydrAMINE (BENADRYL) 25 MG tablet   Oral   Take 50 mg by mouth daily as needed. For itching         . ibuprofen (ADVIL,MOTRIN) 600 MG tablet   Oral   Take 1 tablet (600 mg total) by mouth every 6 (six) hours as needed for pain.   30 tablet   0   . Multiple Vitamin (MULITIVITAMIN WITH MINERALS) TABS   Oral   Take 1 tablet by mouth daily.         . Ascorbic Acid (VITAMIN C PO)   Oral   Take 1 tablet by mouth daily.         Marland Kitchen  azithromycin (ZITHROMAX) 250 MG tablet   Oral   Take 1 tablet (250 mg total) by mouth daily. Take first 2 tablets together, then 1 every day until finished.   6 tablet   0   . benzonatate (TESSALON) 100 MG capsule   Oral   Take 1 capsule (100 mg total) by mouth every 8 (eight) hours.   21 capsule   0   . cyclobenzaprine (FLEXERIL) 10 MG tablet   Oral   Take 1 tablet (10 mg total) by mouth daily as needed. For muscle spasms   30 tablet   0   . fish oil-omega-3 fatty acids 1000 MG capsule   Oral   Take 1 g by mouth daily.          . furosemide (LASIX) 20 MG tablet   Oral   Take 20 mg by mouth daily. Take one tablet as needed for swelling          . HYDROcodone-acetaminophen (NORCO) 7.5-325 MG per tablet   Oral   Take 1 tablet by mouth at bedtime as needed for pain.   30 tablet   1   . metroNIDAZOLE (FLAGYL) 500 MG tablet   Oral   Take 1 tablet (500 mg total) by mouth 2 (two) times daily.   20 tablet   0   . potassium chloride  SA (K-DUR,KLOR-CON) 20 MEQ tablet   Oral   Take 20 mEq by mouth daily. Take one daily with lasix (furosemide)          . sodium chloride (OCEAN NASAL SPRAY) 0.65 % nasal spray   Nasal   Place 1 spray into the nose as needed for congestion.   30 mL   12   . Zinc 15 MG CAPS   Oral   Take 1 capsule by mouth daily.          BP 105/56  Pulse 60  Temp(Src) 98 F (36.7 C) (Oral)  Resp 20  Ht 5\' 4"  (1.626 m)  Wt 180 lb (81.647 kg)  BMI 30.88 kg/m2  SpO2 100%  LMP 03/06/2013 Physical Exam  Nursing note and vitals reviewed. Constitutional: She is oriented to person, place, and time. She appears well-developed and well-nourished. No distress.  HENT:  Head: Normocephalic and atraumatic.  Mouth/Throat: Oropharynx is clear and moist.  Nasal congestion noted  Eyes: Pupils are equal, round, and reactive to light.  Neck: Neck supple.  Cardiovascular: Normal rate, regular rhythm and normal heart sounds.   Pulmonary/Chest: Effort normal and breath sounds normal. No respiratory distress. She has no wheezes.  Abdominal: Soft. Bowel sounds are normal. There is no tenderness.  Musculoskeletal: She exhibits no edema.  Lymphadenopathy:    She has no cervical adenopathy.  Neurological: She is alert and oriented to person, place, and time.  5 out of 5 strength in all 4 extremity, normal gait  Skin: Skin is warm and dry.  Psychiatric: She has a normal mood and affect.    ED Course  Procedures (including critical care time) DIAGNOSTIC STUDIES: Oxygen Saturation is 100% on RA, normal by my interpretation.    COORDINATION OF CARE:    11:34 PM- Pt advised of plan for treatment chest X-ray and pt agrees.   Labs Review Labs Reviewed  CBC WITH DIFFERENTIAL - Abnormal; Notable for the following:    RBC 3.77 (*)    HCT 35.5 (*)    All other components within normal limits  BASIC METABOLIC PANEL - Abnormal; Notable for  the following:    Potassium 3.3 (*)    Glucose, Bld 106 (*)     GFR calc non Af Amer 86 (*)    All other components within normal limits  URINALYSIS, ROUTINE W REFLEX MICROSCOPIC - Abnormal; Notable for the following:    Hgb urine dipstick SMALL (*)    Leukocytes, UA TRACE (*)    All other components within normal limits  URINE MICROSCOPIC-ADD ON - Abnormal; Notable for the following:    Squamous Epithelial / LPF FEW (*)    Bacteria, UA FEW (*)    All other components within normal limits   Imaging Review Dg Chest 2 View  03/14/2013   CLINICAL DATA:  Shortness of breath and back pain  EXAM: CHEST  2 VIEW  COMPARISON:  06/10/2012  FINDINGS: The heart size and mediastinal contours are within normal limits. Prominent interstitial markings at the bases, stable from prior. No consolidation or edema. The visualized skeletal structures are unremarkable.  IMPRESSION: No active cardiopulmonary disease.   Electronically Signed   By: Tiburcio Pea M.D.   On: 03/14/2013 00:48    EKG Interpretation   None       EKG independently reviewed by myself: Sinus bradycardia with a rate of 53, no evidence of acute ST elevation or ischemia, T wave inversions noted in V1 which are unchanged from prior MDM   1. URI, acute   2. GERD (gastroesophageal reflux disease)   3. Bacterial vaginosis    This 47 year old female who presents with multiple complaints including shortness of breath, nasal congestion, GERD symptoms, and chronic back pain. Physical exam she is nontoxic and afebrile. Neurologic exam is within normal limits.  Patient states concerns about "maybe having a bacterial infection." She endorses that her back pain is chronic. Basic labwork was obtained and notable for mild hypokalemia. X-rays negative for pneumonia. Patient was given a GI cocktail and Toradol for her back pain. She reports improvement of the symptoms. Regarding the patient's URI symptoms, I have recommended supportive care including nasal saline and Tylenol and ibuprofen. Upon reevaluation, now the  patient is complaining of urinary symptoms. Urinalysis was performed. Urinalysis without evidence of urinary tract infection but does show clue cells. Upon further evaluation, patient does endorse vaginal discharge. She will be treated for bacterial vaginosis. Patient will followup with her primary care physician. Patient given strict return precautions.  After history, exam, and medical workup I feel the patient has been appropriately medically screened and is safe for discharge home. Pertinent diagnoses were discussed with the patient. Patient was given return precautions.  I personally performed the services described in this documentation, which was scribed in my presence. The recorded information has been reviewed and is accurate.   Shon Baton, MD 03/14/13 [phone]

## 2013-03-14 ENCOUNTER — Emergency Department (HOSPITAL_COMMUNITY): Payer: PRIVATE HEALTH INSURANCE

## 2013-03-14 LAB — CBC WITH DIFFERENTIAL/PLATELET
Basophils Absolute: 0 10*3/uL (ref 0.0–0.1)
Basophils Relative: 0 % (ref 0–1)
HCT: 35.5 % — ABNORMAL LOW (ref 36.0–46.0)
Lymphocytes Relative: 42 % (ref 12–46)
MCHC: 34.4 g/dL (ref 30.0–36.0)
Monocytes Absolute: 0.5 10*3/uL (ref 0.1–1.0)
Neutro Abs: 4.1 10*3/uL (ref 1.7–7.7)
Neutrophils Relative %: 48 % (ref 43–77)
RDW: 13 % (ref 11.5–15.5)
WBC: 8.5 10*3/uL (ref 4.0–10.5)

## 2013-03-14 LAB — URINALYSIS, ROUTINE W REFLEX MICROSCOPIC
Glucose, UA: NEGATIVE mg/dL
Nitrite: NEGATIVE
Specific Gravity, Urine: 1.02 (ref 1.005–1.030)
pH: 6 (ref 5.0–8.0)

## 2013-03-14 LAB — URINE MICROSCOPIC-ADD ON

## 2013-03-14 LAB — BASIC METABOLIC PANEL
CO2: 26 mEq/L (ref 19–32)
Chloride: 102 mEq/L (ref 96–112)
GFR calc Af Amer: >90 mL/min (ref >90)
Potassium: 3.3 mEq/L — ABNORMAL LOW (ref 3.5–5.1)
Sodium: 138 mEq/L (ref 135–145)

## 2013-03-14 MED ORDER — KETOROLAC TROMETHAMINE 30 MG/ML IJ SOLN
30.0000 mg | Freq: Once | INTRAMUSCULAR | Status: AC
  Administered 2013-03-14: 30 mg via INTRAMUSCULAR
  Filled 2013-03-14: qty 1

## 2013-03-14 MED ORDER — METRONIDAZOLE 500 MG PO TABS: 500.0000 mg | ORAL_TABLET | Freq: Two times a day (BID) | ORAL | Status: DC

## 2013-03-18 ENCOUNTER — Telehealth: Payer: Self-pay | Admitting: Family Medicine

## 2013-03-22 NOTE — Telephone Encounter (Signed)
This will have to be addressed through an ov

## 2013-03-26 ENCOUNTER — Other Ambulatory Visit: Payer: Self-pay | Admitting: Family Medicine

## 2013-03-26 ENCOUNTER — Telehealth: Payer: Self-pay | Admitting: Family Medicine

## 2013-03-26 MED ORDER — BENZONATATE 100 MG PO CAPS: 100.0000 mg | ORAL_CAPSULE | Freq: Three times a day (TID) | ORAL | Status: AC | PRN

## 2013-03-26 MED ORDER — HYDROCODONE-ACETAMINOPHEN 7.5-325 MG PO TABS: ORAL_TABLET | ORAL | Status: DC

## 2013-03-26 MED ORDER — POTASSIUM CHLORIDE CRYS ER 20 MEQ PO TBCR: EXTENDED_RELEASE_TABLET | ORAL | Status: DC

## 2013-03-26 NOTE — Telephone Encounter (Signed)
Message left for patient and meds sent to the pharmacy

## 2013-03-26 NOTE — Telephone Encounter (Signed)
Pls call pt, I have read her letter Let her know potassium was 3.3, 3.5 is normal. Pls fax to pharmacy she states printed script for kcl 67m,eq one daily, needs this I am also printing tessalon perles for you to fax, I will print limited number of vicodin which she can sign and collect next Monday or Tuesday Stated she is off Monday and Tuesday in the letter, works from 1 to 10pm and teel contact info [phone]  states ok to lv a message

## 2013-03-31 ENCOUNTER — Telehealth: Payer: Self-pay | Admitting: Family Medicine

## 2013-03-31 NOTE — Telephone Encounter (Signed)
Spoke with patient and explained to her that rx (hydrocodone) can not be faxed.

## 2013-04-13 ENCOUNTER — Encounter: Payer: Self-pay | Admitting: Family Medicine

## 2013-04-13 ENCOUNTER — Other Ambulatory Visit (HOSPITAL_COMMUNITY)
Admission: RE | Admit: 2013-04-13 | Discharge: 2013-04-13 | Disposition: A | Discharge status: Home / Self Care | Payer: No Typology Code available for payment source | Source: Ambulatory Visit | Attending: Family Medicine | Admitting: Family Medicine

## 2013-04-13 ENCOUNTER — Ambulatory Visit (INDEPENDENT_AMBULATORY_CARE_PROVIDER_SITE_OTHER): Payer: PRIVATE HEALTH INSURANCE | Admitting: Family Medicine

## 2013-04-13 ENCOUNTER — Encounter (INDEPENDENT_AMBULATORY_CARE_PROVIDER_SITE_OTHER): Payer: Self-pay

## 2013-04-13 VITALS — BP 102/60 | HR 83 | Resp 16 | Ht 65.0 in | Wt 193.0 lb

## 2013-04-13 DIAGNOSIS — D219 Benign neoplasm of connective and other soft tissue, unspecified: Secondary | ICD-10-CM

## 2013-04-13 DIAGNOSIS — N760 Acute vaginitis: Secondary | ICD-10-CM | POA: Insufficient documentation

## 2013-04-13 DIAGNOSIS — R1013 Epigastric pain: Secondary | ICD-10-CM

## 2013-04-13 DIAGNOSIS — Z113 Encounter for screening for infections with a predominantly sexual mode of transmission: Secondary | ICD-10-CM | POA: Insufficient documentation

## 2013-04-13 DIAGNOSIS — F17200 Nicotine dependence, unspecified, uncomplicated: Secondary | ICD-10-CM

## 2013-04-13 DIAGNOSIS — K3189 Other diseases of stomach and duodenum: Secondary | ICD-10-CM

## 2013-04-13 DIAGNOSIS — R894 Abnormal immunological findings in specimens from other organs, systems and tissues: Secondary | ICD-10-CM

## 2013-04-13 DIAGNOSIS — Z1239 Encounter for other screening for malignant neoplasm of breast: Secondary | ICD-10-CM

## 2013-04-13 DIAGNOSIS — R1310 Dysphagia, unspecified: Secondary | ICD-10-CM | POA: Insufficient documentation

## 2013-04-13 DIAGNOSIS — R609 Edema, unspecified: Secondary | ICD-10-CM

## 2013-04-13 DIAGNOSIS — N946 Dysmenorrhea, unspecified: Secondary | ICD-10-CM

## 2013-04-13 DIAGNOSIS — M549 Dorsalgia, unspecified: Secondary | ICD-10-CM

## 2013-04-13 DIAGNOSIS — E669 Obesity, unspecified: Secondary | ICD-10-CM

## 2013-04-13 DIAGNOSIS — R768 Other specified abnormal immunological findings in serum: Secondary | ICD-10-CM

## 2013-04-13 DIAGNOSIS — Z23 Encounter for immunization: Secondary | ICD-10-CM

## 2013-04-13 DIAGNOSIS — D259 Leiomyoma of uterus, unspecified: Secondary | ICD-10-CM

## 2013-04-13 DIAGNOSIS — K649 Unspecified hemorrhoids: Secondary | ICD-10-CM

## 2013-04-13 DIAGNOSIS — J309 Allergic rhinitis, unspecified: Secondary | ICD-10-CM

## 2013-04-13 DIAGNOSIS — K5900 Constipation, unspecified: Secondary | ICD-10-CM

## 2013-04-13 DIAGNOSIS — M542 Cervicalgia: Secondary | ICD-10-CM

## 2013-04-13 DIAGNOSIS — J302 Other seasonal allergic rhinitis: Secondary | ICD-10-CM

## 2013-04-13 DIAGNOSIS — R1319 Other dysphagia: Secondary | ICD-10-CM

## 2013-04-13 DIAGNOSIS — Z79899 Other long term (current) drug therapy: Secondary | ICD-10-CM

## 2013-04-13 DIAGNOSIS — N83209 Unspecified ovarian cyst, unspecified side: Secondary | ICD-10-CM

## 2013-04-13 DIAGNOSIS — E559 Vitamin D deficiency, unspecified: Secondary | ICD-10-CM

## 2013-04-13 DIAGNOSIS — R5381 Other malaise: Secondary | ICD-10-CM

## 2013-04-13 LAB — LIPID PANEL
Cholesterol: 183 mg/dL (ref 0–200)
HDL: 64 mg/dL (ref >39)
Total CHOL/HDL Ratio: 2.9 Ratio
Triglycerides: 82 mg/dL (ref <150)
VLDL: 16 mg/dL (ref 0–40)

## 2013-04-13 LAB — BASIC METABOLIC PANEL
Glucose, Bld: 93 mg/dL (ref 70–99)
Potassium: 4.2 mEq/L (ref 3.5–5.3)
Sodium: 138 mEq/L (ref 135–145)

## 2013-04-13 MED ORDER — POTASSIUM CHLORIDE CRYS ER 20 MEQ PO TBCR: EXTENDED_RELEASE_TABLET | ORAL | Status: DC

## 2013-04-13 MED ORDER — IBUPROFEN 800 MG PO TABS: ORAL_TABLET | ORAL | Status: DC

## 2013-04-13 MED ORDER — CYCLOBENZAPRINE HCL 10 MG PO TABS: ORAL_TABLET | ORAL | Status: DC

## 2013-04-13 MED ORDER — FUROSEMIDE 20 MG PO TABS: ORAL_TABLET | ORAL | Status: DC

## 2013-04-13 MED ORDER — FLUTICASONE PROPIONATE 50 MCG/ACT NA SUSP: 1.0000 | Freq: Every day | NASAL | Status: DC

## 2013-04-13 NOTE — Patient Instructions (Addendum)
F/u in 4 month, call if you need me before  Flu vaccine today.  Urine will be sent for testing for GC and Chlamydia  Self collect specimen for testing for yeast, Bv and trichomonas as we will explain.  You are referred to gyne and GI as discussed  Congrats on smoking cessation  It is important that you exercise regularly at least 30 minutes 5 times a week. If you develop chest pain, have severe difficulty breathing, or feel very tired, stop exercising immediately and seek medical attention   A healthy diet is rich in fruit, vegetables and whole grains. Poultry fish, nuts and beans are a healthy choice for protein rather then red meat. A low sodium diet and drinking 64 ounces of water daily is generally recommended. Oils and sweet should be limited. Carbohydrates especially for those who are diabetic or overweight, should be limited to 34-45 gram per meal. It is important to eat on a regular schedule, at least 3 times daily. Snacks should be primarily fruits, vegetables or nuts.  Weight loss goal of 2.5 pounds per month  Labs today chem 7, lipid, TSH, vit D , H pylori , RPR and HIV   I am HOLDING on prevpac at this time, wait on GI eval  Sending in ibuprofen and flexeril for neck pain and spasm  Stop at front desk for mammogram appt  Flonase sent in for allergies

## 2013-04-13 NOTE — Progress Notes (Signed)
  Subjective:    Patient ID: Anna Cantu, female    DOB: [birth year], 47 y.o.   MRN: [id number]  HPI Pt in to re establish care last seen over 1 year ago. C/o chronic back pain worse during menses, requests pain medication for this , does have arthritis in back and  disc disease Has now quit cigarettes , which is wonderful and has also been successful in weight loss through dietary change. C/o pelvic pain, heavy and irregular cycles as well as h/o ovarian cysts , needs GI eval C/o increased and uncontrolled nasal drainage, excessive sneezing and watery eyes with season change, no fever, chills or productive cough, common at this time of the year C/o dyspepsia with dysphagia worsening in past several months needs GI eval Cancer screening and immunization and labs to be updated    Review of Systems    See HPI Denies  Ear pain  Or sore throat Denies chest congestion, productive cough or wheezing. Denies chest pains, palpitations and leg swelling Denies  vomiting,diarrhea or constipation.   Denies dysuria, frequency, hesitancy or incontinence. Denies headaches, seizures, numbness, or tingling. Denies depression, anxiety or insomnia. Denies skin break down or rash.     Objective:   Physical Exam  Patient alert and oriented and in no cardiopulmonary distress.  HEENT: No facial asymmetry, EOMI, no sinus tenderness,  oropharynx pink and moist.  Neck supple no adenopathy.  Chest: Clear to auscultation bilaterally.  CVS: S1, S2 no murmurs, no S3.  ABD: Soft , mild epigastric tenderness, no guarding or rebound ,no organomegaly or mass  Ext: No edema  MS: Adequate though reduced  ROM spine, normal in shoulders, hips and knees.  Skin: Intact, no ulcerations or rash noted.  Psych: Good eye contact, normal affect. Memory intact not anxious or depressed appearing.  CNS: CN 2-12 intact, power, tone and sensation normal throughout.       Assessment & Plan:

## 2013-04-14 DIAGNOSIS — R768 Other specified abnormal immunological findings in serum: Secondary | ICD-10-CM | POA: Insufficient documentation

## 2013-04-16 MED ORDER — CLARITHROMYCIN 500 MG PO TABS: 500.0000 mg | ORAL_TABLET | Freq: Two times a day (BID) | ORAL | Status: AC

## 2013-04-16 MED ORDER — LANSOPRAZOLE 30 MG PO CPDR: 30.0000 mg | DELAYED_RELEASE_CAPSULE | Freq: Two times a day (BID) | ORAL | Status: DC

## 2013-04-16 MED ORDER — METRONIDAZOLE 500 MG PO TABS: 500.0000 mg | ORAL_TABLET | Freq: Two times a day (BID) | ORAL | Status: AC

## 2013-04-19 ENCOUNTER — Ambulatory Visit (HOSPITAL_COMMUNITY): Payer: PRIVATE HEALTH INSURANCE

## 2013-04-20 ENCOUNTER — Ambulatory Visit (HOSPITAL_COMMUNITY)
Admission: RE | Admit: 2013-04-20 | Discharge: 2013-04-20 | Disposition: A | Discharge status: Home / Self Care | Payer: No Typology Code available for payment source | Source: Ambulatory Visit | Attending: Family Medicine | Admitting: Family Medicine

## 2013-04-20 DIAGNOSIS — Z1239 Encounter for other screening for malignant neoplasm of breast: Secondary | ICD-10-CM

## 2013-04-20 DIAGNOSIS — Z1231 Encounter for screening mammogram for malignant neoplasm of breast: Secondary | ICD-10-CM | POA: Insufficient documentation

## 2013-04-21 ENCOUNTER — Encounter: Payer: Self-pay | Admitting: Gastroenterology

## 2013-04-29 ENCOUNTER — Other Ambulatory Visit: Payer: Self-pay

## 2013-04-29 MED ORDER — HYDROCODONE-ACETAMINOPHEN 7.5-325 MG PO TABS: ORAL_TABLET | ORAL | Status: DC

## 2013-05-03 ENCOUNTER — Telehealth: Payer: Self-pay | Admitting: *Deleted

## 2013-05-03 ENCOUNTER — Encounter: Payer: Self-pay | Admitting: Obstetrics & Gynecology

## 2013-05-03 ENCOUNTER — Encounter (INDEPENDENT_AMBULATORY_CARE_PROVIDER_SITE_OTHER): Payer: Self-pay

## 2013-05-03 ENCOUNTER — Ambulatory Visit (INDEPENDENT_AMBULATORY_CARE_PROVIDER_SITE_OTHER): Payer: No Typology Code available for payment source | Admitting: Obstetrics & Gynecology

## 2013-05-03 ENCOUNTER — Telehealth: Payer: Self-pay | Admitting: Family Medicine

## 2013-05-03 VITALS — BP 120/80 | Ht 64.0 in | Wt 192.0 lb

## 2013-05-03 DIAGNOSIS — N920 Excessive and frequent menstruation with regular cycle: Secondary | ICD-10-CM

## 2013-05-03 DIAGNOSIS — N946 Dysmenorrhea, unspecified: Secondary | ICD-10-CM

## 2013-05-03 MED ORDER — MEGESTROL ACETATE 40 MG PO TABS: 40.0000 mg | ORAL_TABLET | Freq: Every day | ORAL | Status: DC

## 2013-05-03 NOTE — Telephone Encounter (Signed)
Pt aware to continue taking Antibiotic.

## 2013-05-03 NOTE — Telephone Encounter (Signed)
Please advise 

## 2013-05-03 NOTE — Progress Notes (Signed)
Patient ID: Anna Cantu, female   DOB: [birth year], 47 y.o.   MRN: [id number] Pt is s/p endometrial ablation about 6-7 years ago Now having periods sometimes a bit heavy 2 times per month Pain can be severe with the bleeding  Exam Normal uterine size no cmt Adnexa negative  Will do sonogram and place on megestrol follow up in next week

## 2013-05-04 NOTE — Telephone Encounter (Signed)
Explain the importance of treating to reduce development of ulcers, and less likely stomach cancer,needs to push through anfd takke meds as prescribed to treat infection once and for all. If having yeasty infection symptoms pls send in fluconazole 150mg  1 daily as needed #3

## 2013-05-10 NOTE — Assessment & Plan Note (Signed)
Uncontrolled , pt to start daily medication for symptom control

## 2013-05-10 NOTE — Assessment & Plan Note (Signed)
Dyspepsia and bloating , positive titer treatment started, still has GI referral

## 2013-05-10 NOTE — Assessment & Plan Note (Signed)
Improved. Pt applauded on succesful weight loss through lifestyle change, and encouraged to continue same. Weight loss goal set for the next several months.  

## 2013-05-10 NOTE — Assessment & Plan Note (Signed)
Self collected specimens sent , will treat based on results

## 2013-05-10 NOTE — Assessment & Plan Note (Signed)
Ongoing  and disabling esp during menses. vicodin on at bedtime . Pt to work on weight loss

## 2013-05-10 NOTE — Assessment & Plan Note (Addendum)
Heavy menses with pain, pt to have gyn eval for furhter management

## 2013-05-11 ENCOUNTER — Ambulatory Visit: Payer: No Typology Code available for payment source | Admitting: Obstetrics & Gynecology

## 2013-05-11 ENCOUNTER — Other Ambulatory Visit: Payer: No Typology Code available for payment source

## 2013-05-12 ENCOUNTER — Other Ambulatory Visit: Payer: No Typology Code available for payment source

## 2013-05-12 ENCOUNTER — Ambulatory Visit (INDEPENDENT_AMBULATORY_CARE_PROVIDER_SITE_OTHER): Payer: No Typology Code available for payment source

## 2013-05-12 ENCOUNTER — Encounter (INDEPENDENT_AMBULATORY_CARE_PROVIDER_SITE_OTHER): Payer: Self-pay

## 2013-05-12 ENCOUNTER — Ambulatory Visit: Payer: No Typology Code available for payment source | Admitting: Obstetrics & Gynecology

## 2013-05-12 ENCOUNTER — Ambulatory Visit (INDEPENDENT_AMBULATORY_CARE_PROVIDER_SITE_OTHER): Payer: No Typology Code available for payment source | Admitting: Obstetrics and Gynecology

## 2013-05-12 VITALS — BP 120/80 | Ht 64.0 in | Wt 193.0 lb

## 2013-05-12 DIAGNOSIS — N898 Other specified noninflammatory disorders of vagina: Secondary | ICD-10-CM

## 2013-05-12 DIAGNOSIS — N946 Dysmenorrhea, unspecified: Secondary | ICD-10-CM

## 2013-05-12 DIAGNOSIS — N920 Excessive and frequent menstruation with regular cycle: Secondary | ICD-10-CM

## 2013-05-12 LAB — POCT WET PREP WITH KOH
Epithelial Wet Prep HPF POC: POSITIVE
Trichomonas, UA: NEGATIVE
Yeast Wet Prep HPF POC: NEGATIVE

## 2013-05-12 MED ORDER — DOXYCYCLINE HYCLATE 100 MG PO CAPS: 100.0000 mg | ORAL_CAPSULE | Freq: Two times a day (BID) | ORAL | Status: DC

## 2013-05-12 NOTE — Progress Notes (Signed)
Subjective:     Patient ID: Anna Cantu, female   DOB: [birth year], 47 y.o.   MRN: [id number]  HPI Vag dischg, irritated at time of menses.  results of u/s   Review of Systems S/p ablation 2007 or earlier,menses starting to return C/o cramps into back. And after sitting has pain from inner thigh down legs and back spasms, also goes down leg + chronic back ache.worsened by menses.     Objective:   Physical Exam     Assessment:    cervicitis/endocervicitis  small endometrial fluid collection     Plan:     Empiric doxycycline 10 day cycle. REcheck 2 wk Pt to try ReFresh personal hygeine

## 2013-05-12 NOTE — Patient Instructions (Signed)
Return for repeat exam in 2-3wk

## 2013-05-17 ENCOUNTER — Ambulatory Visit: Payer: PRIVATE HEALTH INSURANCE | Admitting: Gastroenterology

## 2013-05-24 ENCOUNTER — Telehealth: Payer: Self-pay | Admitting: Family Medicine

## 2013-05-24 NOTE — Telephone Encounter (Signed)
Called and left message for patient to return call.   She has also called with message stating that she is having problems with GERD.

## 2013-05-24 NOTE — Telephone Encounter (Signed)
Needs to be seen by gI nothing else to add, pls let her know to schedule appt , as soon as possible per her schedule, if she needs me to authorize , which I do not think is the case, just have the GI office  Call for this, it will be done, but she needs to make her appt

## 2013-05-24 NOTE — Telephone Encounter (Signed)
Patient stopped ABT for H.Pylori.  Saw Dr. Emelda Fear who put her on another ABT which she completed.  States that she is still having GI problems with stomach feeling "unsettled".  She has not seen GI and states that she will make appointment as soon as possible but has had a change in her work schedule.  She took Prilosec which made problem worse.  Please advise if there are any recommendations.

## 2013-05-24 NOTE — Telephone Encounter (Signed)
Toni Amend spoke with patient this regarding issues already addressed in previous note

## 2013-05-31 ENCOUNTER — Other Ambulatory Visit: Payer: Self-pay

## 2013-05-31 MED ORDER — HYDROCODONE-ACETAMINOPHEN 7.5-325 MG PO TABS: ORAL_TABLET | ORAL | Status: DC

## 2013-06-07 ENCOUNTER — Ambulatory Visit: Payer: No Typology Code available for payment source | Admitting: Obstetrics and Gynecology

## 2013-06-09 ENCOUNTER — Ambulatory Visit: Payer: No Typology Code available for payment source | Admitting: Obstetrics and Gynecology

## 2013-06-14 ENCOUNTER — Encounter (INDEPENDENT_AMBULATORY_CARE_PROVIDER_SITE_OTHER): Payer: Self-pay

## 2013-06-14 ENCOUNTER — Telehealth: Payer: Self-pay

## 2013-06-14 ENCOUNTER — Ambulatory Visit (INDEPENDENT_AMBULATORY_CARE_PROVIDER_SITE_OTHER): Payer: PRIVATE HEALTH INSURANCE

## 2013-06-14 VITALS — BP 120/80 | Wt 192.8 lb

## 2013-06-14 DIAGNOSIS — M549 Dorsalgia, unspecified: Secondary | ICD-10-CM

## 2013-06-14 MED ORDER — KETOROLAC TROMETHAMINE 60 MG/2ML IM SOLN
60.0000 mg | Freq: Once | INTRAMUSCULAR | Status: AC
  Administered 2013-06-14: 60 mg via INTRAMUSCULAR

## 2013-06-14 MED ORDER — METHYLPREDNISOLONE ACETATE 80 MG/ML IJ SUSP
80.0000 mg | Freq: Once | INTRAMUSCULAR | Status: AC
  Administered 2013-06-14: 80 mg via INTRAMUSCULAR

## 2013-06-14 NOTE — Telephone Encounter (Signed)
Patient aware and will come in at 2 for nurse visit.

## 2013-06-14 NOTE — Progress Notes (Signed)
Patient received injections (Toradol 60 and Depo 80) per Dr with no complications

## 2013-06-14 NOTE — Telephone Encounter (Signed)
OK toradol 60mg  Im and depo medrol 80mg  Im , pls document symptom duration and aggravating factor and severity of pai and if radiaiton. She should have ov in the next 2 month, pls ensure this is the case also

## 2013-06-23 ENCOUNTER — Ambulatory Visit (INDEPENDENT_AMBULATORY_CARE_PROVIDER_SITE_OTHER): Payer: No Typology Code available for payment source | Admitting: Obstetrics and Gynecology

## 2013-06-23 VITALS — BP 130/90 | Temp 99.2°F | Ht 64.0 in | Wt 191.0 lb

## 2013-06-23 DIAGNOSIS — N898 Other specified noninflammatory disorders of vagina: Secondary | ICD-10-CM

## 2013-06-23 NOTE — Progress Notes (Signed)
This chart was scribed by Jenne Campus, Medical Scribe, for Dr. Mallory Shirk on 06/23/13 at 2:40 PM. This chart was reviewed by Dr. Mallory Shirk and is accurate.    [facility] Visit  Patient name: Anna Cantu MRN [id number]  Date of birth: April 08, 1966  CC & HPI:  Anna Cantu is a 48 y.o. female presenting today for a recheck of constant vaginal discharge. Results of u/s from December 2014 showed cervicitis/endocervicitis with a small endometrial fluid collection. She was started on Empiric doxycycline 10 day cycle and told to try ReFresh personal hygeine. Also reports severe bilateral breast soreness. Worse with wearing a bra. Using ice and heat with improvement.  ROS:  + vaginal discharge + chronic back ache + bilateral breast soreness + hot flashes No other complaints  Pertinent History Reviewed:  Medical & Surgical Hx:  Reviewed: Significant for ablation in 2007 or earlier, BTL Medications: Reviewed & Updated - see associated section Social History: Reviewed -  reports that she has quit smoking. Her smoking use included Cigarettes. She smoked 0.50 packs per day. She has never used smokeless tobacco.  Objective Findings:  Vitals: BP 130/90  Temp(Src) 99.2 F (37.3 C)  Ht 5\' 4"  (1.626 m)  Wt 191 lb (86.637 kg)  BMI 32.77 kg/m2 Chaperone present. Exam performed with pt's permission. Exam performed with no complications. Physical Examination: General appearance - alert, well appearing, and in no distress and oriented to person, place, and time Mental status - alert, oriented to person, place, and time, normal mood, behavior, speech, dress, motor activity, and thought processes Pelvic - normal external genitalia, vulva, vagina, cervix, uterus and adnexa, VULVA: normal appearing vulva with no masses, tenderness or lesions, VAGINA: normal appearing vagina with normal color and discharge, no lesions, CERVIX: normal appearing cervix without discharge or  lesions, UTERUS: uterus is normal size, shape, consistency and nontender, ADNEXA: normal adnexa in size, nontender and no masses  US showed minimal endometrial stripe and previously noted small fibroids   Assessment & Plan:  Vaginal douching in the evenings once a week for 3 months Use ice and heat for breast soreness. Advised to try different bra types.  Given information on [facility]

## 2013-06-23 NOTE — Patient Instructions (Addendum)
Vaginal douching in the evenings one to two times per month for for 3 months Use ice and heat for breast soreness. Try different bra types for comfort. Review [employer] information.

## 2013-06-24 ENCOUNTER — Encounter: Payer: Self-pay | Admitting: Gastroenterology

## 2013-06-24 ENCOUNTER — Ambulatory Visit (INDEPENDENT_AMBULATORY_CARE_PROVIDER_SITE_OTHER): Payer: PRIVATE HEALTH INSURANCE | Admitting: Gastroenterology

## 2013-06-24 VITALS — BP 132/75 | HR 77 | Temp 98.4°F | Wt 192.2 lb

## 2013-06-24 DIAGNOSIS — K219 Gastro-esophageal reflux disease without esophagitis: Secondary | ICD-10-CM

## 2013-06-24 DIAGNOSIS — K5900 Constipation, unspecified: Secondary | ICD-10-CM

## 2013-06-24 MED ORDER — LINACLOTIDE 145 MCG PO CAPS
145.0000 ug | ORAL_CAPSULE | Freq: Every day | ORAL | Status: DC
Start: 2013-06-24 — End: 2013-10-20

## 2013-06-24 MED ORDER — PANTOPRAZOLE SODIUM 40 MG PO TBEC: 40.0000 mg | DELAYED_RELEASE_TABLET | Freq: Every day | ORAL | Status: DC

## 2013-06-24 MED ORDER — PEG 3350-KCL-NA BICARB-NACL 420 G PO SOLR: 4000.0000 mL | ORAL | Status: DC

## 2013-06-24 MED ORDER — ESOMEPRAZOLE MAGNESIUM 40 MG PO CPDR: 40.0000 mg | DELAYED_RELEASE_CAPSULE | Freq: Every day | ORAL | Status: DC

## 2013-06-24 NOTE — Assessment & Plan Note (Signed)
48 year old female with history of erosive esophagitis on EGD in 2010, now with recurrent GERD symptoms, early satiety, and lack of appetite. H.pylori serology strongly positive in Nov 2014, completed treatment. Vague dysphagia resolved after treatment; no further dysphagia. Persistent dyspepsia and GERD in the setting of Ibuprofen intermittently; however, she is not on a PPI routinely. Needs PPI daily, follow GERD diet strictly, and avoid NSAIDs. Recommend EGD at time of colonoscopy for further assessment of dyspepsia. Again, dysphagia resolved at time of visit.   Proceed with upper endoscopy in the near future with Dr. Gala Romney. The risks, benefits, and alternatives have been discussed in detail with patient. They have stated understanding and desire to proceed.  Start Nexium daily

## 2013-06-24 NOTE — Patient Instructions (Addendum)
  Start taking Nexium 1 capsule each morning, 30 minutes before breakfast. This is for reflux and stomach irritation.  Start taking Linzess 1 capsule each morning, 30 minutes before breakfast. This is to help regulate your bowel movements and treat constipation.  I have provided samples of both and a voucher for Linzess. They were also sent to your pharmacy. You have been scheduled for an updated colonoscopy and upper endoscopy with Dr. Gala Romney in the near future.  Avoid Ibuprofen. Only take tylenol products. Further recommendations to follow!  Diet for Gastroesophageal Reflux Disease, Adult Reflux (acid reflux) is when acid from your stomach flows up into the esophagus. When acid comes in contact with the esophagus, the acid causes irritation and soreness (inflammation) in the esophagus. When reflux happens often or so severely that it causes damage to the esophagus, it is called gastroesophageal reflux disease (GERD). Nutrition therapy can help ease the discomfort of GERD. FOODS OR DRINKS TO AVOID OR LIMIT  Smoking or chewing tobacco. Nicotine is one of the most potent stimulants to acid production in the gastrointestinal tract.  Caffeinated and decaffeinated coffee and black tea.  Regular or low-calorie carbonated beverages or energy drinks (caffeine-free carbonated beverages are allowed).   Strong spices, such as black pepper, white pepper, red pepper, cayenne, curry powder, and chili powder.  Peppermint or spearmint.  Chocolate.  High-fat foods, including meats and fried foods. Extra added fats including oils, butter, salad dressings, and nuts. Limit these to less than 8 tsp per day.  Fruits and vegetables if they are not tolerated, such as citrus fruits or tomatoes.  Alcohol.  Any food that seems to aggravate your condition. If you have questions regarding your diet, call your caregiver or a registered dietitian. OTHER THINGS THAT MAY HELP GERD INCLUDE:   Eating your meals  slowly, in a relaxed setting.  Eating 5 to 6 small meals per day instead of 3 large meals.  Eliminating food for a period of time if it causes distress.  Not lying down until 3 hours after eating a meal.  Keeping the head of your bed raised 6 to 9 inches (15 to 23 cm) by using a foam wedge or blocks under the legs of the bed. Lying flat may make symptoms worse.  Being physically active. Weight loss may be helpful in reducing reflux in overweight or obese adults.  Wear loose fitting clothing EXAMPLE MEAL PLAN This meal plan is approximately 2,000 calories based on [email or web address] meal planning guidelines. Breakfast   cup cooked oatmeal.  1 cup strawberries.  1 cup low-fat milk.  1 oz almonds. Snack  1 cup cucumber slices.  6 oz yogurt (made from low-fat or fat-free milk). Lunch  2 slice whole-wheat bread.  2 oz sliced [place].  2 tsp mayonnaise.  1 cup blueberries.  1 cup snap peas. Snack  6 whole-wheat crackers.  1 oz string cheese. Dinner   cup brown rice.  1 cup mixed veggies.  1 tsp olive oil.  3 oz grilled fish. Document Released: 05/13/2005 Document Revised: 08/05/2011 Document Reviewed: 03/29/2011 [facility] Patient Information 2014 [place], [place].

## 2013-06-24 NOTE — Progress Notes (Signed)
Primary Care Physician:  [name], MD Primary Gastroenterologist:  Dr. Gala Romney  Chief Complaint  Patient presents with  . Gastrophageal Reflux    HPI:   Anna Cantu presents today at the request of Dr. Moshe Cipro secondary to dysphagia, abdominal pain, and constipation. Last EGD in 2010 by Dr. Gala Romney with erosive reflux esophagitis. Colonoscopy at that time with hemorrhoids and rectal polyp, path with polypoid rectal mucosa. Due for surveillance colonoscopy now per notes in medical record.   States she has recurrent symptoms similar to what she had prior to cholecystectomy. Rectum feels numb at times when passing stool. Stool is skinny and in balls at times. Feels constipated. Changed diet, trying to eat high fiber foods to help. Intermittent constipation. Occasional hematochezia. Sometimes has to digitally remove stool and notes low-volume hematochezia at that time. Tries to avoid severe constipation. Will take stool softeners, drink milk, anything to help her "go". Notes lower abdominal discomfort, crampy. Vague. Intermittent. IMPROVED after bowel movement.    Nocturnal reflux noted. Not eating late at night. Drinking Mylanta routinely. No dysphagia.  Feels like a bunch of acid in her throat. H.pylori serology strongly positive. States dysphagia resolved after taking abx for +h.pylori. Motrin intermittently due to lower back pain. Notes lack of appetite. One episode of nausea recently. Early satiety. No melena. NO PPI.   Past Medical History  Diagnosis Date  . Chronic back pain   . Nicotine addiction   . Obesity   . Hemorrhoids   . Gastritis   . Ovarian cyst     Past Surgical History  Procedure Laterality Date  . Right inguinal hernia repair  1982  . Right carpal tunnel release  1991  . Right knee surgery  1993  . Btl and endometrial abaltion    . Cholecystectomy  2007    Dr. Aviva Signs   . Left knee surgey- arthroscopy june 2011      Dr. Percell Miller   .  Tubal ligation    . Esophagogastroduodenoscopy   08/19/2008    Dr. Volney American esophageal erosions, consistent with erosive reflux esophagitis/small HH/The remainder of her upper GI tract appeared normal  . Colonoscopy   08/19/2008    Dr. Madolyn Frieze canal hemorrhoids.  Diminutive rectal polyp  status post cold biopsy and removal.  The remainder of rectal mucosa and colon was normal. Path: polypoid.     Current Outpatient Prescriptions  Medication Sig Dispense Refill  . diphenhydrAMINE (BENADRYL) 25 MG tablet Take 50 mg by mouth daily as needed. For itching      . fluticasone (FLONASE) 50 MCG/ACT nasal spray Place 1 spray into both nostrils daily.  16 g  2  . furosemide (LASIX) 20 MG tablet One tablet once daily, as needed, for fluid retention/swelling  30 tablet  2  . HYDROcodone-acetaminophen (NORCO) 7.5-325 MG per tablet One tablet  at bedtime, as needed, for uncontrolled  pain  30 tablet  0  . ibuprofen (ADVIL,MOTRIN) 800 MG tablet One tablet twice daily for uncontrolled pain. Maximum is 56 tablets per week  30 tablet  2  . megestrol (MEGACE) 40 MG tablet Take 1 tablet (40 mg total) by mouth daily.  30 tablet  3  . Multiple Vitamin (MULITIVITAMIN WITH MINERALS) TABS Take 1 tablet by mouth daily.      . potassium chloride SA (K-DUR,KLOR-CON) 20 MEQ tablet One tablet twice daily, use on days that lasix is taken for swelling  60 tablet  3  . sodium  chloride (OCEAN NASAL SPRAY) 0.65 % nasal spray Place 1 spray into the nose as needed for congestion.  30 mL  12  . Zinc 15 MG CAPS Take 1 capsule by mouth daily.      Marland Kitchen esomeprazole (NEXIUM) 40 MG capsule Take 1 capsule (40 mg total) by mouth daily before breakfast.  30 capsule  5  . Linaclotide (LINZESS) 145 MCG CAPS capsule Take 1 capsule (145 mcg total) by mouth daily. Take 30 minutes prior to breakfast  30 capsule  5  . polyethylene glycol-electrolytes (TRILYTE) 420 G solution Take 4,000 mLs by mouth as directed.  4000 mL  0   No current  facility-administered medications for this visit.    Allergies as of 06/24/2013 - Review Complete 06/24/2013  Allergen Reaction Noted  . Amoxicillin Itching and Rash 09/16/2011    Family History  Problem Relation Age of Onset  . Hypertension Mother   . Stroke Mother   . Hypertension Father   . Colon cancer Neg Hx     History   Social History  . Marital Status: Divorced    Spouse Name: N/A    Number of Children: N/A  . Years of Education: N/A   Occupational History  . Not on file.   Social History Main Topics  . Smoking status: Former Smoker -- 0.50 packs/day    Types: Cigarettes  . Smokeless tobacco: Never Used     Comment: quit Mar 03, 2013   . Alcohol Use: Yes     Comment: occasionally  . Drug Use: No  . Sexual Activity: Yes    Birth Control/ Protection: Surgical   Other Topics Concern  . Not on file   Social History Narrative  . No narrative on file    Review of Systems: Gen: see HPI CV: Denies chest pain, heart palpitations, peripheral edema, syncope.  Resp: Denies shortness of breath at rest or with exertion. Denies wheezing or cough.  GI: see HPI GU : urinary frequency, doing "detox" now MS: back pain Derm: Denies rash, itching, dry skin Psych: Denies depression, anxiety, memory loss, and confusion Heme: Denies bruising, bleeding, and enlarged lymph nodes.  Physical Exam: BP 132/75  Pulse 77  Temp(Src) 98.4 F (36.9 C) (Oral)  Wt 192 lb 3.2 oz (87.181 kg) General:   Alert and oriented. Pleasant and cooperative. Well-nourished and well-developed.  Head:  Normocephalic and atraumatic. Eyes:  Without icterus, sclera clear and conjunctiva pink.  Ears:  Normal auditory acuity. Nose:  No deformity, discharge,  or lesions. Mouth:  No deformity or lesions, oral mucosa pink.  Neck:  Supple, without mass or thyromegaly. Lungs:  Clear to auscultation bilaterally. No wheezes, rales, or rhonchi. No distress.  Heart:  S1, S2 present without murmurs  appreciated.  Abdomen:  +BS, soft, mild epigastric TTPand non-distended. No HSM noted. No guarding or rebound. No masses appreciated.  Rectal:  Deferred  Msk:  Symmetrical without gross deformities. Normal posture. Extremities:  Without clubbing or edema. Neurologic:  Alert and  oriented x4;  grossly normal neurologically. Skin:  Intact without significant lesions or rashes. Cervical Nodes:  No significant cervical adenopathy. Psych:  Alert and cooperative. Normal mood and affect.   Nov 2014 H.pylori serology: 4.99. Treated with Biaxin, Flagyl, Prevacid.

## 2013-06-24 NOTE — Progress Notes (Signed)
cc'd to pcp 

## 2013-06-24 NOTE — Assessment & Plan Note (Signed)
With low-volume hematochezia likely benign source. Last colonoscopy in 2010 with benign polyp; due for surveillance/routine colonoscopy now per notes in medical record.   Proceed with TCS with Dr. Gala Romney in near future: the risks, benefits, and alternatives have been discussed with the patient in detail. The patient states understanding and desires to proceed. Linzess 145 mcg voucher and prescription provided.

## 2013-06-28 ENCOUNTER — Other Ambulatory Visit: Payer: Self-pay

## 2013-06-28 MED ORDER — HYDROCODONE-ACETAMINOPHEN 7.5-325 MG PO TABS: ORAL_TABLET | ORAL | Status: DC

## 2013-07-05 ENCOUNTER — Encounter (HOSPITAL_COMMUNITY): Payer: Self-pay | Admitting: Pharmacy Technician

## 2013-07-08 ENCOUNTER — Ambulatory Visit (HOSPITAL_COMMUNITY)
Admission: RE | Admit: 2013-07-08 | Discharge: 2013-07-08 | Disposition: A | Discharge status: Home / Self Care | Payer: No Typology Code available for payment source | Source: Ambulatory Visit | Attending: Internal Medicine | Admitting: Internal Medicine

## 2013-07-08 ENCOUNTER — Encounter (HOSPITAL_COMMUNITY): Payer: Self-pay | Admitting: *Deleted

## 2013-07-08 ENCOUNTER — Encounter (HOSPITAL_COMMUNITY)
Admission: RE | Disposition: A | Discharge status: Home / Self Care | Payer: Self-pay | Source: Ambulatory Visit | Attending: Internal Medicine

## 2013-07-08 DIAGNOSIS — K5730 Diverticulosis of large intestine without perforation or abscess without bleeding: Secondary | ICD-10-CM | POA: Insufficient documentation

## 2013-07-08 DIAGNOSIS — D126 Benign neoplasm of colon, unspecified: Secondary | ICD-10-CM | POA: Insufficient documentation

## 2013-07-08 DIAGNOSIS — K648 Other hemorrhoids: Secondary | ICD-10-CM | POA: Insufficient documentation

## 2013-07-08 DIAGNOSIS — D129 Benign neoplasm of anus and anal canal: Secondary | ICD-10-CM

## 2013-07-08 DIAGNOSIS — K219 Gastro-esophageal reflux disease without esophagitis: Secondary | ICD-10-CM

## 2013-07-08 DIAGNOSIS — K921 Melena: Secondary | ICD-10-CM | POA: Insufficient documentation

## 2013-07-08 DIAGNOSIS — K5900 Constipation, unspecified: Secondary | ICD-10-CM

## 2013-07-08 DIAGNOSIS — K2990 Gastroduodenitis, unspecified, without bleeding: Principal | ICD-10-CM

## 2013-07-08 DIAGNOSIS — K3189 Other diseases of stomach and duodenum: Secondary | ICD-10-CM | POA: Insufficient documentation

## 2013-07-08 DIAGNOSIS — Z87891 Personal history of nicotine dependence: Secondary | ICD-10-CM | POA: Insufficient documentation

## 2013-07-08 DIAGNOSIS — D128 Benign neoplasm of rectum: Secondary | ICD-10-CM

## 2013-07-08 DIAGNOSIS — Z860100 Personal history of colon polyps, unspecified: Secondary | ICD-10-CM | POA: Insufficient documentation

## 2013-07-08 DIAGNOSIS — Q2733 Arteriovenous malformation of digestive system vessel: Secondary | ICD-10-CM | POA: Insufficient documentation

## 2013-07-08 DIAGNOSIS — K449 Diaphragmatic hernia without obstruction or gangrene: Secondary | ICD-10-CM | POA: Insufficient documentation

## 2013-07-08 DIAGNOSIS — A048 Other specified bacterial intestinal infections: Secondary | ICD-10-CM | POA: Insufficient documentation

## 2013-07-08 DIAGNOSIS — K625 Hemorrhage of anus and rectum: Secondary | ICD-10-CM

## 2013-07-08 DIAGNOSIS — K2970 Gastritis, unspecified, without bleeding: Secondary | ICD-10-CM | POA: Insufficient documentation

## 2013-07-08 DIAGNOSIS — R1013 Epigastric pain: Secondary | ICD-10-CM

## 2013-07-08 HISTORY — PX: COLONOSCOPY WITH ESOPHAGOGASTRODUODENOSCOPY (EGD): SHX5779

## 2013-07-08 SURGERY — COLONOSCOPY WITH ESOPHAGOGASTRODUODENOSCOPY (EGD)
Anesthesia: Moderate Sedation

## 2013-07-08 MED ORDER — SODIUM CHLORIDE 0.9 % IV SOLN
INTRAVENOUS | Status: DC
Start: 2013-07-08 — End: 2013-07-08
  Administered 2013-07-08: 12:00:00 via INTRAVENOUS

## 2013-07-08 MED ORDER — ONDANSETRON HCL 4 MG/2ML IJ SOLN
INTRAMUSCULAR | Status: DC | PRN
  Administered 2013-07-08: 4 mg via INTRAVENOUS

## 2013-07-08 MED ORDER — LIDOCAINE VISCOUS 2 % MT SOLN
OROMUCOSAL | Status: DC | PRN
  Administered 2013-07-08: 3 mL via OROMUCOSAL

## 2013-07-08 MED ORDER — MIDAZOLAM HCL 5 MG/5ML IJ SOLN
INTRAMUSCULAR | Status: DC | PRN
Start: 2013-07-08 — End: 2013-07-08
  Administered 2013-07-08: 2 mg via INTRAVENOUS
  Administered 2013-07-08 (×2): 1 mg via INTRAVENOUS
  Administered 2013-07-08: 2 mg via INTRAVENOUS
  Administered 2013-07-08: 1 mg via INTRAVENOUS

## 2013-07-08 MED ORDER — MIDAZOLAM HCL 5 MG/5ML IJ SOLN
INTRAMUSCULAR | Status: AC
  Filled 2013-07-08: qty 10

## 2013-07-08 MED ORDER — MEPERIDINE HCL 100 MG/ML IJ SOLN
INTRAMUSCULAR | Status: AC
  Filled 2013-07-08: qty 2

## 2013-07-08 MED ORDER — ONDANSETRON HCL 4 MG/2ML IJ SOLN
INTRAMUSCULAR | Status: AC
  Filled 2013-07-08: qty 2

## 2013-07-08 MED ORDER — STERILE WATER FOR IRRIGATION IR SOLN
Status: DC | PRN
  Administered 2013-07-08: 13:00:00

## 2013-07-08 MED ORDER — LIDOCAINE VISCOUS 2 % MT SOLN
OROMUCOSAL | Status: AC
  Filled 2013-07-08: qty 15

## 2013-07-08 MED ORDER — MEPERIDINE HCL 100 MG/ML IJ SOLN
INTRAMUSCULAR | Status: DC | PRN
  Administered 2013-07-08: 50 mg via INTRAVENOUS
  Administered 2013-07-08 (×3): 25 mg via INTRAVENOUS

## 2013-07-08 NOTE — Interval H&P Note (Signed)
History and Physical Interval Note:  07/08/2013 1:00 PM  KAYTLYNNE NEACE  has presented today for surgery, with the diagnosis of CONSTIPATION AND GERD  The various methods of treatment have been discussed with the patient and family. After consideration of risks, benefits and other options for treatment, the patient has consented to  Procedure(s) with comments: COLONOSCOPY WITH ESOPHAGOGASTRODUODENOSCOPY (EGD) (N/A) - 1:00 as a surgical intervention .  The patient's history has been reviewed, patient examined, no change in status, stable for surgery.  I have reviewed the patient's chart and labs.  Questions were answered to the patient's satisfaction.    No change. EGD and colonoscopy per plan.The risks, benefits, limitations, imponderables and alternatives regarding both EGD and colonoscopy have been reviewed with the patient. Questions have been answered. All parties agreeable.   Manus Rudd

## 2013-07-08 NOTE — H&P (View-Only) (Signed)
    Primary Care Physician:  Margaret Simpson, MD Primary Gastroenterologist:  Dr. Rourk  Chief Complaint  Patient presents with  . Gastrophageal Reflux    HPI:   Anna Cantu presents today at the request of Dr. Simpson secondary to dysphagia, abdominal pain, and constipation. Last EGD in 2010 by Dr. Rourk with erosive reflux esophagitis. Colonoscopy at that time with hemorrhoids and rectal polyp, path with polypoid rectal mucosa. Due for surveillance colonoscopy now per notes in medical record.   States she has recurrent symptoms similar to what she had prior to cholecystectomy. Rectum feels numb at times when passing stool. Stool is skinny and in balls at times. Feels constipated. Changed diet, trying to eat high fiber foods to help. Intermittent constipation. Occasional hematochezia. Sometimes has to digitally remove stool and notes low-volume hematochezia at that time. Tries to avoid severe constipation. Will take stool softeners, drink milk, anything to help her "go". Notes lower abdominal discomfort, crampy. Vague. Intermittent. IMPROVED after bowel movement.    Nocturnal reflux noted. Not eating late at night. Drinking Mylanta routinely. No dysphagia.  Feels like a bunch of acid in her throat. H.pylori serology strongly positive. States dysphagia resolved after taking abx for +h.pylori. Motrin intermittently due to lower back pain. Notes lack of appetite. One episode of nausea recently. Early satiety. No melena. NO PPI.   Past Medical History  Diagnosis Date  . Chronic back pain   . Nicotine addiction   . Obesity   . Hemorrhoids   . Gastritis   . Ovarian cyst     Past Surgical History  Procedure Laterality Date  . Right inguinal hernia repair  1982  . Right carpal tunnel release  1991  . Right knee surgery  1993  . Btl and endometrial abaltion    . Cholecystectomy  2007    Dr. Mark Jenkins   . Left knee surgey- arthroscopy june 2011      Dr. Murphy   .  Tubal ligation    . Esophagogastroduodenoscopy   08/19/2008    Dr. Rourk:Distal esophageal erosions, consistent with erosive reflux esophagitis/small HH/The remainder of her upper GI tract appeared normal  . Colonoscopy   08/19/2008    Dr. Rourk:Anal canal hemorrhoids.  Diminutive rectal polyp  status post cold biopsy and removal.  The remainder of rectal mucosa and colon was normal. Path: polypoid.     Current Outpatient Prescriptions  Medication Sig Dispense Refill  . diphenhydrAMINE (BENADRYL) 25 MG tablet Take 50 mg by mouth daily as needed. For itching      . fluticasone (FLONASE) 50 MCG/ACT nasal spray Place 1 spray into both nostrils daily.  16 g  2  . furosemide (LASIX) 20 MG tablet One tablet once daily, as needed, for fluid retention/swelling  30 tablet  2  . HYDROcodone-acetaminophen (NORCO) 7.5-325 MG per tablet One tablet  at bedtime, as needed, for uncontrolled  pain  30 tablet  0  . ibuprofen (ADVIL,MOTRIN) 800 MG tablet One tablet twice daily for uncontrolled pain. Maximum is 56 tablets per week  30 tablet  2  . megestrol (MEGACE) 40 MG tablet Take 1 tablet (40 mg total) by mouth daily.  30 tablet  3  . Multiple Vitamin (MULITIVITAMIN WITH MINERALS) TABS Take 1 tablet by mouth daily.      . potassium chloride SA (K-DUR,KLOR-CON) 20 MEQ tablet One tablet twice daily, use on days that lasix is taken for swelling  60 tablet  3  . sodium   chloride (OCEAN NASAL SPRAY) 0.65 % nasal spray Place 1 spray into the nose as needed for congestion.  30 mL  12  . Zinc 15 MG CAPS Take 1 capsule by mouth daily.      Marland Kitchen esomeprazole (NEXIUM) 40 MG capsule Take 1 capsule (40 mg total) by mouth daily before breakfast.  30 capsule  5  . Linaclotide (LINZESS) 145 MCG CAPS capsule Take 1 capsule (145 mcg total) by mouth daily. Take 30 minutes prior to breakfast  30 capsule  5  . polyethylene glycol-electrolytes (TRILYTE) 420 G solution Take 4,000 mLs by mouth as directed.  4000 mL  0   No current  facility-administered medications for this visit.    Allergies as of 06/24/2013 - Review Complete 06/24/2013  Allergen Reaction Noted  . Amoxicillin Itching and Rash 09/16/2011    Family History  Problem Relation Age of Onset  . Hypertension Mother   . Stroke Mother   . Hypertension Father   . Colon cancer Neg Hx     History   Social History  . Marital Status: Divorced    Spouse Name: N/A    Number of Children: N/A  . Years of Education: N/A   Occupational History  . Not on file.   Social History Main Topics  . Smoking status: Former Smoker -- 0.50 packs/day    Types: Cigarettes  . Smokeless tobacco: Never Used     Comment: quit Mar 03, 2013   . Alcohol Use: Yes     Comment: occasionally  . Drug Use: No  . Sexual Activity: Yes    Birth Control/ Protection: Surgical   Other Topics Concern  . Not on file   Social History Narrative  . No narrative on file    Review of Systems: Gen: see HPI CV: Denies chest pain, heart palpitations, peripheral edema, syncope.  Resp: Denies shortness of breath at rest or with exertion. Denies wheezing or cough.  GI: see HPI GU : urinary frequency, doing "detox" now MS: back pain Derm: Denies rash, itching, dry skin Psych: Denies depression, anxiety, memory loss, and confusion Heme: Denies bruising, bleeding, and enlarged lymph nodes.  Physical Exam: BP 132/75  Pulse 77  Temp(Src) 98.4 F (36.9 C) (Oral)  Wt 192 lb 3.2 oz (87.181 kg) General:   Alert and oriented. Pleasant and cooperative. Well-nourished and well-developed.  Head:  Normocephalic and atraumatic. Eyes:  Without icterus, sclera clear and conjunctiva pink.  Ears:  Normal auditory acuity. Nose:  No deformity, discharge,  or lesions. Mouth:  No deformity or lesions, oral mucosa pink.  Neck:  Supple, without mass or thyromegaly. Lungs:  Clear to auscultation bilaterally. No wheezes, rales, or rhonchi. No distress.  Heart:  S1, S2 present without murmurs  appreciated.  Abdomen:  +BS, soft, mild epigastric TTPand non-distended. No HSM noted. No guarding or rebound. No masses appreciated.  Rectal:  Deferred  Msk:  Symmetrical without gross deformities. Normal posture. Extremities:  Without clubbing or edema. Neurologic:  Alert and  oriented x4;  grossly normal neurologically. Skin:  Intact without significant lesions or rashes. Cervical Nodes:  No significant cervical adenopathy. Psych:  Alert and cooperative. Normal mood and affect.   Nov 2014 H.pylori serology: 4.99. Treated with Biaxin, Flagyl, Prevacid.

## 2013-07-08 NOTE — Discharge Instructions (Addendum)
Colonoscopy Discharge Instructions  Read the instructions outlined below and refer to this sheet in the next few weeks. These discharge instructions provide you with general information on caring for yourself after you leave the hospital. Your doctor may also give you specific instructions. While your treatment has been planned according to the most current medical practices available, unavoidable complications occasionally occur. If you have any problems or questions after discharge, call Dr. Gala Romney at [phone]. ACTIVITY  You may resume your regular activity, but move at a slower pace for the next 24 hours.   Take frequent rest periods for the next 24 hours.   Walking will help get rid of the air and reduce the bloated feeling in your belly (abdomen).   No driving for 24 hours (because of the medicine (anesthesia) used during the test).    Do not sign any important legal documents or operate any machinery for 24 hours (because of the anesthesia used during the test).  NUTRITION  Drink plenty of fluids.   You may resume your normal diet as instructed by your doctor.   Begin with a light meal and progress to your normal diet. Heavy or fried foods are harder to digest and may make you feel sick to your stomach (nauseated).   Avoid alcoholic beverages for 24 hours or as instructed.  MEDICATIONS  You may resume your normal medications unless your doctor tells you otherwise.  WHAT YOU CAN EXPECT TODAY  Some feelings of bloating in the abdomen.   Passage of more gas than usual.   Spotting of blood in your stool or on the toilet paper.  IF YOU HAD POLYPS REMOVED DURING THE COLONOSCOPY:  No aspirin products for 7 days or as instructed.   No alcohol for 7 days or as instructed.   Eat a soft diet for the next 24 hours.  FINDING OUT THE RESULTS OF YOUR TEST Not all test results are available during your visit. If your test results are not back during the visit, make an appointment  with your caregiver to find out the results. Do not assume everything is normal if you have not heard from your caregiver or the medical facility. It is important for you to follow up on all of your test results.  SEEK IMMEDIATE MEDICAL ATTENTION IF:  You have more than a spotting of blood in your stool.   Your belly is swollen (abdominal distention).   You are nauseated or vomiting.   You have a temperature over 101.  You have abdominal pain or discomfort that is severe or gets worse throughout the day  Colon Polyps Polyps are lumps of extra tissue growing inside the body. Polyps can grow in the large intestine (colon). Most colon polyps are noncancerous (benign). However, some colon polyps can become cancerous over time. Polyps that are larger than a pea may be harmful. To be safe, caregivers remove and test all polyps. CAUSES  Polyps form when mutations in the genes cause your cells to grow and divide even though no more tissue is needed. RISK FACTORS There are a number of risk factors that can increase your chances of getting colon polyps. They include:  Being older than 50 years.  Family history of colon polyps or colon cancer.  Long-term colon diseases, such as colitis or Crohn disease.  Being overweight.  Smoking.  Being inactive.  Drinking too much alcohol. SYMPTOMS  Most small polyps do not cause symptoms. If symptoms are present, they may include:  Blood  in the stool. The stool may look dark red or black.  Constipation or diarrhea that lasts longer than 1 week. DIAGNOSIS People often do not know they have polyps until their caregiver finds them during a regular checkup. Your caregiver can use 4 tests to check for polyps:  Digital rectal exam. The caregiver wears gloves and feels inside the rectum. This test would find polyps only in the rectum.  Barium enema. The caregiver puts a liquid called barium into your rectum before taking X-rays of your colon. Barium  makes your colon look white. Polyps are dark, so they are easy to see in the X-ray pictures.  Sigmoidoscopy. A thin, flexible tube (sigmoidoscope) is placed into your rectum. The sigmoidoscope has a light and tiny camera in it. The caregiver uses the sigmoidoscope to look at the last third of your colon.  Colonoscopy. This test is like sigmoidoscopy, but the caregiver looks at the entire colon. This is the most common method for finding and removing polyps. TREATMENT  Any polyps will be removed during a sigmoidoscopy or colonoscopy. The polyps are then tested for cancer. PREVENTION  To help lower your risk of getting more colon polyps:  Eat plenty of fruits and vegetables. Avoid eating fatty foods.  Do not smoke.  Avoid drinking alcohol.  Exercise every day.  Lose weight if recommended by your caregiver.  Eat plenty of calcium and folate. Foods that are rich in calcium include milk, cheese, and broccoli. Foods that are rich in folate include chickpeas, kidney beans, and spinach. HOME CARE INSTRUCTIONS Keep all follow-up appointments as directed by your caregiver. You may need periodic exams to check for polyps. SEEK MEDICAL CARE IF: You notice bleeding during a bowel movement. Document Released: 02/07/2004 Document Revised: 08/05/2011 Document Reviewed: 07/23/2011 [facility] Patient Information 2014 [place]. Marland Kitchen EGD Discharge instructions Please read the instructions outlined below and refer to this sheet in the next few weeks. These discharge instructions provide you with general information on caring for yourself after you leave the hospital. Your doctor may also give you specific instructions. While your treatment has been planned according to the most current medical practices available, unavoidable complications occasionally occur. If you have any problems or questions after discharge, please call your doctor. ACTIVITY You may resume your regular activity but move at a slower  pace for the next 24 hours.  Take frequent rest periods for the next 24 hours.  Walking will help expel (get rid of) the air and reduce the bloated feeling in your abdomen.  No driving for 24 hours (because of the anesthesia (medicine) used during the test).  You may shower.  Do not sign any important legal documents or operate any machinery for 24 hours (because of the anesthesia used during the test).  NUTRITION Drink plenty of fluids.  You may resume your normal diet.  Begin with a light meal and progress to your normal diet.  Avoid alcoholic beverages for 24 hours or as instructed by your caregiver.  MEDICATIONS You may resume your normal medications unless your caregiver tells you otherwise.  WHAT YOU CAN EXPECT TODAY You may experience abdominal discomfort such as a feeling of fullness or gas pains.  FOLLOW-UP Your doctor will discuss the results of your test with you.  SEEK IMMEDIATE MEDICAL ATTENTION IF ANY OF THE FOLLOWING OCCUR: Excessive nausea (feeling sick to your stomach) and/or vomiting.  Severe abdominal pain and distention (swelling).  Trouble swallowing.  Temperature over 101 F (37.8 C).  Rectal  bleeding or vomiting of blood.    Stop Nexium; begin Dexilant 60 mg daily-go by my office for free samples  Polyp and constipation information provided  Take Linzess 145 every day for constipation  Schedule appointment in the office for hemorrhoid banding in the near future. Further recommendations to follow pending review of pathology report

## 2013-07-08 NOTE — Op Note (Signed)
[facility] [address] [place], [address]   COLONOSCOPY PROCEDURE REPORT  PATIENT: Anna Cantu, Anna Cantu  MR#:         [id number] BIRTHDATE: 1966-05-25 , 48  yrs. old GENDER: Female ENDOSCOPIST: R.  Garfield Cornea, MD FACP FACG REFERRED BY:  [name], M.D. PROCEDURE DATE:  07/08/2013 PROCEDURE:     Colonoscopy with biopsy and snare polypectomy  INDICATIONS: Hematochezia; constipation; history of polyp; Only sporadic use of Linzess  INFORMED CONSENT:  The risks, benefits, alternatives and imponderables including but not limited to bleeding, perforation as well as the possibility of a missed lesion have been reviewed.  The potential for biopsy, lesion removal, etc. have also been discussed.  Questions have been answered.  All parties agreeable. Please see the history and physical in the medical record for more information.  MEDICATIONS: Versed 7 mg IV and Demerol 125 mg IV in divided doses. Zofran 4 mg IV.  DESCRIPTION OF PROCEDURE:  After a digital rectal exam was performed, the EG-[id number] ([id number]) and EC-[id number] ([id number]) colonoscope was advanced from the anus through the rectum and colon to the area of the cecum, ileocecal valve and appendiceal orifice. The cecum was deeply intubated.  These structures were well-seen and photographed for the record.  From the level of the cecum and ileocecal valve, the scope was slowly and cautiously withdrawn. The mucosal surfaces were carefully surveyed utilizing scope tip deflection to facilitate fold flattening as needed.  The scope was pulled down into the rectum where a thorough examination including retroflexion was performed.    FINDINGS:  Adequate preparation.  (1) 5 mm polyp in the rectum at 5 cm; otherwise, aside from prominent internal hemorrhoids, the remainder of the rectal mucosa appeared normal. The patient had diffuse left-sided diverticula. There were (2) diminutive polyps in the descending  segment; otherwise, the remainder of the colonic mucosa appeared normal.  THERAPEUTIC / DIAGNOSTIC MANEUVERS PERFORMED:  The rectal polyp was cold snare removed; the diminutive descending colon polyps were cold biopsied removed. . COMPLICATIONS: None  CECAL WITHDRAWAL TIME:  10 minutes  IMPRESSION:  Rectal and colonic polyps-removed as described above. Colonic diverticulosis.  Hemorrhoids-likely source of hematochezia.  RECOMMENDATIONS:   Followup on pathology. See EGD report  Importance of taking Linzess (145)  daily emphasized. Arrange office appointment for hemorrhoidal banding in the near future.   _______________________________ eSigned:  R. Garfield Cornea, MD FACP [facility] 07/08/2013 1:47 PM   CC:    PATIENT NAME:  Tashiya, Souders MR#: [id number]

## 2013-07-08 NOTE — Op Note (Signed)
[facility] [address] [place], [address]   ENDOSCOPY PROCEDURE REPORT  PATIENT: Anna Cantu, Anna Cantu  MR#: [id number] BIRTHDATE: 1965/07/28 , 48  yrs. old GENDER: Female ENDOSCOPIST: R.  Garfield Cornea, MD FACP FACG REFERRED BY:  [name], M.D. PROCEDURE DATE:  07/08/2013 PROCEDURE:     EGD with gastric biopsy  INDICATIONS:     Persistent dyspepsia and GERD; recently treated for H. pylori infection based on serologies  INFORMED CONSENT:   The risks, benefits, limitations, alternatives and imponderables have been discussed.  The potential for biopsy, esophogeal dilation, etc. have also been reviewed.  Questions have been answered.  All parties agreeable.  Please see the history and physical in the medical record for more information.  MEDICATIONS:      Versed 6 mg IV and Demerol 100 mg IV in divided doses. Zofran 4 mg IV.  Xylocaine gel 2% orally  DESCRIPTION OF PROCEDURE:   The [id number] ([id number])  endoscope was introduced through the mouth and advanced to the second portion of the duodenum without difficulty or limitations.  The mucosal surfaces were surveyed very carefully during advancement of the scope and upon withdrawal.  Retroflexion view of the proximal stomach and esophagogastric junction was performed.      FINDINGS:    Normal-appearing, patent tubular esophagus. Stomach empty. Patient had a couple of antral erosions and a small hiatal hernia; otherwise the gastric cavity appeared normal. Patent pylorus. 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 The remainder of the duodenal mucosa appeared normal through the second portion.  THERAPEUTIC / DIAGNOSTIC MANEUVERS PERFORMED:  Biopsies abnormal antral mucosa taken for histologic study   COMPLICATIONS:  None  IMPRESSION:   Small hiatal hernia. Antral erosions: Nonbleeding duodenal AVM; otherwise negative  EGD-status post gastric biopsy.  RECOMMENDATIONS:   Stop Nexium;   Begin Dexilant 60 mg daily. Followup on pathology. See colonoscopy report.    _______________________________ R. Garfield Cornea, MD FACP [facility] eSigned:  R. Garfield Cornea, MD FACP [facility] 07/08/2013 1:25 PM     CC:

## 2013-07-13 ENCOUNTER — Encounter: Payer: Self-pay | Admitting: Internal Medicine

## 2013-07-13 ENCOUNTER — Encounter (HOSPITAL_COMMUNITY): Payer: Self-pay | Admitting: Internal Medicine

## 2013-07-26 ENCOUNTER — Telehealth: Payer: Self-pay | Admitting: *Deleted

## 2013-07-26 NOTE — Telephone Encounter (Signed)
Spoke with pt

## 2013-07-26 NOTE — Telephone Encounter (Signed)
Pt called for her results, pt states she is at work if she don't answer. Please advise

## 2013-08-02 ENCOUNTER — Telehealth: Payer: Self-pay

## 2013-08-02 NOTE — Telephone Encounter (Signed)
Where is abdominal pain?  Did we ever send in a prescription for her? I believe she will need Pylera, as she was treated with Prevpac equivalent in the past.

## 2013-08-02 NOTE — Telephone Encounter (Signed)
Pt called and said she was recently diagnosed with H. Pylori. She did not get medication because she had thought in the past that meds for H Pylori caused abdominal pain.  She said she is having abdominal pain now and wants to know what she is supposed to do. Please advise!

## 2013-08-03 ENCOUNTER — Other Ambulatory Visit: Payer: Self-pay | Admitting: Internal Medicine

## 2013-08-03 ENCOUNTER — Telehealth: Payer: Self-pay

## 2013-08-03 DIAGNOSIS — Q400 Congenital hypertrophic pyloric stenosis: Secondary | ICD-10-CM

## 2013-08-03 MED ORDER — ONDANSETRON HCL 4 MG PO TABS: 4.0000 mg | ORAL_TABLET | Freq: Three times a day (TID) | ORAL | Status: DC | PRN

## 2013-08-03 NOTE — Telephone Encounter (Signed)
I sent in Zofran

## 2013-08-03 NOTE — Telephone Encounter (Signed)
Message copied by Claudina Lick on Tue Aug 03, 2013  2:19 PM ------      Message from: Daneil Dolin      Created: Tue Jul 27, 2013 12:06 PM       This is now a difficult situation because she did not take the medication originally as prescribed. 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 She is ampicillin allergic. She needs to be referred to the ID clinic to help on eradicate in the infection.  he      ----- Message -----         From: Marylou Mccoy, LPN         Sent: 08/01/1694  11:23 AM           To: Daneil Dolin, MD            Dr. Gala Romney, did you get the note on this pt that I sent you yesterday from her path report?       She called this morning and wants to know what we are sending in for her.             Notes Recorded by Marylou Mccoy, LPN on 12/02/9379 at 0:17 PM:            Pt is aware. She was given: biaxin 500 bid x14d, flagyl 500 bid x14d, prevacid 30 bid x14d. Pt stated she did not finish the abx d/t they made her sick. Pt allergic to ampicillin. She stated Dr. Loetta Rough gave her another rx for doxycycline which she took for 7 days when she stopped the hpylori meds. Pt has ov on 08/10/13 with RMR.            Notes Recorded by Marylou Mccoy, LPN on 10/04/2583 at 27:78 AM      Tried to call pt- LMOM            Notes Recorded by Rosanne Sack, CMA on 07/14/2013 at 10:41 AM      Tried to call with no answer            Notes Recorded by Daneil Dolin, MD on 07/13/2013 at 12:19 PM:      Still has H. pylori. Please find out what regimen she took and for how long previously.  She will likely have a resistant organism which will be more difficult to eradicate. Further recommendations to follow once I have the above information available upon which to make clinical decisions.             ------

## 2013-08-03 NOTE — Telephone Encounter (Signed)
LMOM for pt to call and ask to speak to Dr. Roseanne Kaufman nurse.

## 2013-08-03 NOTE — Telephone Encounter (Signed)
Referral has been made to ID

## 2013-08-03 NOTE — Telephone Encounter (Signed)
Pt is aware of recommendations. She would like asap referral to ID if possible. Leigh ann please refer pt.   Vicente Males, pt is c/o nausea and vomiting and wants to know if we can send in something for nausea so she can go to work while she is waiting to see ID?

## 2013-08-03 NOTE — Telephone Encounter (Signed)
Spoke with pt, please see other phone note.

## 2013-08-04 ENCOUNTER — Telehealth: Payer: Self-pay | Admitting: Family Medicine

## 2013-08-04 ENCOUNTER — Other Ambulatory Visit: Payer: Self-pay

## 2013-08-04 DIAGNOSIS — R768 Other specified abnormal immunological findings in serum: Secondary | ICD-10-CM

## 2013-08-04 MED ORDER — LANSOPRAZOLE 30 MG PO CPDR: 30.0000 mg | DELAYED_RELEASE_CAPSULE | Freq: Two times a day (BID) | ORAL | Status: AC

## 2013-08-04 MED ORDER — METRONIDAZOLE 500 MG PO TABS: 500.0000 mg | ORAL_TABLET | Freq: Two times a day (BID) | ORAL | Status: AC

## 2013-08-04 MED ORDER — CLARITHROMYCIN 500 MG PO TABS: 500.0000 mg | ORAL_TABLET | Freq: Two times a day (BID) | ORAL | Status: AC

## 2013-08-04 NOTE — Telephone Encounter (Signed)
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

## 2013-08-04 NOTE — Telephone Encounter (Signed)
Pls see the response to this message

## 2013-08-04 NOTE — Telephone Encounter (Signed)
Pt states she had to stop the prevpak for h pylori after 5 days because she thought it was upsetting her stomach but she has been having the same problems with nausea and upset stomach for 3 weeks now and wants to go back on the prevpak. Dr Gala Romney mentioned something about sending her to the ID clinic because she didn't finish the meds and her body was probably immune to it but she thinks its unnecessary. She took 5 days of prevpak before. Wants it called back in to the pharmacy today please.  May need work note for yesterday and today if possible.

## 2013-08-04 NOTE — Telephone Encounter (Signed)
Patient aware. May not need work note. Will check tonight at work and call if she needs to come in. Prevpak sent again per Drs earlier instruction and she will contact GI if anymore problems once completed

## 2013-08-05 NOTE — Telephone Encounter (Signed)
Noted, thanks!

## 2013-08-10 ENCOUNTER — Ambulatory Visit: Payer: PRIVATE HEALTH INSURANCE | Admitting: Family Medicine

## 2013-08-10 ENCOUNTER — Encounter: Payer: PRIVATE HEALTH INSURANCE | Admitting: Internal Medicine

## 2013-08-16 ENCOUNTER — Ambulatory Visit: Payer: PRIVATE HEALTH INSURANCE | Admitting: Internal Medicine

## 2013-08-17 ENCOUNTER — Encounter: Payer: PRIVATE HEALTH INSURANCE | Admitting: Internal Medicine

## 2013-08-27 ENCOUNTER — Telehealth: Payer: Self-pay | Admitting: Family Medicine

## 2013-08-30 ENCOUNTER — Encounter (INDEPENDENT_AMBULATORY_CARE_PROVIDER_SITE_OTHER): Payer: Self-pay

## 2013-08-30 ENCOUNTER — Other Ambulatory Visit: Payer: Self-pay

## 2013-08-30 ENCOUNTER — Ambulatory Visit (INDEPENDENT_AMBULATORY_CARE_PROVIDER_SITE_OTHER): Payer: PRIVATE HEALTH INSURANCE

## 2013-08-30 VITALS — BP 104/72 | Wt 192.0 lb

## 2013-08-30 DIAGNOSIS — M549 Dorsalgia, unspecified: Secondary | ICD-10-CM

## 2013-08-30 MED ORDER — HYDROCODONE-ACETAMINOPHEN 7.5-325 MG PO TABS: ORAL_TABLET | ORAL | Status: DC

## 2013-08-30 NOTE — Telephone Encounter (Signed)
Patient in for injections.  

## 2013-08-30 NOTE — Telephone Encounter (Signed)
Called patient and left message for them to return call at the office   

## 2013-08-30 NOTE — Telephone Encounter (Signed)
Pls let patient know and schedule an appointment to be seen after 4/22 when I am available. Multiple issues , also since getting vicodin on a regular basis , from this office she needs to be seen every 4 month. I advise and please send in flexeril 10 mg one at bedtime as needed for back spasm and send in 30 refill zero. OK toradol 60mg  im only

## 2013-08-30 NOTE — Telephone Encounter (Signed)
When her period is coming on she has really bad cramping pains in her lower back. Pain at a 10. Was worse lastweek but its getting some better now but still hurts. Wants pain shot if able.  Also around her period time everymonth she gets a spasm/pulled muscle feeling in left breast. Goes away but comes back monthly. Has been using warm compresses. Wants to know what else is recommended. Just had normal mammo also.

## 2013-08-30 NOTE — Telephone Encounter (Signed)
Continued phone niote, a  Also depo medrol 80 mg im for the back pain. If not getting vicodin every month, no vicodin prescription to be provided before next visit

## 2013-08-30 NOTE — Telephone Encounter (Signed)
Patient coming in to have T60 and D80 and to collect her pain rx

## 2013-08-31 DIAGNOSIS — M549 Dorsalgia, unspecified: Secondary | ICD-10-CM

## 2013-08-31 MED ORDER — KETOROLAC TROMETHAMINE 60 MG/2ML IM SOLN
60.0000 mg | Freq: Once | INTRAMUSCULAR | Status: AC
  Administered 2013-08-31: 60 mg via INTRAMUSCULAR

## 2013-08-31 MED ORDER — METHYLPREDNISOLONE ACETATE 80 MG/ML IJ SUSP
80.0000 mg | Freq: Once | INTRAMUSCULAR | Status: AC
  Administered 2013-08-31: 80 mg via INTRAMUSCULAR

## 2013-08-31 NOTE — Progress Notes (Signed)
Patient in for injections for pain control.  Injections of depo-medrol and Toradol given IM as ordered.  No sign or symptoms of adverse reactions noted.  No voiced complaints.  Patient also collected script for Hydrocodone.

## 2013-10-01 ENCOUNTER — Other Ambulatory Visit: Payer: Self-pay

## 2013-10-01 MED ORDER — HYDROCODONE-ACETAMINOPHEN 7.5-325 MG PO TABS: ORAL_TABLET | ORAL | Status: DC

## 2013-10-20 ENCOUNTER — Ambulatory Visit (INDEPENDENT_AMBULATORY_CARE_PROVIDER_SITE_OTHER): Payer: PRIVATE HEALTH INSURANCE | Admitting: Family Medicine

## 2013-10-20 ENCOUNTER — Encounter (INDEPENDENT_AMBULATORY_CARE_PROVIDER_SITE_OTHER): Payer: Self-pay

## 2013-10-20 ENCOUNTER — Encounter: Payer: Self-pay | Admitting: Family Medicine

## 2013-10-20 VITALS — BP 118/72 | HR 84 | Resp 16 | Ht 64.0 in | Wt 193.1 lb

## 2013-10-20 DIAGNOSIS — R609 Edema, unspecified: Secondary | ICD-10-CM

## 2013-10-20 DIAGNOSIS — R0789 Other chest pain: Secondary | ICD-10-CM

## 2013-10-20 DIAGNOSIS — F4389 Other reactions to severe stress: Secondary | ICD-10-CM

## 2013-10-20 DIAGNOSIS — G44219 Episodic tension-type headache, not intractable: Secondary | ICD-10-CM

## 2013-10-20 DIAGNOSIS — R7301 Impaired fasting glucose: Secondary | ICD-10-CM

## 2013-10-20 DIAGNOSIS — R519 Headache, unspecified: Secondary | ICD-10-CM | POA: Insufficient documentation

## 2013-10-20 DIAGNOSIS — M545 Low back pain, unspecified: Secondary | ICD-10-CM | POA: Insufficient documentation

## 2013-10-20 DIAGNOSIS — F411 Generalized anxiety disorder: Secondary | ICD-10-CM

## 2013-10-20 DIAGNOSIS — K219 Gastro-esophageal reflux disease without esophagitis: Secondary | ICD-10-CM

## 2013-10-20 DIAGNOSIS — M549 Dorsalgia, unspecified: Secondary | ICD-10-CM

## 2013-10-20 DIAGNOSIS — G44211 Episodic tension-type headache, intractable: Secondary | ICD-10-CM

## 2013-10-20 DIAGNOSIS — M5431 Sciatica, right side: Secondary | ICD-10-CM | POA: Insufficient documentation

## 2013-10-20 DIAGNOSIS — R51 Headache: Secondary | ICD-10-CM

## 2013-10-20 DIAGNOSIS — M5440 Lumbago with sciatica, unspecified side: Secondary | ICD-10-CM

## 2013-10-20 DIAGNOSIS — F419 Anxiety disorder, unspecified: Secondary | ICD-10-CM | POA: Insufficient documentation

## 2013-10-20 DIAGNOSIS — F438 Other reactions to severe stress: Secondary | ICD-10-CM

## 2013-10-20 DIAGNOSIS — M5430 Sciatica, unspecified side: Secondary | ICD-10-CM

## 2013-10-20 LAB — HEMOGLOBIN A1C
Hgb A1c MFr Bld: 5.3 % (ref <5.7)
MEAN PLASMA GLUCOSE: 105 mg/dL (ref <117)

## 2013-10-20 LAB — BASIC METABOLIC PANEL WITH GFR
BUN: 10 mg/dL (ref 6–23)
CO2: 29 mEq/L (ref 19–32)
Calcium: 9.1 mg/dL (ref 8.4–10.5)
Chloride: 102 mEq/L (ref 96–112)
Creat: 0.72 mg/dL (ref 0.50–1.10)
GFR, Est African American: >89 mL/min
GFR, Est Non African American: >89 mL/min
Glucose, Bld: 93 mg/dL (ref 70–99)
Potassium: 4.1 mEq/L (ref 3.5–5.3)
Sodium: 138 mEq/L (ref 135–145)

## 2013-10-20 MED ORDER — BUTALBITAL-APAP-CAFFEINE 50-325-40 MG PO TABS: ORAL_TABLET | ORAL | Status: DC

## 2013-10-20 MED ORDER — IBUPROFEN 800 MG PO TABS: ORAL_TABLET | ORAL | Status: DC

## 2013-10-20 MED ORDER — ALPRAZOLAM 0.25 MG PO TABS: ORAL_TABLET | ORAL | Status: DC

## 2013-10-20 MED ORDER — FUROSEMIDE 20 MG PO TABS: ORAL_TABLET | ORAL | Status: DC

## 2013-10-20 MED ORDER — KETOROLAC TROMETHAMINE 60 MG/2ML IM SOLN
60.0000 mg | Freq: Once | INTRAMUSCULAR | Status: AC
Start: 2013-10-20 — End: 2013-10-20
  Administered 2013-10-20: 60 mg via INTRAMUSCULAR

## 2013-10-20 NOTE — Patient Instructions (Signed)
F/u in 3.5 month, calll if t you need me before  Chest pain is due to inflammation of cartilage  Toradol 61m iM in office for generalized pain  New for headache is fioricet, and limit the use of ibuprofen, as discussed  Xanax prescribed in limited quantity 30 to last 6 month  Furosemide will be refilled  Chem 7, EGFr and hBA1C today

## 2013-10-29 ENCOUNTER — Telehealth: Payer: Self-pay

## 2013-10-29 ENCOUNTER — Other Ambulatory Visit: Payer: Self-pay

## 2013-10-29 MED ORDER — PREDNISONE 5 MG PO KIT
PACK | ORAL | Status: DC
Start: 2013-10-29 — End: 2013-11-24

## 2013-10-29 MED ORDER — HYDROCODONE-ACETAMINOPHEN 7.5-325 MG PO TABS: ORAL_TABLET | ORAL | Status: DC

## 2013-10-29 NOTE — Addendum Note (Signed)
Addended by: Denman George B on: 10/29/2013 12:57 PM   Modules accepted: Orders

## 2013-10-29 NOTE — Telephone Encounter (Signed)
Has been only 2 month since last steroid injection, pls advise needs to wait for 3 month, may benefit more from epidural injections from paion s[pecialist, oK to refer to doc of her choice if she agrees. Pls erx prednisone 5mg  dose pack #21 for her today for the back pain and let her know

## 2013-10-29 NOTE — Telephone Encounter (Signed)
Called and left message for patient.  rx sent to pharmacy.

## 2013-11-10 ENCOUNTER — Encounter: Payer: Self-pay | Admitting: Family Medicine

## 2013-11-11 ENCOUNTER — Telehealth: Payer: Self-pay

## 2013-11-11 NOTE — Telephone Encounter (Signed)
pls send in and advise keflex 500mg  twice daily #10 to reduce risk of , or treat any bacterial superinfection and also increase  benadryl  25 mg to  two tabs  Every 6 to 8 hours for itch.Pred 5mg  one twice daily #10 will help with the itch also so pls erx after you spk with her the prednisone and keflex.  Will need to go to urgent care if does not resolve in the next 3 days or worsens

## 2013-11-11 NOTE — Telephone Encounter (Signed)
States x 1 week she had bites all over her shoulders, chest and arms. They itch and she has been scratching them and some of them look like blisters. None of them are draining right now. Has been taking benadryl but its not helping them go away. Wants to know if you can call in something or does she need to be seen first? Please advise

## 2013-11-12 ENCOUNTER — Other Ambulatory Visit: Payer: Self-pay

## 2013-11-12 MED ORDER — PHENTERMINE HCL 37.5 MG PO TABS: ORAL_TABLET | ORAL | Status: DC

## 2013-11-12 MED ORDER — PREDNISONE 5 MG PO TABS: 5.0000 mg | ORAL_TABLET | Freq: Two times a day (BID) | ORAL | Status: DC

## 2013-11-12 MED ORDER — CEPHALEXIN 500 MG PO CAPS: 500.0000 mg | ORAL_CAPSULE | Freq: Two times a day (BID) | ORAL | Status: DC

## 2013-11-12 NOTE — Telephone Encounter (Signed)
Patient aware and med sent  

## 2013-11-15 ENCOUNTER — Emergency Department (INDEPENDENT_AMBULATORY_CARE_PROVIDER_SITE_OTHER)
Admission: EM | Admit: 2013-11-15 | Discharge: 2013-11-15 | Disposition: A | Discharge status: Home / Self Care | Payer: PRIVATE HEALTH INSURANCE | Source: Home / Self Care | Attending: Emergency Medicine | Admitting: Emergency Medicine

## 2013-11-15 ENCOUNTER — Encounter (HOSPITAL_COMMUNITY): Payer: Self-pay | Admitting: Emergency Medicine

## 2013-11-15 DIAGNOSIS — R52 Pain, unspecified: Secondary | ICD-10-CM

## 2013-11-15 DIAGNOSIS — B9789 Other viral agents as the cause of diseases classified elsewhere: Secondary | ICD-10-CM

## 2013-11-15 DIAGNOSIS — B349 Viral infection, unspecified: Secondary | ICD-10-CM

## 2013-11-15 LAB — POCT URINALYSIS DIP (DEVICE)
Bilirubin Urine: NEGATIVE
Glucose, UA: NEGATIVE mg/dL
Hgb urine dipstick: NEGATIVE
KETONES UR: NEGATIVE mg/dL
Leukocytes, UA: NEGATIVE
Nitrite: NEGATIVE
PROTEIN: NEGATIVE mg/dL
Urobilinogen, UA: 0.2 mg/dL (ref 0.0–1.0)
pH: 5.5 (ref 5.0–8.0)

## 2013-11-15 LAB — POCT PREGNANCY, URINE: PREG TEST UR: NEGATIVE

## 2013-11-15 LAB — POCT I-STAT, CHEM 8
BUN: 18 mg/dL (ref 6–23)
Calcium, Ion: 1.29 mmol/L — ABNORMAL HIGH (ref 1.12–1.23)
Chloride: 106 mEq/L (ref 96–112)
Creatinine, Ser: 0.7 mg/dL (ref 0.50–1.10)
Glucose, Bld: 102 mg/dL — ABNORMAL HIGH (ref 70–99)
HCT: 45 % (ref 36.0–46.0)
HEMOGLOBIN: 15.3 g/dL — AB (ref 12.0–15.0)
Potassium: 4.2 mEq/L (ref 3.7–5.3)
SODIUM: 142 meq/L (ref 137–147)
TCO2: 24 mmol/L (ref 0–100)

## 2013-11-15 MED ORDER — OXYCODONE-ACETAMINOPHEN 5-325 MG PO TABS: 1.0000 | ORAL_TABLET | ORAL | Status: DC | PRN

## 2013-11-15 MED ORDER — DOXYCYCLINE HYCLATE 100 MG PO CAPS: 100.0000 mg | ORAL_CAPSULE | Freq: Two times a day (BID) | ORAL | Status: DC

## 2013-11-15 NOTE — ED Provider Notes (Signed)
CSN: [id number]     Arrival date & time 11/15/13  1704 History   First MD Initiated Contact with Patient 11/15/13 1852     Chief Complaint  Patient presents with  . Back Pain   (Consider location/radiation/quality/duration/timing/severity/associated sxs/prior Treatment) HPI Comments: 48 year old female presents complaining of back pain, abdominal pain. This started 2 days ago. She rates the pain as 12 out of 10 in severity, although she is sitting comfortably on the bed smiling and laughing without any apparent discomfort. The pain moves around her back, side, and stomach constantly. She did not have any associated symptoms of nausea, vomiting, diarrhea. She has taken Tylenol for treatment. No previous history of similar issues. No previous history of high blood pressure. No known injury   Past Medical History  Diagnosis Date  . Chronic back pain   . Nicotine addiction   . Obesity   . Hemorrhoids   . Gastritis   . Ovarian cyst    Past Surgical History  Procedure Laterality Date  . Right inguinal hernia repair  1982  . Right carpal tunnel release  1991  . Right knee surgery  1993  . Btl and endometrial abaltion    . Cholecystectomy  2007    Dr. Aviva Signs   . Left knee surgey- arthroscopy june 2011      Dr. Percell Miller   . Tubal ligation    . Esophagogastroduodenoscopy   08/19/2008    Dr. Volney American esophageal erosions, consistent with erosive reflux esophagitis/small HH/The remainder of her upper GI tract appeared normal  . Colonoscopy   08/19/2008    Dr. Madolyn Frieze canal hemorrhoids.  Diminutive rectal polyp  status post cold biopsy and removal.  The remainder of rectal mucosa and colon was normal. Path: polypoid.   . Colonoscopy with esophagogastroduodenoscopy (egd) N/A 07/08/2013    Procedure: COLONOSCOPY WITH ESOPHAGOGASTRODUODENOSCOPY (EGD);  Surgeon: Daneil Dolin, MD;  Location: [facility];  Service: Endoscopy;  Laterality: N/A;  1:00   Family History  Problem Relation  Age of Onset  . Hypertension Mother   . Stroke Mother   . Hypertension Father   . Colon cancer Neg Hx    History  Substance Use Topics  . Smoking status: Former Smoker -- 0.50 packs/day    Types: Cigarettes  . Smokeless tobacco: Never Used     Comment: quit Mar 03, 2013   . Alcohol Use: Yes     Comment: occasionally   OB History   Grav Para Term Preterm Abortions TAB SAB Ect Mult Living                 Review of Systems  Gastrointestinal: Positive for abdominal pain.  Musculoskeletal: Positive for back pain.  All other systems reviewed and are negative.   Allergies  Review of patient's allergies indicates no known allergies.  Home Medications   Prior to Admission medications   Medication Sig Start Date End Date Taking? Authorizing Provider  cephALEXin (KEFLEX) 500 MG capsule Take 1 capsule (500 mg total) by mouth 2 (two) times daily. 11/12/13  Yes Fayrene Helper, MD  furosemide (LASIX) 20 MG tablet One tablet once daily, as needed, for fluid retention/swelling 10/20/13 02/17/14 Yes Fayrene Helper, MD  ibuprofen (ADVIL,MOTRIN) 800 MG tablet One tablet twice daily, as needed, for uncontrolled headache.  Maximum is 55tablets per  month 10/20/13  Yes Fayrene Helper, MD  Multiple Vitamin (MULITIVITAMIN WITH MINERALS) TABS Take 1 tablet by mouth daily.   Yes Historical Provider,  MD  predniSONE (DELTASONE) 5 MG tablet Take 1 tablet (5 mg total) by mouth 2 (two) times daily with a meal. 11/12/13  Yes Fayrene Helper, MD  PredniSONE 5 MG KIT As directed 10/29/13  Yes Fayrene Helper, MD  ALPRAZolam Duanne Moron) 0.25 MG tablet One tablet once daily, as needed, for anxiety  Thirty tablets to last 6 months 10/20/13   Fayrene Helper, MD  Ascorbic Acid (VITAMIN C) 1000 MG tablet Take 1,000 mg by mouth daily.    Historical Provider, MD  Biotin 10 MG CAPS Take by mouth daily.    Historical Provider, MD  butalbital-acetaminophen-caffeine (FIORICET, ESGIC) [phone] MG per tablet  One tablet twice daily, as needed, for headache 10/20/13   Fayrene Helper, MD  Cyanocobalamin (VITAMIN B 12) 250 MCG LOZG Take by mouth daily.    Historical Provider, MD  diphenhydrAMINE (BENADRYL) 25 MG tablet Take 50 mg by mouth daily as needed. For itching    Historical Provider, MD  doxycycline (VIBRAMYCIN) 100 MG capsule Take 1 capsule (100 mg total) by mouth 2 (two) times daily. 11/15/13   Liam Graham, PA-C  esomeprazole (NEXIUM) 40 MG capsule Take 1 capsule (40 mg total) by mouth daily before breakfast. 06/24/13   Orvil Feil, NP  HYDROcodone-acetaminophen [facility]) 7.5-325 MG per tablet One tablet  at bedtime, as needed, for uncontrolled  pain 10/29/13   Fayrene Helper, MD  oxyCODONE-acetaminophen (ROXICET) 5-325 MG per tablet Take 1 tablet by mouth every 4 (four) hours as needed for severe pain. 11/15/13   Liam Graham, PA-C  phentermine (ADIPEX-P) 37.5 MG tablet Take 1/2 tab daily 11/12/13   Fayrene Helper, MD  potassium chloride SA (K-DUR,KLOR-CON) 20 MEQ tablet One tablet twice daily, use on days that lasix is taken for swelling 04/13/13 08/11/13  Fayrene Helper, MD  Zinc 15 MG CAPS Take 1 capsule by mouth daily.    Historical Provider, MD   BP 192/88  Pulse 64  Temp(Src) 98.4 F (36.9 C) (Oral)  Resp 16  SpO2 99% Physical Exam  Nursing note and vitals reviewed. Constitutional: She is oriented to person, place, and time. Vital signs are normal. She appears well-developed and well-nourished. No distress.  HENT:  Head: Normocephalic and atraumatic.  Right Ear: External ear normal.  Left Ear: External ear normal.  Nose: Nose normal.  Mouth/Throat: Oropharynx is clear and moist. No oropharyngeal exudate.  Eyes: Conjunctivae are normal. Right eye exhibits no discharge. Left eye exhibits no discharge.  Neck: Normal range of motion. Neck supple. No JVD present.  Cardiovascular: Normal rate, regular rhythm and normal heart sounds.  Exam reveals no gallop and no friction  rub.   No murmur heard. Pulmonary/Chest: Effort normal and breath sounds normal. No respiratory distress. She has no wheezes. She has no rales. She exhibits tenderness (left inferior posterior lateral rib cage ).  Abdominal: Normal appearance and bowel sounds are normal. There is no hepatosplenomegaly. There is tenderness (mild) in the left lower quadrant. There is no rigidity, no rebound, no guarding, no CVA tenderness, no tenderness at McBurney's point and negative Murphy's sign.  Lymphadenopathy:    She has no cervical adenopathy.  Neurological: She is alert and oriented to person, place, and time. She has normal strength. Coordination normal.  Skin: Skin is warm and dry. No rash noted. She is not diaphoretic.  Psychiatric: She has a normal mood and affect. Judgment normal.    ED Course  Procedures (including critical care time)  Labs Review Labs Reviewed  POCT I-STAT, CHEM 8 - Abnormal; Notable for the following:    Glucose, Bld 102 (*)    Calcium, Ion 1.29 (*)    Hemoglobin 15.3 (*)    All other components within normal limits  POCT URINALYSIS DIP (DEVICE)  POCT PREGNANCY, URINE    Imaging Review No results found.   MDM   1. Body aches   2. Viral illness    Unsure of the cause of her symptoms. Maybe some sort of viral illness, kidney function is fine. Blood pressure may be elevated due to pain. Treat with small quantity of pain medication and also doxycycline to cover for any sort of atypical infection. Followup in a few days if not getting better.  Meds ordered this encounter  Medications  . oxyCODONE-acetaminophen (ROXICET) 5-325 MG per tablet    Sig: Take 1 tablet by mouth every 4 (four) hours as needed for severe pain.    Dispense:  20 tablet    Refill:  0    Order Specific Question:  Supervising Provider    Answer:  Jake Michaelis, DAVID C [id number]  . doxycycline (VIBRAMYCIN) 100 MG capsule    Sig: Take 1 capsule (100 mg total) by mouth 2 (two) times daily.    Dispense:   20 capsule    Refill:  0    Order Specific Question:  Supervising Provider    Answer:  Jake Michaelis, DAVID C [place], PA-C 11/16/13 [phone]

## 2013-11-15 NOTE — Discharge Instructions (Signed)
Pain of Unknown Etiology (Pain Without a Known Cause) You have come to your caregiver because of pain. Pain can occur in any part of the body. Often there is not a definite cause. If your laboratory (blood or urine) work was normal and X-rays or other studies were normal, your caregiver may treat you without knowing the cause of the pain. An example of this is the headache. Most headaches are diagnosed by taking a history. This means your caregiver asks you questions about your headaches. Your caregiver determines a treatment based on your answers. Usually testing done for headaches is normal. Often testing is not done unless there is no response to medications. Regardless of where your pain is located today, you can be given medications to make you comfortable. If no physical cause of pain can be found, most cases of pain will gradually leave as suddenly as they came.  If you have a painful condition and no reason can be found for the pain, it is important that you follow up with your caregiver. If the pain becomes worse or does not go away, it may be necessary to repeat tests and look further for a possible cause.  Only take over-the-counter or prescription medicines for pain, discomfort, or fever as directed by your caregiver.  For the protection of your privacy, test results cannot be given over the phone. Make sure you receive the results of your test. Ask how these results are to be obtained if you have not been informed. It is your responsibility to obtain your test results.  You may continue all activities unless the activities cause more pain. When the pain lessens, it is important to gradually resume normal activities. Resume activities by beginning slowly and gradually increasing the intensity and duration of the activities or exercise. During periods of severe pain, bed rest may be helpful. Lie or sit in any position that is comfortable.  Ice used for acute (sudden) conditions may be effective.  Use a large plastic bag filled with ice and wrapped in a towel. This may provide pain relief.  See your caregiver for continued problems. Your caregiver can help or refer you for exercises or physical therapy if necessary. If you were given medications for your condition, do not drive, operate machinery or power tools, or sign legal documents for 24 hours. Do not drink alcohol, take sleeping pills, or take other medications that may interfere with treatment. See your caregiver immediately if you have pain that is becoming worse and not relieved by medications. Document Released: 02/05/2001 Document Revised: 03/03/2013 Document Reviewed: 05/13/2005 [facility] Patient Information 2015 [place], [place]. This information is not intended to replace advice given to you by your health care provider. Make sure you discuss any questions you have with your health care provider.  Viral Infections A virus is a type of germ. Viruses can cause:  Minor sore throats.  Aches and pains.  Headaches.  Runny nose.  Rashes.  Watery eyes.  Tiredness.  Coughs.  Loss of appetite.  Feeling sick to your stomach (nausea).  Throwing up (vomiting).  Watery poop (diarrhea). HOME CARE   Only take medicines as told by your doctor.  Drink enough water and fluids to keep your pee (urine) clear or pale yellow. Sports drinks are a good choice.  Get plenty of rest and eat healthy. Soups and broths with crackers or rice are fine. GET HELP RIGHT AWAY IF:   You have a very bad headache.  You have shortness of breath.  You have chest pain or neck pain.  You have an unusual rash.  You cannot stop throwing up.  You have watery poop that does not stop.  You cannot keep fluids down. MAKE SURE YOU:   Understand these instructions.  Will watch this condition.  Will get help right away if you are not doing well or get worse. Document Released: 04/25/2008 Document Revised: 08/05/2011 Document Reviewed:  09/18/2010 [facility] Patient Information 2015 [place], [place]. This information is not intended to replace advice given to you by your health care provider. Make sure you discuss any questions you have with your health care provider.

## 2013-11-15 NOTE — ED Notes (Signed)
C/o pain in her right back/side for past couple of days. Seen by her MD in [place], [place], Rx Cephalexin, prednisone, motrin

## 2013-11-17 NOTE — ED Provider Notes (Signed)
Medical screening examination/treatment/procedure(s) were performed by non-physician practitioner and as supervising physician I was immediately available for consultation/collaboration.  Philipp Deputy, M.D.  Harden Mo, MD 11/17/13 1003

## 2013-11-22 ENCOUNTER — Telehealth: Payer: Self-pay | Admitting: Family Medicine

## 2013-11-23 ENCOUNTER — Telehealth: Payer: Self-pay | Admitting: Family Medicine

## 2013-11-23 NOTE — Telephone Encounter (Signed)
Patient called and stated she believes she has chicken pox/shingles. She was wanting to know what she can do for that or if she can come in for an appointment tomorrow. Please advise. Patient phone is [phone] or work [phone]

## 2013-11-23 NOTE — Telephone Encounter (Signed)
Patient given appt for 7/1

## 2013-11-24 ENCOUNTER — Encounter: Payer: Self-pay | Admitting: Family Medicine

## 2013-11-24 ENCOUNTER — Other Ambulatory Visit: Payer: Self-pay

## 2013-11-24 ENCOUNTER — Ambulatory Visit (INDEPENDENT_AMBULATORY_CARE_PROVIDER_SITE_OTHER): Payer: PRIVATE HEALTH INSURANCE | Admitting: Family Medicine

## 2013-11-24 VITALS — BP 118/64 | HR 80 | Temp 99.7°F | Resp 18 | Wt 197.1 lb

## 2013-11-24 DIAGNOSIS — B029 Zoster without complications: Secondary | ICD-10-CM

## 2013-11-24 DIAGNOSIS — N771 Vaginitis, vulvitis and vulvovaginitis in diseases classified elsewhere: Secondary | ICD-10-CM

## 2013-11-24 DIAGNOSIS — F064 Anxiety disorder due to known physiological condition: Secondary | ICD-10-CM

## 2013-11-24 MED ORDER — GABAPENTIN 100 MG PO CAPS: 100.0000 mg | ORAL_CAPSULE | Freq: Every day | ORAL | Status: DC

## 2013-11-24 MED ORDER — PREDNISONE 5 MG PO TABS: 5.0000 mg | ORAL_TABLET | Freq: Two times a day (BID) | ORAL | Status: AC

## 2013-11-24 MED ORDER — HYDROXYZINE HCL 25 MG PO TABS: 25.0000 mg | ORAL_TABLET | Freq: Three times a day (TID) | ORAL | Status: DC | PRN

## 2013-11-24 MED ORDER — HYDROCODONE-ACETAMINOPHEN 7.5-325 MG PO TABS: ORAL_TABLET | ORAL | Status: DC

## 2013-11-24 MED ORDER — ACYCLOVIR 800 MG PO TABS: 800.0000 mg | ORAL_TABLET | Freq: Every day | ORAL | Status: DC

## 2013-11-24 MED ORDER — FLUCONAZOLE 150 MG PO TABS: ORAL_TABLET | ORAL | Status: AC

## 2013-11-24 NOTE — Patient Instructions (Signed)
F/u as before   You are treated for shingles , work excuse, from today to return July 6  Medications are sent to [facility] (4 total)  Call back on Monday if not better or continuing to worsen , I will refer you to a dermatologist if that is the case.  Stop doxycycline  Shingles Shingles is caused by the same virus that causes chickenpox. The first feelings may be pain or tingling. A rash will follow in a couple days. The rash may occur on any area of the body. Long-lasting pain is more likely in an elderly person. It can last months to years. There are medicines that can help prevent pain if you start taking them early. HOME CARE   Place cool cloths on the rash.  Only take medicine as told by your doctor.  You may use calamine lotion to relieve itchy skin.  Avoid touching:  Babies.  Children with inflamed skin (eczema).  People who have gotten transplanted organs.  People with chronic illnesses, such as leukemia and AIDS.  If the rash is on the face, you may need to see a specialist. Keep all appointments. Shingles must be kept away from the eyes, if possible.  Keep all appointments.  Avoid touching the eyes or eye area, if possible. GET HELP RIGHT AWAY IF:   You have any pain on the face or eye.  Your medicines do not help.  Your redness or puffiness (swelling) spreads.  You have a fever.  You notice any red lines going away from the rash area. MAKE SURE YOU:   Understand these instructions.  Will watch your condition.  Will get help right away if you are not doing well or get worse. Document Released: 10/30/2007 Document Revised: 08/05/2011 Document Reviewed: 10/30/2007 [facility] Patient Information 2015 [place], [place]. This information is not intended to replace advice given to you by your health care provider. Make sure you discuss any questions you have with your health care provider.

## 2013-11-28 DIAGNOSIS — B029 Zoster without complications: Secondary | ICD-10-CM | POA: Insufficient documentation

## 2013-11-28 NOTE — Assessment & Plan Note (Signed)
Currently controlled and stable, no med change

## 2013-11-28 NOTE — Progress Notes (Signed)
   Subjective:    Patient ID: Anna Cantu, female    DOB: [birth year], 48 y.o.   MRN: [id number]  HPI 2 week h/o significant itching and rash involving face and upper arms, "just like when I had chicken pox". Low grade temp with chills, no other person similarly affected. Treated with doxycycline by urgent care ;lasdt week , no improvement, new lesions appearing on the face. I may get vaginal yeast infection and would like fluconazole    Review of Systems See HPI Denies recent fever or chills. Denies sinus pressure, nasal congestion, or sore throat.c/o left ear pain x 1 week Denies chest congestion, productive cough or wheezing. Denies chest pains, palpitations and leg swelling Denies abdominal pain, nausea, vomiting,diarrhea or constipation.   Denies dysuria, frequency, hesitancy or incontinence. . Denies depression, or uncontrolled anxiety does have  Insomnia due to excessive itching        Objective:   Physical Exam BP 118/64  Pulse 80  Temp(Src) 99.7 F (37.6 C)  Resp 18  Wt 197 lb 1.3 oz (89.395 kg)  SpO2 97% Patient alert and oriented and in no cardiopulmonary distress.Pt uncomfortable due to pruritic rash  HEENT: No facial asymmetry, EOMI,   oropharynx pink and moist.  Neck supple no JVD, no mass. TM clear bilaterally, good light reflex, no evidence of inflammation or infection in left ear Chest: Clear to auscultation bilaterally.  CVS: S1, S2 no murmurs, no S3.Regular rate.  ABD: Soft non tender.   Ext: No edema  MS: Adequate ROM spine, shoulders, hips and knees.  Skin: erythematous maculo papular rash in crops affecting face and both upper extremities, some obviously recently erupted, some healing. No open skin lesions, no purulent drainage  Psych: Good eye contact, normal affect. Memory intact mildly  anxious not depressed appearing.  CNS: CN 2-12 intact, power,  normal throughout.no focal deficits noted.        Assessment & Plan:    Shingles Acute outbreak of shingles affecting face and upper extremities, acyclovir and work excuse t to return on 7/6 , still has new lesions appearing, though none are currently open  Sunflower  Currently controlled and stable, no med change

## 2013-11-28 NOTE — Assessment & Plan Note (Signed)
Acute outbreak of shingles affecting face and upper extremities, acyclovir and work excuse t to return on 7/6 , still has new lesions appearing, though none are currently open

## 2013-11-29 NOTE — Telephone Encounter (Signed)
Pt called wanting to speak with a nurse. Please advise [phone]

## 2013-12-02 NOTE — Telephone Encounter (Signed)
Patient states that since she started Acyclovir she has been having " hot flash" sensations where she breaks out in sweats and feels like she will pass out.  Has not passed out though.  Is wondering if this may be a side effect or if md thinks it is hot flashes.  Please advise.

## 2013-12-03 NOTE — Telephone Encounter (Signed)
Advise I do not know of this as a s/e of the acyclovir , and I recommend she discontinue the medication esp since her symptoms are severe enough that she feels as though she will "pass out" Also let her know that hot flashes are not generally considered a cause of "near passing out" Needs to ensure  She is keeping well hydrated and cool, if she continues to feel this way will need cardiology evaluation

## 2013-12-08 NOTE — Telephone Encounter (Signed)
Patient is aware and states that if she continues to have episodes she will call for referral.

## 2013-12-15 ENCOUNTER — Other Ambulatory Visit: Payer: Self-pay

## 2013-12-15 DIAGNOSIS — R21 Rash and other nonspecific skin eruption: Secondary | ICD-10-CM

## 2013-12-15 DIAGNOSIS — M542 Cervicalgia: Secondary | ICD-10-CM

## 2013-12-15 MED ORDER — IBUPROFEN 800 MG PO TABS: ORAL_TABLET | ORAL | Status: DC

## 2013-12-15 MED ORDER — SILVER SULFADIAZINE 1 % EX CREA: TOPICAL_CREAM | CUTANEOUS | Status: DC

## 2013-12-17 ENCOUNTER — Telehealth: Payer: Self-pay | Admitting: Family Medicine

## 2013-12-17 MED ORDER — PREDNISONE (PAK) 5 MG PO TABS: 5.0000 mg | ORAL_TABLET | ORAL | Status: DC

## 2013-12-17 NOTE — Telephone Encounter (Signed)
I sent a dose pack to Anna Cantu needs to make an appt with a dermatologist for further evaluation of the "rash" as she is having many recurrences with unclear underlying problem pls explain this to her , OK to enter referral to dermatologist , I will sign, she needs this. Also let her understand that  I will not repeatedly prescribe prednisone , since like all medications , there are  multiple potentially adverse/unwanted s/e, so she needs derm eval to sort out the real problem

## 2013-12-17 NOTE — Telephone Encounter (Signed)
Noted and patient has an appointment with derm in early August

## 2013-12-17 NOTE — Telephone Encounter (Signed)
Called and left detailed message notifying patient of rx.

## 2013-12-17 NOTE — Telephone Encounter (Signed)
Please advise if this is indicated.

## 2013-12-28 ENCOUNTER — Other Ambulatory Visit: Payer: Self-pay

## 2013-12-28 MED ORDER — HYDROCODONE-ACETAMINOPHEN 7.5-325 MG PO TABS: ORAL_TABLET | ORAL | Status: DC

## 2014-01-14 ENCOUNTER — Telehealth: Payer: Self-pay | Admitting: *Deleted

## 2014-01-14 MED ORDER — PHENTERMINE HCL 37.5 MG PO TABS: ORAL_TABLET | ORAL | Status: DC

## 2014-01-14 NOTE — Telephone Encounter (Signed)
Printed to be signed then will fax

## 2014-01-14 NOTE — Telephone Encounter (Signed)
Pt called wanting a RX on her weight loss medication.

## 2014-01-20 ENCOUNTER — Other Ambulatory Visit: Payer: Self-pay

## 2014-01-20 MED ORDER — HYDROCODONE-ACETAMINOPHEN 7.5-325 MG PO TABS: ORAL_TABLET | ORAL | Status: DC

## 2014-02-02 ENCOUNTER — Encounter: Payer: Self-pay | Admitting: Family Medicine

## 2014-02-02 ENCOUNTER — Ambulatory Visit (INDEPENDENT_AMBULATORY_CARE_PROVIDER_SITE_OTHER): Payer: PRIVATE HEALTH INSURANCE | Admitting: Family Medicine

## 2014-02-02 ENCOUNTER — Other Ambulatory Visit: Payer: Self-pay | Admitting: Family Medicine

## 2014-02-02 ENCOUNTER — Encounter (INDEPENDENT_AMBULATORY_CARE_PROVIDER_SITE_OTHER): Payer: Self-pay

## 2014-02-02 VITALS — BP 124/62 | HR 78 | Resp 18 | Ht 64.0 in | Wt 196.0 lb

## 2014-02-02 DIAGNOSIS — R5381 Other malaise: Secondary | ICD-10-CM

## 2014-02-02 DIAGNOSIS — R609 Edema, unspecified: Secondary | ICD-10-CM

## 2014-02-02 DIAGNOSIS — M25562 Pain in left knee: Secondary | ICD-10-CM

## 2014-02-02 DIAGNOSIS — M79675 Pain in left toe(s): Secondary | ICD-10-CM

## 2014-02-02 DIAGNOSIS — E669 Obesity, unspecified: Secondary | ICD-10-CM

## 2014-02-02 DIAGNOSIS — M25569 Pain in unspecified knee: Secondary | ICD-10-CM

## 2014-02-02 DIAGNOSIS — N951 Menopausal and female climacteric states: Secondary | ICD-10-CM

## 2014-02-02 DIAGNOSIS — Z13220 Encounter for screening for lipoid disorders: Secondary | ICD-10-CM

## 2014-02-02 DIAGNOSIS — R232 Flushing: Secondary | ICD-10-CM | POA: Insufficient documentation

## 2014-02-02 DIAGNOSIS — Z23 Encounter for immunization: Secondary | ICD-10-CM

## 2014-02-02 DIAGNOSIS — M79609 Pain in unspecified limb: Secondary | ICD-10-CM

## 2014-02-02 DIAGNOSIS — R5383 Other fatigue: Secondary | ICD-10-CM

## 2014-02-02 LAB — BASIC METABOLIC PANEL
BUN: 10 mg/dL (ref 6–23)
CHLORIDE: 104 meq/L (ref 96–112)
CO2: 27 meq/L (ref 19–32)
CREATININE: 0.67 mg/dL (ref 0.50–1.10)
Calcium: 9.8 mg/dL (ref 8.4–10.5)
Glucose, Bld: 95 mg/dL (ref 70–99)
Potassium: 4.3 mEq/L (ref 3.5–5.3)
Sodium: 141 mEq/L (ref 135–145)

## 2014-02-02 LAB — LIPID PANEL
Cholesterol: 175 mg/dL (ref 0–200)
HDL: 74 mg/dL (ref >39)
LDL Cholesterol: 80 mg/dL (ref 0–99)
TRIGLYCERIDES: 103 mg/dL (ref <150)
Total CHOL/HDL Ratio: 2.4 Ratio
VLDL: 21 mg/dL (ref 0–40)

## 2014-02-02 MED ORDER — PHENTERMINE HCL 37.5 MG PO TABS: ORAL_TABLET | ORAL | Status: DC

## 2014-02-02 MED ORDER — VENLAFAXINE HCL ER 37.5 MG PO CP24: 37.5000 mg | ORAL_CAPSULE | Freq: Every day | ORAL | Status: DC

## 2014-02-02 MED ORDER — KETOROLAC TROMETHAMINE 60 MG/2ML IM SOLN
60.0000 mg | Freq: Once | INTRAMUSCULAR | Status: AC
  Administered 2014-02-02: 60 mg via INTRAMUSCULAR

## 2014-02-02 MED ORDER — PREDNISONE 5 MG PO TABS: 5.0000 mg | ORAL_TABLET | Freq: Two times a day (BID) | ORAL | Status: AC

## 2014-02-02 MED ORDER — FUROSEMIDE 20 MG PO TABS: ORAL_TABLET | ORAL | Status: DC

## 2014-02-02 MED ORDER — METHYLPREDNISOLONE ACETATE 80 MG/ML IJ SUSP
80.0000 mg | Freq: Once | INTRAMUSCULAR | Status: AC
  Administered 2014-02-02: 80 mg via INTRAMUSCULAR

## 2014-02-02 NOTE — Patient Instructions (Addendum)
F/u in 3 month, call if yopu need me before  Injections today for left knee pain, also prednisone sent in for 5 days  Additional labs added are to eval for perimenopause, FSH, LH also HIV and thyroid function test   New med for hot flashes effexor  One every morning  I recommend intervention on left knee since buckling , injections into knee or surgery'   Xray of left 5th toe is ordered to evaluate pain  Take lasix and potrassium daily as needed for swelling  Three more months of phentermine for weight loss  TdAP and flu vaccine today

## 2014-02-04 LAB — FSH/LH
FSH: 77.8 m[IU]/mL
LH: 38.6 m[IU]/mL

## 2014-02-04 LAB — TSH: TSH: 0.482 u[IU]/mL (ref 0.350–4.500)

## 2014-02-04 LAB — HIV ANTIBODY (ROUTINE TESTING W REFLEX): HIV 1&2 Ab, 4th Generation: NONREACTIVE

## 2014-02-06 DIAGNOSIS — Z23 Encounter for immunization: Secondary | ICD-10-CM | POA: Insufficient documentation

## 2014-02-06 NOTE — Assessment & Plan Note (Signed)
Vaccine administered at visit.  

## 2014-02-06 NOTE — Assessment & Plan Note (Signed)
Unchanged, pt needs to actually lose weight in next several months to continue phentermine. Patient re-educated about  the importance of commitment to a  minimum of 150 minutes of exercise per week. The importance of healthy food choices with portion control discussed. Encouraged to start a food diary, count calories and to consider  joining a support group. Sample diet sheets offered. Goals set by the patient for the next several months.

## 2014-02-06 NOTE — Assessment & Plan Note (Signed)
Lasix and potassium to be used on an as needed basis

## 2014-02-06 NOTE — Assessment & Plan Note (Signed)
increased pain and buckling in last 3 to 4 months. Needs intervention but resistant at this time Injection in office and then oral prednisone Weight loss encouraged also

## 2014-02-06 NOTE — Assessment & Plan Note (Signed)
Increased and uncontrolled Test hormone levels pe rpt request, however by history she is perimenopausal Start effexor, behavioral changes to facilitate symptom control also discussed
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
## 2014-02-22 ENCOUNTER — Other Ambulatory Visit: Payer: Self-pay | Admitting: Family Medicine

## 2014-03-02 ENCOUNTER — Other Ambulatory Visit: Payer: Self-pay

## 2014-03-02 MED ORDER — HYDROCODONE-ACETAMINOPHEN 7.5-325 MG PO TABS: ORAL_TABLET | ORAL | Status: DC

## 2014-03-16 ENCOUNTER — Other Ambulatory Visit: Payer: Self-pay

## 2014-03-16 DIAGNOSIS — R609 Edema, unspecified: Secondary | ICD-10-CM

## 2014-03-16 MED ORDER — POTASSIUM CHLORIDE CRYS ER 20 MEQ PO TBCR: EXTENDED_RELEASE_TABLET | ORAL | Status: DC

## 2014-03-29 ENCOUNTER — Telehealth: Payer: Self-pay | Admitting: *Deleted

## 2014-03-29 NOTE — Telephone Encounter (Signed)
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 Pt is taking predazone for her knee pain and motrin for her headaches pt states she wants to speak with a nurse about taking those 2 together pt said her knee has been giving he a lot of pain but not today so she took the motrin for her headache. Please advise [phone] pt stated she also needs a refill on these.

## 2014-03-30 ENCOUNTER — Other Ambulatory Visit: Payer: Self-pay | Admitting: Family Medicine

## 2014-03-30 NOTE — Telephone Encounter (Signed)
Called and left voicemail clarifying patient's concern.

## 2014-04-05 ENCOUNTER — Encounter: Payer: Self-pay | Admitting: Family Medicine

## 2014-04-05 ENCOUNTER — Ambulatory Visit (INDEPENDENT_AMBULATORY_CARE_PROVIDER_SITE_OTHER): Payer: PRIVATE HEALTH INSURANCE | Admitting: Family Medicine

## 2014-04-05 ENCOUNTER — Encounter (INDEPENDENT_AMBULATORY_CARE_PROVIDER_SITE_OTHER): Payer: Self-pay

## 2014-04-05 VITALS — BP 110/80 | HR 101 | Temp 99.1°F | Resp 16 | Ht 64.0 in | Wt 193.0 lb

## 2014-04-05 DIAGNOSIS — J0100 Acute maxillary sinusitis, unspecified: Secondary | ICD-10-CM | POA: Insufficient documentation

## 2014-04-05 DIAGNOSIS — R52 Pain, unspecified: Secondary | ICD-10-CM

## 2014-04-05 DIAGNOSIS — J0191 Acute recurrent sinusitis, unspecified: Secondary | ICD-10-CM

## 2014-04-05 DIAGNOSIS — B029 Zoster without complications: Secondary | ICD-10-CM

## 2014-04-05 DIAGNOSIS — K219 Gastro-esophageal reflux disease without esophagitis: Secondary | ICD-10-CM

## 2014-04-05 DIAGNOSIS — J0141 Acute recurrent pansinusitis: Secondary | ICD-10-CM

## 2014-04-05 DIAGNOSIS — F411 Generalized anxiety disorder: Secondary | ICD-10-CM

## 2014-04-05 MED ORDER — GABAPENTIN 100 MG PO CAPS: 100.0000 mg | ORAL_CAPSULE | Freq: Every day | ORAL | Status: DC

## 2014-04-05 MED ORDER — LEVOFLOXACIN 500 MG PO TABS: 500.0000 mg | ORAL_TABLET | Freq: Every day | ORAL | Status: DC

## 2014-04-05 MED ORDER — FLUCONAZOLE 150 MG PO TABS: ORAL_TABLET | ORAL | Status: DC

## 2014-04-05 MED ORDER — CEFTRIAXONE SODIUM 1 G IJ SOLR
500.0000 mg | Freq: Once | INTRAMUSCULAR | Status: AC
  Administered 2014-04-05: 500 mg via INTRAMUSCULAR

## 2014-04-05 MED ORDER — KETOROLAC TROMETHAMINE 60 MG/2ML IM SOLN
60.0000 mg | Freq: Once | INTRAMUSCULAR | Status: AC
  Administered 2014-04-05: 60 mg via INTRAMUSCULAR

## 2014-04-05 MED ORDER — HYDROXYZINE HCL 25 MG PO TABS: 25.0000 mg | ORAL_TABLET | Freq: Three times a day (TID) | ORAL | Status: DC | PRN

## 2014-04-05 NOTE — Assessment & Plan Note (Signed)
1 week h/o progressive headache, facial pressure, green bloody nasal drainage, chills and fever Rocephin in office , followed by levaquin

## 2014-04-05 NOTE — Progress Notes (Signed)
   Subjective:    Patient ID: Anna Cantu, female    DOB: 1965-09-11, 48 y.o.   MRN: [id number]  HPI 10 day h/o progressively worsening facial pressure, intermittent chills and fever, with ear pressure and headache. Nasal drainage is thick and green and  foul tasting thick p at times blood tinged. Denies sore throat or hearing loss. Increasing fatigue and poor appetite. C/o increased and uncontrolled generalized joint pains   Review of Systems See HPI C/o generalized headacheand , hoarseness Denies chest congestion, productive cough or wheezing. Denies chest pains, palpitations and leg swelling Denies abdominal pain, nausea, vomiting,diarrhea or constipation.   Denies dysuria, frequency, hesitancy or incontinence. Denies , seizures, numbness, or tingling. Denies depression, anxiety or insomnia. Denies skin break down or rash.        Objective:   Physical Exam BP 110/80 mmHg  Pulse 101  Temp(Src) 99.1 F (37.3 C) (Oral)  Resp 16  Ht 5\' 4"  (1.626 m)  Wt 193 lb (87.544 kg)  BMI 33.11 kg/m2  SpO2 99% Patient alert and oriented and in no cardiopulmonary distress.Ill appearing and in pain  HEENT: No facial asymmetry, EOMI,   oropharynx pink and moist.  Neck supple no JVD, no mass. Tender over frontal and maxillary sinuses, tM clear Chest: Clear to auscultation bilaterally.  CVS: S1, S2 no murmurs, no S3.Regular rate.  ABD: Soft non tender.   Ext: No edema  MS: decreased ROM spine, shoulders, hips and knees.  Skin: Intact, no ulcerations or rash noted.  Psych: Good eye contact, normal affect. Memory intact not anxious or depressed appearing.  CNS: CN 2-12 intact, power,  normal throughout.no focal deficits noted.        Assessment & Plan:  Acute sinusitis 1 week h/o progressive headache, facial pressure, green bloody nasal drainage, chills and fever Rocephin in office , followed by levaquin   Generalized pain tooradol 60mg  iM in office  today   Acute recurrent sinusitis Rocephin in office followed by levaquin   GERD (gastroesophageal reflux disease) Controlled, no change in management    Generalized anxiety disorder Ongoing problem, better controlled, she cares for her mentall ;;l mother who lives with her and who will not take medication and is in denial about her disease Overall she iis coping better than in the past    Generalized pain Increased and uncontrolled, toradol administered at visit

## 2014-04-05 NOTE — Patient Instructions (Signed)
Physical in January as before, pls call if you need me sooner  You are treated for acute sinusitis and generalized pain  Rocephin in office, take entire course of levaquin prescribed for infection  Toradol 60mg  in office for generalized pain  Work excuse from today to return on 04/07/2014

## 2014-04-05 NOTE — Assessment & Plan Note (Signed)
tooradol 60mg  iM in office today

## 2014-04-07 ENCOUNTER — Other Ambulatory Visit: Payer: Self-pay

## 2014-04-07 MED ORDER — HYDROCODONE-ACETAMINOPHEN 7.5-325 MG PO TABS: ORAL_TABLET | ORAL | Status: DC

## 2014-04-08 ENCOUNTER — Telehealth: Payer: Self-pay

## 2014-04-08 DIAGNOSIS — R51 Headache: Principal | ICD-10-CM

## 2014-04-08 DIAGNOSIS — Z8249 Family history of ischemic heart disease and other diseases of the circulatory system: Secondary | ICD-10-CM

## 2014-04-08 DIAGNOSIS — R519 Headache, unspecified: Secondary | ICD-10-CM

## 2014-04-08 DIAGNOSIS — G8929 Other chronic pain: Secondary | ICD-10-CM

## 2014-04-08 DIAGNOSIS — M542 Cervicalgia: Secondary | ICD-10-CM

## 2014-04-08 NOTE — Telephone Encounter (Signed)
I will order , pls let her know, pls verify where the aneurysms are in her family, is it intracranial, (in the brain) may need that more specific detail, just document in phone note please  So info will be available, thanks

## 2014-04-11 ENCOUNTER — Other Ambulatory Visit: Payer: Self-pay

## 2014-04-11 DIAGNOSIS — M542 Cervicalgia: Secondary | ICD-10-CM

## 2014-04-11 DIAGNOSIS — Z8249 Family history of ischemic heart disease and other diseases of the circulatory system: Secondary | ICD-10-CM

## 2014-04-13 ENCOUNTER — Other Ambulatory Visit: Payer: Self-pay | Admitting: Family Medicine

## 2014-04-14 ENCOUNTER — Telehealth: Payer: Self-pay | Admitting: Family Medicine

## 2014-04-14 ENCOUNTER — Other Ambulatory Visit: Payer: Self-pay | Admitting: Family Medicine

## 2014-04-14 ENCOUNTER — Other Ambulatory Visit (HOSPITAL_COMMUNITY): Payer: Self-pay | Admitting: Radiology

## 2014-04-14 DIAGNOSIS — M542 Cervicalgia: Secondary | ICD-10-CM

## 2014-04-14 DIAGNOSIS — Z8249 Family history of ischemic heart disease and other diseases of the circulatory system: Secondary | ICD-10-CM

## 2014-04-14 NOTE — Telephone Encounter (Signed)
Patient states that they are brain aneurysms.  She is not aware of any other information

## 2014-04-15 ENCOUNTER — Ambulatory Visit (HOSPITAL_COMMUNITY): Admission: RE | Admit: 2014-04-15 | Payer: PRIVATE HEALTH INSURANCE | Source: Ambulatory Visit

## 2014-04-15 ENCOUNTER — Ambulatory Visit (HOSPITAL_COMMUNITY): Payer: PRIVATE HEALTH INSURANCE

## 2014-04-18 ENCOUNTER — Emergency Department (HOSPITAL_COMMUNITY)
Admission: EM | Admit: 2014-04-18 | Discharge: 2014-04-18 | Disposition: A | Discharge status: Home / Self Care | Payer: No Typology Code available for payment source | Attending: Emergency Medicine | Admitting: Emergency Medicine

## 2014-04-18 ENCOUNTER — Telehealth: Payer: Self-pay

## 2014-04-18 ENCOUNTER — Emergency Department (HOSPITAL_COMMUNITY): Payer: No Typology Code available for payment source

## 2014-04-18 ENCOUNTER — Ambulatory Visit (HOSPITAL_COMMUNITY)
Admission: RE | Admit: 2014-04-18 | Discharge: 2014-04-18 | Disposition: A | Discharge status: Home / Self Care | Payer: No Typology Code available for payment source | Source: Ambulatory Visit | Attending: Family Medicine | Admitting: Family Medicine

## 2014-04-18 ENCOUNTER — Encounter (HOSPITAL_COMMUNITY): Payer: Self-pay

## 2014-04-18 DIAGNOSIS — Y9389 Activity, other specified: Secondary | ICD-10-CM | POA: Insufficient documentation

## 2014-04-18 DIAGNOSIS — Z87891 Personal history of nicotine dependence: Secondary | ICD-10-CM | POA: Diagnosis not present

## 2014-04-18 DIAGNOSIS — Z8719 Personal history of other diseases of the digestive system: Secondary | ICD-10-CM | POA: Diagnosis not present

## 2014-04-18 DIAGNOSIS — M542 Cervicalgia: Secondary | ICD-10-CM

## 2014-04-18 DIAGNOSIS — Y998 Other external cause status: Secondary | ICD-10-CM | POA: Insufficient documentation

## 2014-04-18 DIAGNOSIS — R9089 Other abnormal findings on diagnostic imaging of central nervous system: Secondary | ICD-10-CM | POA: Diagnosis not present

## 2014-04-18 DIAGNOSIS — R51 Headache: Secondary | ICD-10-CM | POA: Diagnosis present

## 2014-04-18 DIAGNOSIS — Y92410 Unspecified street and highway as the place of occurrence of the external cause: Secondary | ICD-10-CM | POA: Insufficient documentation

## 2014-04-18 DIAGNOSIS — E669 Obesity, unspecified: Secondary | ICD-10-CM | POA: Insufficient documentation

## 2014-04-18 DIAGNOSIS — S199XXA Unspecified injury of neck, initial encounter: Secondary | ICD-10-CM | POA: Insufficient documentation

## 2014-04-18 DIAGNOSIS — Z79899 Other long term (current) drug therapy: Secondary | ICD-10-CM | POA: Insufficient documentation

## 2014-04-18 DIAGNOSIS — Z8489 Family history of other specified conditions: Secondary | ICD-10-CM | POA: Insufficient documentation

## 2014-04-18 DIAGNOSIS — Z8742 Personal history of other diseases of the female genital tract: Secondary | ICD-10-CM | POA: Insufficient documentation

## 2014-04-18 DIAGNOSIS — Z791 Long term (current) use of non-steroidal anti-inflammatories (NSAID): Secondary | ICD-10-CM | POA: Insufficient documentation

## 2014-04-18 DIAGNOSIS — Z8249 Family history of ischemic heart disease and other diseases of the circulatory system: Secondary | ICD-10-CM

## 2014-04-18 DIAGNOSIS — G8929 Other chronic pain: Secondary | ICD-10-CM | POA: Insufficient documentation

## 2014-04-18 MED ORDER — OXYCODONE-ACETAMINOPHEN 5-325 MG PO TABS: 2.0000 | ORAL_TABLET | ORAL | Status: DC | PRN

## 2014-04-18 MED ORDER — CYCLOBENZAPRINE HCL 10 MG PO TABS: 10.0000 mg | ORAL_TABLET | Freq: Two times a day (BID) | ORAL | Status: DC | PRN

## 2014-04-18 MED ORDER — HYDROMORPHONE HCL 1 MG/ML IJ SOLN
1.0000 mg | Freq: Once | INTRAMUSCULAR | Status: AC
  Administered 2014-04-18: 1 mg via INTRAMUSCULAR
  Filled 2014-04-18: qty 1
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
## 2014-04-18 NOTE — Discharge Instructions (Signed)
X-ray of neck showed no acute problems. Prescription for pain medicine and muscle relaxer. I spoke with the MRI tech. He recommended that you go to the outpatient radiology department and see if they can fit you in now

## 2014-04-18 NOTE — Telephone Encounter (Signed)
Recent x ray done

## 2014-04-18 NOTE — Telephone Encounter (Signed)
Patient seen in ED. 
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
## 2014-04-20 ENCOUNTER — Other Ambulatory Visit: Payer: Self-pay | Admitting: Family Medicine

## 2014-04-20 ENCOUNTER — Telehealth: Payer: Self-pay

## 2014-04-20 DIAGNOSIS — R51 Headache: Secondary | ICD-10-CM

## 2014-04-20 DIAGNOSIS — Z8249 Family history of ischemic heart disease and other diseases of the circulatory system: Secondary | ICD-10-CM

## 2014-04-20 DIAGNOSIS — R519 Headache, unspecified: Secondary | ICD-10-CM

## 2014-04-20 DIAGNOSIS — I671 Cerebral aneurysm, nonruptured: Secondary | ICD-10-CM
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
## 2014-04-23 ENCOUNTER — Other Ambulatory Visit: Payer: Self-pay | Admitting: Family Medicine

## 2014-04-27 ENCOUNTER — Telehealth: Payer: Self-pay | Admitting: Family Medicine

## 2014-04-28 ENCOUNTER — Other Ambulatory Visit: Payer: Self-pay | Admitting: Family Medicine

## 2014-05-03 ENCOUNTER — Telehealth: Payer: Self-pay | Admitting: Family Medicine

## 2014-05-03 MED ORDER — IBUPROFEN 800 MG PO TABS: ORAL_TABLET | ORAL | Status: DC

## 2014-05-03 NOTE — Telephone Encounter (Signed)
Med sent to last 3 months and note was sent to pharmacy

## 2014-05-03 NOTE — Telephone Encounter (Signed)
Patient states that she is no longer having an problems with cramping.

## 2014-05-03 NOTE — Telephone Encounter (Signed)
Letter composed.

## 2014-05-06 ENCOUNTER — Telehealth: Payer: Self-pay | Admitting: Family Medicine

## 2014-05-09 NOTE — Telephone Encounter (Signed)
Noted.    Papers awaiting review by md

## 2014-05-13 ENCOUNTER — Other Ambulatory Visit: Payer: Self-pay

## 2014-05-13 MED ORDER — HYDROCODONE-ACETAMINOPHEN 7.5-325 MG PO TABS: ORAL_TABLET | ORAL | Status: DC

## 2014-05-25 ENCOUNTER — Telehealth: Payer: Self-pay

## 2014-05-25 MED ORDER — ONDANSETRON HCL 4 MG PO TABS: 4.0000 mg | ORAL_TABLET | Freq: Every day | ORAL | Status: DC

## 2014-05-25 NOTE — Telephone Encounter (Signed)
Vomiting Yes.      Recommended treatment Hydration is important Fluids small frequent amounts as tolerated Good hygiene reduces transmission among family members Review Brat diet  Zofran 4 mg 1 tablet daily as needed for nausea and vomiting no more than 6 tablets   DiarrheaNo.  Recommended treatment  Imodium OTC  Can also offer Lomotil 1 tablet 4 times daily as needed no more than 10 tablets Good hygiene reduces transmission among family members Review Brat Diet  If patient starts to feel light headed or not passing much urine or becoming dehydrated will need to go to emergency room for IV hydration  Please call office if symptoms worsen or do not improve after 2-3 days  PATIENT WOKE UP THIS AM VERY NAUSEATED AND STARTED TO VOMIT. REPORTS 10-20 EPISODES. NO DIARRHEA. HAS BEEN SIPPING GINGER ALE. ADVISED THAT I WOULD SEND IN ZOFRAN BUT IF IT WASN'T BETTER BY THIS EVENING OR SHE STARTED TO FEEL LIGHT HEADED THEN SHE NEEDED TO GO TO THE ED FOR EVALUATION. WILL SEND IN 6 TABS PER PROTOCOL.

## 2014-06-02 ENCOUNTER — Telehealth: Payer: Self-pay

## 2014-06-02 MED ORDER — AZITHROMYCIN 250 MG PO TABS: ORAL_TABLET | ORAL | Status: DC

## 2014-06-02 NOTE — Telephone Encounter (Signed)
Called and left message notifying patient of rx

## 2014-06-02 NOTE — Addendum Note (Signed)
Addended by: Denman George B on: 06/02/2014 09:40 AM   Modules accepted: Orders

## 2014-06-02 NOTE — Telephone Encounter (Signed)
pls send a z pack x 1 to pharmacy let her know, also needs to ensure adequate fluid intake, if symptom worsen before viisit call back

## 2014-06-09 ENCOUNTER — Encounter: Payer: Self-pay | Admitting: Family Medicine

## 2014-06-09 ENCOUNTER — Encounter: Payer: Self-pay | Admitting: Internal Medicine

## 2014-06-09 ENCOUNTER — Other Ambulatory Visit (HOSPITAL_COMMUNITY)
Admission: RE | Admit: 2014-06-09 | Discharge: 2014-06-09 | Disposition: A | Discharge status: Home / Self Care | Payer: Managed Care, Other (non HMO) | Source: Ambulatory Visit | Attending: Family Medicine | Admitting: Family Medicine

## 2014-06-09 ENCOUNTER — Ambulatory Visit (INDEPENDENT_AMBULATORY_CARE_PROVIDER_SITE_OTHER): Payer: Managed Care, Other (non HMO) | Admitting: Family Medicine

## 2014-06-09 VITALS — BP 118/78 | HR 81 | Resp 16 | Ht 63.0 in | Wt 200.0 lb

## 2014-06-09 DIAGNOSIS — Z0000 Encounter for general adult medical examination without abnormal findings: Secondary | ICD-10-CM

## 2014-06-09 DIAGNOSIS — Z113 Encounter for screening for infections with a predominantly sexual mode of transmission: Secondary | ICD-10-CM | POA: Diagnosis present

## 2014-06-09 DIAGNOSIS — R87810 Cervical high risk human papillomavirus (HPV) DNA test positive: Secondary | ICD-10-CM | POA: Diagnosis present

## 2014-06-09 DIAGNOSIS — R110 Nausea: Secondary | ICD-10-CM

## 2014-06-09 DIAGNOSIS — N760 Acute vaginitis: Secondary | ICD-10-CM | POA: Insufficient documentation

## 2014-06-09 DIAGNOSIS — Z01419 Encounter for gynecological examination (general) (routine) without abnormal findings: Secondary | ICD-10-CM | POA: Insufficient documentation

## 2014-06-09 DIAGNOSIS — Z1151 Encounter for screening for human papillomavirus (HPV): Secondary | ICD-10-CM | POA: Insufficient documentation

## 2014-06-09 DIAGNOSIS — E669 Obesity, unspecified: Secondary | ICD-10-CM

## 2014-06-09 DIAGNOSIS — N3090 Cystitis, unspecified without hematuria: Secondary | ICD-10-CM

## 2014-06-09 DIAGNOSIS — K219 Gastro-esophageal reflux disease without esophagitis: Secondary | ICD-10-CM

## 2014-06-09 DIAGNOSIS — Z124 Encounter for screening for malignant neoplasm of cervix: Secondary | ICD-10-CM

## 2014-06-09 DIAGNOSIS — Z1211 Encounter for screening for malignant neoplasm of colon: Secondary | ICD-10-CM

## 2014-06-09 LAB — POC HEMOCCULT BLD/STL (OFFICE/1-CARD/DIAGNOSTIC): FECAL OCCULT BLD: NEGATIVE

## 2014-06-09 LAB — POCT URINALYSIS DIPSTICK
BILIRUBIN UA: NEGATIVE
Glucose, UA: NEGATIVE
Ketones, UA: NEGATIVE
LEUKOCYTES UA: NEGATIVE
NITRITE UA: NEGATIVE
PROTEIN UA: NEGATIVE
RBC UA: NEGATIVE
Spec Grav, UA: 1.02
Urobilinogen, UA: 0.2
pH, UA: 7

## 2014-06-09 NOTE — Patient Instructions (Addendum)
F/u in 4 month, call if you need  Me before  Commit to exercise for 30 min every day  Portion control, is AS IMPORTANT as food choice for weight loss  !200 and 1500 cal sheets provided  Weight loss goal of 10 to 12 poundsin next 4 n month  You are referred to nutritionist for help  You are referred to dr Gala Romney for chronic nausea, all the best for 2016!

## 2014-06-09 NOTE — Assessment & Plan Note (Signed)
Chronic and uncontrolled nausea and abdominal pain, GI to evaluate

## 2014-06-09 NOTE — Assessment & Plan Note (Signed)
Chronic abdominal pain and nausea , uncontrolled, refer to gI for furhter eval

## 2014-06-09 NOTE — Assessment & Plan Note (Signed)

## 2014-06-09 NOTE — Assessment & Plan Note (Signed)
CCIUA in office is normal, pt reassured of no infection

## 2014-06-09 NOTE — Assessment & Plan Note (Signed)
Deteriorated. Patient re-educated about  the importance of commitment to a  minimum of 150 minutes of exercise per week. The importance of healthy food choices with portion control discussed. Encouraged to start a food diary, count calories and to consider  joining a support group. Sample diet sheets offered. Goals set by the patient for the next several months.    

## 2014-06-09 NOTE — Progress Notes (Signed)
Subjective:    Patient ID: Anna Cantu, female    DOB: 1965-06-24, 49 y.o.   MRN: [id number]  HPI Patient is in for annual physical exam. C/o urinary frequency and burning, also c/o smelly , itchy vagial discharge. C/o chronic abdominal pain, epigastric and RUQ C/o needing to lose weight, wants diet sheet and nutrition referral  Review of Systems See HPI     Objective:   Physical Exam BP 118/78 mmHg  Pulse 81  Resp 16  Ht 5\' 3"  (1.6 m)  Wt 200 lb (90.719 kg)  BMI 35.44 kg/m2  SpO2 99%  LMP 07/01/2013 Pleasant  female, alert and oriented x 3, in no cardio-pulmonary distress. Afebrile. HEENT No facial trauma or asymetry. Sinuses non tender.  Extra occullar muscles intact, pupils equally reactive to light. External ears normal, tympanic membranes clear. Oropharynx moist, no exudate, good dentition. Neck: supple, no adenopathy,JVD or thyromegaly.No bruits.  Chest: Clear to ascultation bilaterally.No crackles or wheezes. Non tender to palpation  Breast: No asymetry,no masses or lumps. No tenderness. No nipple discharge or inversion. No axillary or supraclavicular adenopathy  Cardiovascular system; Heart sounds normal,  S1 and  S2 ,no S3.  No murmur, or thrill. Apical beat not displaced Peripheral pulses normal.  Abdomen: Soft, non tender, no organomegaly or masses. No bruits. Bowel sounds normal. No guarding, tenderness or rebound.  Rectal:  Normal sphincter tone. No mass.No rectal masses.  Guaiac negative stool.  GU: External genitalia normal female genitalia , female distribution of hair. No lesions. Urethral meatus normal in size, no  Prolapse, no lesions visibly  Present. Bladder non tender. Vagina pink and moist , with no visible lesions , white physiologic appearing  discharge present . Adequate pelvic support no  cystocele or rectocele noted Cervix pink and appears healthy, no lesions or ulcerations noted, no discharge noted from  os Uterus normal size, no adnexal masses, no cervical motion or adnexal tenderness.   Musculoskeletal exam: Full ROM of spine, hips , shoulders and knees. No deformity ,swelling or crepitus noted. No muscle wasting or atrophy.   Neurologic: Cranial nerves 2 to 12 intact. Power, tone ,sensation and reflexes normal throughout. No disturbance in gait. No tremor.  Skin: Intact, no ulceration, erythema , scaling or rash noted. Pigmentation normal throughout  Psych; Normal mood and affect. Judgement and concentration normal        Assessment & Plan:  Annual physical exam Annual exam as documented. Counseling done  re healthy lifestyle involving commitment to 150 minutes exercise per week, heart healthy diet, and attaining healthy weight.The importance of adequate sleep also discussed. Regular seat belt use and home safety, is also discussed. Changes in health habits are decided on by the patient with goals and time frames  set for achieving them. Immunization and cancer screening needs are specifically addressed at this visit.    GERD (gastroesophageal reflux disease) Chronic abdominal pain and nausea , uncontrolled, refer to gI for furhter eval   Obesity (BMI 35.0-39.9 without comorbidity) Deteriorated. Patient re-educated about  the importance of commitment to a  minimum of 150 minutes of exercise per week. The importance of healthy food choices with portion control discussed. Encouraged to start a food diary, count calories and to consider  joining a support group. Sample diet sheets offered. Goals set by the patient for the next several months.      Nausea without vomiting Chronic and uncontrolled nausea and abdominal pain, GI to evaluate   Cystitis CCIUA in office is  normal, pt reassured of no infection   Vaginitis and vulvovaginitis Specimens sent for testing

## 2014-06-09 NOTE — Assessment & Plan Note (Signed)
Specimens sent for testing

## 2014-06-10 LAB — CYTOLOGY - PAP

## 2014-06-10 LAB — CERVICOVAGINAL ANCILLARY ONLY
Wet Prep (BD Affirm): NEGATIVE
Wet Prep (BD Affirm): NEGATIVE
Wet Prep (BD Affirm): POSITIVE — AB

## 2014-06-13 ENCOUNTER — Encounter: Payer: Self-pay | Admitting: Family Medicine

## 2014-06-13 ENCOUNTER — Telehealth: Payer: Self-pay | Admitting: Family Medicine

## 2014-06-13 DIAGNOSIS — R87810 Cervical high risk human papillomavirus (HPV) DNA test positive: Secondary | ICD-10-CM | POA: Insufficient documentation

## 2014-06-13 MED ORDER — FLUCONAZOLE 150 MG PO TABS: 150.0000 mg | ORAL_TABLET | Freq: Once | ORAL | Status: DC

## 2014-06-13 MED ORDER — METRONIDAZOLE 500 MG PO TABS: 500.0000 mg | ORAL_TABLET | Freq: Two times a day (BID) | ORAL | Status: DC

## 2014-06-13 NOTE — Telephone Encounter (Signed)
Message left and med sent  

## 2014-06-18 DIAGNOSIS — R519 Headache, unspecified: Secondary | ICD-10-CM | POA: Insufficient documentation

## 2014-06-18 DIAGNOSIS — R0789 Other chest pain: Secondary | ICD-10-CM | POA: Insufficient documentation

## 2014-06-18 DIAGNOSIS — R51 Headache: Secondary | ICD-10-CM

## 2014-06-18 NOTE — Assessment & Plan Note (Signed)
Controlled, no change in medication  

## 2014-06-18 NOTE — Progress Notes (Signed)
   Subjective:    Patient ID: ODESSER TOURANGEAU, female    DOB: 1966-05-18, 49 y.o.   MRN: [id number]  HPI The PT is here for follow up and re-evaluation of chronic medical conditions, medication management and review of any available recent lab and radiology data.  Preventive health is updated, specifically  Cancer screening and Immunization.   Questions or concerns regarding consultations or procedures which the PT has had in the interim are  addressed. The PT denies any adverse reactions to current medications since the last visit.   \c/o increased generalized pain, chest and back, chest pain not aggraveted by movement but by direct pressure, no associated light headedness or radiaition   Review of Systems    See HPI Denies recent fever or chills. Denies sinus pressure, nasal congestion, ear pain or sore throat. Denies chest congestion, productive cough or wheezing. Denies chest pains, palpitations and leg swelling Denies abdominal pain, nausea, vomiting,diarrhea or constipation.   Denies dysuria, frequency, hesitancy or incontinence. . Denies depression, anxiety or insomnia. Denies skin break down or rash.     Objective:   Physical Exam BP 118/72 mmHg  Pulse 84  Resp 16  Ht 5\' 4"  (1.626 m)  Wt 193 lb 1.9 oz (87.599 kg)  BMI 33.13 kg/m2  SpO2 98%  Patient alert and oriented and in no cardiopulmonary distress.Pt in apin Patient alert and oriented and in no cardiopulmonary distress.  HEENT: No facial asymmetry, EOMI,   oropharynx pink and moist.  Neck supple no JVD, no mass.  Chest: Clear to auscultation bilaterally.Reproducible anterior chest wall at CC junctions 4 and 5 on left  CVS: S1, S2 no murmurs, no S3.Regular rate.  ABD: Soft non tender.   Ext: No edema  MS: decreased  ROM lumbar  Spine, adequate in  shoulders, hips and knees.  Skin: Intact, no ulcerations or rash noted.  Psych: Good eye contact, normal affect. Memory intact not anxious or  depressed appearing.  CNS: CN 2-12 intact, power,  normal throughout.no focal deficits noted.         Assessment & Plan:  Atypical chest pain reproducible chest wall pain  Office EKG : NSR with no ischemic changes   Backache Uncontrolled, tporadol and depo medrol in office   GERD (gastroesophageal reflux disease) Controlled, no change in medication    Generalized anxiety disorder Controlled, no change in medication    Headache Controlled, no change in medication

## 2014-06-18 NOTE — Assessment & Plan Note (Signed)
reproducible chest wall pain  Office EKG : NSR with no ischemic changes

## 2014-06-18 NOTE — Assessment & Plan Note (Signed)
Uncontrolled, tporadol and depo medrol in office

## 2014-06-23 ENCOUNTER — Other Ambulatory Visit: Payer: Self-pay

## 2014-06-23 MED ORDER — HYDROCODONE-ACETAMINOPHEN 7.5-325 MG PO TABS: ORAL_TABLET | ORAL | Status: DC

## 2014-07-08 ENCOUNTER — Encounter: Payer: Self-pay | Admitting: Gastroenterology

## 2014-07-08 ENCOUNTER — Ambulatory Visit: Payer: Managed Care, Other (non HMO) | Admitting: Gastroenterology

## 2014-07-08 ENCOUNTER — Telehealth: Payer: Self-pay

## 2014-07-08 MED ORDER — CYCLOBENZAPRINE HCL 10 MG PO TABS: ORAL_TABLET | ORAL | Status: DC

## 2014-07-08 MED ORDER — PREDNISONE 5 MG PO TABS: 5.0000 mg | ORAL_TABLET | Freq: Two times a day (BID) | ORAL | Status: AC

## 2014-07-08 NOTE — Telephone Encounter (Signed)
Flexeril prescribed and 5 day course of prednisone also

## 2014-07-08 NOTE — Telephone Encounter (Signed)
States she is having trouble with her knees swelling and hurting again. Took a flexeril she had from 2013 and it worked so she wants a new rx temp supply sent in and prednisone is you think its appropriate.  Wm reids

## 2014-07-08 NOTE — Telephone Encounter (Signed)
Pt aware.

## 2014-07-14 ENCOUNTER — Telehealth: Payer: Self-pay

## 2014-07-14 ENCOUNTER — Telehealth: Payer: Self-pay | Admitting: Family Medicine

## 2014-07-14 NOTE — Telephone Encounter (Signed)
Pt aware.

## 2014-07-14 NOTE — Telephone Encounter (Signed)
Ok to give topradol 60mg  IM and depo medrol 80mg  iM for acute ocn chronic back pain, pls document duration of symptom flare and rate the pain in the nurse visit, and location and radiation , thanks

## 2014-07-14 NOTE — Telephone Encounter (Signed)
Called and left message for patient to return call and confirm that she will come in

## 2014-07-17 DIAGNOSIS — J0191 Acute recurrent sinusitis, unspecified: Secondary | ICD-10-CM | POA: Insufficient documentation

## 2014-07-17 DIAGNOSIS — R52 Pain, unspecified: Secondary | ICD-10-CM | POA: Insufficient documentation

## 2014-07-17 NOTE — Assessment & Plan Note (Signed)
Controlled , no change in management 

## 2014-07-17 NOTE — Assessment & Plan Note (Signed)
Rocephin in office followed by levaquin

## 2014-07-17 NOTE — Assessment & Plan Note (Addendum)
Ongoing problem, better controlled, she cares for her mentall ;;l mother who lives with her and who will not take medication and is in denial about her disease Overall she iis coping better than in the past

## 2014-07-17 NOTE — Assessment & Plan Note (Signed)
Increased and uncontrolled, toradol administered at visit

## 2014-07-18 ENCOUNTER — Ambulatory Visit (INDEPENDENT_AMBULATORY_CARE_PROVIDER_SITE_OTHER): Payer: Managed Care, Other (non HMO)

## 2014-07-18 ENCOUNTER — Telehealth: Payer: Self-pay

## 2014-07-18 DIAGNOSIS — R52 Pain, unspecified: Secondary | ICD-10-CM

## 2014-07-18 DIAGNOSIS — Z23 Encounter for immunization: Secondary | ICD-10-CM

## 2014-07-18 DIAGNOSIS — M5440 Lumbago with sciatica, unspecified side: Secondary | ICD-10-CM

## 2014-07-18 MED ORDER — KETOROLAC TROMETHAMINE 60 MG/2ML IM SOLN
60.0000 mg | Freq: Once | INTRAMUSCULAR | Status: AC
  Administered 2014-07-18: 60 mg via INTRAMUSCULAR

## 2014-07-18 MED ORDER — METHYLPREDNISOLONE ACETATE 80 MG/ML IJ SUSP
80.0000 mg | Freq: Once | INTRAMUSCULAR | Status: AC
  Administered 2014-07-18: 80 mg via INTRAMUSCULAR

## 2014-07-18 NOTE — Progress Notes (Signed)
Patient in for injections for acute pain.  Injections given as ordered.  Patient states that she has been having a flare of pain x 1 week.  Pain is in right shoulder, left knee, and back.  She has been applying heat, ice and doing stretches to help alleviate pain.  No voiced complaints from injections.

## 2014-07-18 NOTE — Telephone Encounter (Signed)
Patient coming in at 10am

## 2014-07-19 NOTE — Telephone Encounter (Signed)
Patient in for injections.

## 2014-08-05 ENCOUNTER — Other Ambulatory Visit: Payer: Self-pay

## 2014-08-05 MED ORDER — HYDROCODONE-ACETAMINOPHEN 7.5-325 MG PO TABS: ORAL_TABLET | ORAL | Status: DC

## 2014-09-05 ENCOUNTER — Other Ambulatory Visit: Payer: Self-pay

## 2014-09-05 MED ORDER — FLUCONAZOLE 150 MG PO TABS: 150.0000 mg | ORAL_TABLET | Freq: Once | ORAL | Status: DC

## 2014-09-06 ENCOUNTER — Telehealth: Payer: Self-pay | Admitting: Family Medicine

## 2014-09-06 NOTE — Telephone Encounter (Signed)
Called patient and left message for them to return call at the office   

## 2014-09-07 ENCOUNTER — Telehealth: Payer: Self-pay | Admitting: Family Medicine

## 2014-09-07 ENCOUNTER — Other Ambulatory Visit: Payer: Self-pay

## 2014-09-07 MED ORDER — METRONIDAZOLE 500 MG PO TABS: 500.0000 mg | ORAL_TABLET | Freq: Two times a day (BID) | ORAL | Status: DC

## 2014-09-07 NOTE — Telephone Encounter (Signed)
Patient c/o vaginitis. Would like to know if she can be treated for bv or does she need to come in for an office visit.

## 2014-09-07 NOTE — Telephone Encounter (Signed)
[  pls  Send in 1 course, explain unless she takes entire course infection not treated and she will become resistant

## 2014-09-07 NOTE — Telephone Encounter (Signed)
See previous message

## 2014-09-07 NOTE — Telephone Encounter (Signed)
Patient aware and will complete abt course.  Patient ed sent on antibiotic resistance.

## 2014-09-08 ENCOUNTER — Other Ambulatory Visit: Payer: Self-pay

## 2014-09-08 MED ORDER — HYDROCODONE-ACETAMINOPHEN 7.5-325 MG PO TABS: ORAL_TABLET | ORAL | Status: DC

## 2014-09-19 ENCOUNTER — Telehealth: Payer: Self-pay | Admitting: Family Medicine

## 2014-09-20 NOTE — Telephone Encounter (Signed)
Do you recommend any otc supplements for joint health?

## 2014-09-20 NOTE — Telephone Encounter (Signed)
Some people get benefit from glocasimne OTC she may try that, but needs to consider ortho eval for injections in the joint which are at times beneficial (orthovisc)

## 2014-09-22 NOTE — Telephone Encounter (Signed)
Message left for patient

## 2014-09-23 ENCOUNTER — Telehealth: Payer: Self-pay | Admitting: Family Medicine

## 2014-09-23 NOTE — Telephone Encounter (Signed)
Message left on voicemail to try glucosamine

## 2014-10-11 ENCOUNTER — Other Ambulatory Visit (HOSPITAL_COMMUNITY)
Admission: RE | Admit: 2014-10-11 | Discharge: 2014-10-11 | Disposition: A | Discharge status: Home / Self Care | Payer: Managed Care, Other (non HMO) | Source: Ambulatory Visit | Attending: Family Medicine | Admitting: Family Medicine

## 2014-10-11 ENCOUNTER — Ambulatory Visit: Payer: Managed Care, Other (non HMO) | Admitting: Family Medicine

## 2014-10-11 ENCOUNTER — Ambulatory Visit (INDEPENDENT_AMBULATORY_CARE_PROVIDER_SITE_OTHER): Payer: Managed Care, Other (non HMO) | Admitting: Family Medicine

## 2014-10-11 VITALS — BP 106/68 | HR 80 | Resp 18 | Ht 63.0 in | Wt 198.1 lb

## 2014-10-11 DIAGNOSIS — N760 Acute vaginitis: Secondary | ICD-10-CM | POA: Insufficient documentation

## 2014-10-11 DIAGNOSIS — M25562 Pain in left knee: Secondary | ICD-10-CM

## 2014-10-11 DIAGNOSIS — M5410 Radiculopathy, site unspecified: Secondary | ICD-10-CM | POA: Diagnosis not present

## 2014-10-11 DIAGNOSIS — Z113 Encounter for screening for infections with a predominantly sexual mode of transmission: Secondary | ICD-10-CM | POA: Insufficient documentation

## 2014-10-11 DIAGNOSIS — K219 Gastro-esophageal reflux disease without esophagitis: Secondary | ICD-10-CM

## 2014-10-11 DIAGNOSIS — I671 Cerebral aneurysm, nonruptured: Secondary | ICD-10-CM | POA: Diagnosis not present

## 2014-10-11 DIAGNOSIS — M62838 Other muscle spasm: Secondary | ICD-10-CM

## 2014-10-11 DIAGNOSIS — E669 Obesity, unspecified: Secondary | ICD-10-CM

## 2014-10-11 DIAGNOSIS — Z1159 Encounter for screening for other viral diseases: Secondary | ICD-10-CM

## 2014-10-11 DIAGNOSIS — M6248 Contracture of muscle, other site: Secondary | ICD-10-CM

## 2014-10-11 DIAGNOSIS — R7302 Impaired glucose tolerance (oral): Secondary | ICD-10-CM

## 2014-10-11 DIAGNOSIS — R519 Headache, unspecified: Secondary | ICD-10-CM

## 2014-10-11 DIAGNOSIS — R51 Headache: Secondary | ICD-10-CM

## 2014-10-11 MED ORDER — METHYLPREDNISOLONE ACETATE 80 MG/ML IJ SUSP
80.0000 mg | Freq: Once | INTRAMUSCULAR | Status: AC
  Administered 2014-10-11: 80 mg via INTRAMUSCULAR

## 2014-10-11 MED ORDER — DIAZEPAM 5 MG PO TABS: ORAL_TABLET | ORAL | Status: DC

## 2014-10-11 MED ORDER — KETOROLAC TROMETHAMINE 60 MG/2ML IM SOLN
60.0000 mg | Freq: Once | INTRAMUSCULAR | Status: AC
  Administered 2014-10-11: 60 mg via INTRAMUSCULAR

## 2014-10-11 MED ORDER — PREDNISONE 5 MG PO TABS: 5.0000 mg | ORAL_TABLET | Freq: Two times a day (BID) | ORAL | Status: DC

## 2014-10-11 NOTE — Progress Notes (Signed)
Subjective:    Patient ID: Anna Cantu, female    DOB: [birth year], 49 y.o.   MRN: [id number]  HPI The PT is here for follow up and re-evaluation of chronic medical conditions, medication management and review of any available recent lab and radiology data.  Preventive health is updated, specifically  Cancer screening and Immunization.  Needs mammogram . The PT denies any adverse reactions to current medications since the last visit.  C/o increased left knee pain , swelling and instability in past 3 months, no falls C/o increased headache, neck spasm and low back pain Wants STD testing due to recent risky behavior Increased stress of caring for parents, limited finances and health challenges, but overall managing them well Unable to exercise as she desires due to ortho and spine problems      Review of Systems See HPI Denies recent fever or chills. Denies sinus pressure, nasal congestion, ear pain or sore throat. Denies chest congestion, productive cough or wheezing. Denies chest pains, palpitations and leg swelling Denies abdominal pain, nausea, vomiting,diarrhea or constipation.   Denies dysuria, frequency, hesitancy or incontinence. Denies skin break down or rash.        Objective:   Physical Exam BP 106/68 mmHg  Pulse 80  Resp 18  Ht 5\' 3"  (1.6 m)  Wt 198 lb 1.9 oz (89.867 kg)  BMI 35.10 kg/m2  SpO2 100%  LMP 07/01/2013 Patient alert and oriented and in no cardiopulmonary distress.  HEENT: No facial asymmetry, EOMI,   oropharynx pink and moist.  Neck supple no JVD, no mass.  Chest: Clear to auscultation bilaterally.  CVS: S1, S2 no murmurs, no S3.Regular rate.  ABD: Soft non tender.   Ext: No edema  JO:[id number]  ROM spine,  And left  Knees which is swollen, and tender with crepitus  Skin: Intact, no ulcerations or rash noted.  Psych: Good eye contact, normal affect. Memory intact not anxious or depressed appearing.  CNS: CN 2-12 intact,  power,  normal throughout.no focal deficits noted.        Assessment & Plan:  Back pain with left-sided radiculopathy Uncontrolled.Toradol and depo medrol administered IM in the office , to be followed by a short course of oral prednisone and NSAIDS. Needs updated MRI based on symptom severity which includes leg weakness, and re eval by neurosurgeon    Left knee pain Increased pain and swelling with instability, will refer for ortho re eval   Neck muscle spasm Increased spasm short course of valium   GERD (gastroesophageal reflux disease) Controlled, no change in medication     Obesity (BMI 35.0-39.9 without comorbidity) Unchnaged Patient re-educated about  the importance of commitment to a  minimum of 150 minutes of exercise per week.  The importance of healthy food choices with portion control discussed. Encouraged to start a food diary, count calories and to consider  joining a support group. Sample diet sheets offered. Goals set by the patient for the next several months.   Weight /BMI 10/11/2014 06/09/2014 04/18/2014  WEIGHT 198 lb 1.9 oz 200 lb 185 lb  HEIGHT 5\' 3"  5\' 3"  5\' 3"   BMI 35.1 kg/m2 35.44 kg/m2 32.78 kg/m2    Current exercise per week 90 minutes.Limited by back and knee pain    Vaginitis and vulvovaginitis STD testing requested by patien due to high risk behavior, urine specimens sent   Headache Chronic headaches, with neck pain also has small aneurysm, did not see neurosurgeon when referred due to schedule conflict,  also has increased back pain with known disc disease will refer for both

## 2014-10-11 NOTE — Assessment & Plan Note (Addendum)
Uncontrolled.Toradol and depo medrol administered IM in the office , to be followed by a short course of oral prednisone and NSAIDS. Needs updated MRI based on symptom severity which includes leg weakness, and re eval by neurosurgeon

## 2014-10-11 NOTE — Patient Instructions (Signed)
F/u in 4 month, call if you need me before  You are referrredto orthopedics in [place] re left knee  You are referred for MRI of back and to Dr Rita Ohara  Prednisone and valium prescribed for short term use as discussed  You will be contacted with urine results  Fasting lipid, cmp, CBc, TSH and cBc and HIV in 4 month  Pls sched mammogram  It is important that you exercise regularly at least 30 minutes 5 times a week. If you develop chest pain, have severe difficulty breathing, or feel very tired, stop exercising immediately and seek medical attention   A healthy diet is rich in fruit, vegetables and whole grains. Poultry fish, nuts and beans are a healthy choice for protein rather then red meat. A low sodium diet and drinking 64 ounces of water daily is generally recommended. Oils and sweet should be limited. Carbohydrates especially for those who are diabetic or overweight, should be limited to 605 gram per meal. It is important to eat on a regular schedule, at least 3 times daily. Snacks should be primarily fruits, vegetables or nuts.

## 2014-10-12 ENCOUNTER — Telehealth: Payer: Self-pay | Admitting: *Deleted

## 2014-10-12 LAB — URINE CYTOLOGY ANCILLARY ONLY
Chlamydia: NEGATIVE
Neisseria Gonorrhea: NEGATIVE
TRICH (WINDOWPATH): NEGATIVE

## 2014-10-12 NOTE — Telephone Encounter (Signed)
Pt called LMOM for Loma Sousa for her Fannie Knee. [phone]. Pt is requesting a return call. [phone]

## 2014-10-14 ENCOUNTER — Encounter: Payer: Self-pay | Admitting: Family Medicine

## 2014-10-14 DIAGNOSIS — R519 Headache, unspecified: Secondary | ICD-10-CM | POA: Insufficient documentation

## 2014-10-14 DIAGNOSIS — R51 Headache: Secondary | ICD-10-CM

## 2014-10-14 LAB — URINE CYTOLOGY ANCILLARY ONLY
Bacterial vaginitis: NEGATIVE
CANDIDA VAGINITIS: NEGATIVE

## 2014-10-14 NOTE — Assessment & Plan Note (Signed)
Increased pain and swelling with instability, will refer for ortho re eval

## 2014-10-14 NOTE — Assessment & Plan Note (Addendum)
STD testing requested by patien due to high risk behavior, urine specimens sent

## 2014-10-14 NOTE — Assessment & Plan Note (Signed)
Increased spasm short course of valium

## 2014-10-14 NOTE — Assessment & Plan Note (Signed)
Unchnaged Patient re-educated about  the importance of commitment to a  minimum of 150 minutes of exercise per week.  The importance of healthy food choices with portion control discussed. Encouraged to start a food diary, count calories and to consider  joining a support group. Sample diet sheets offered. Goals set by the patient for the next several months.   Weight /BMI 10/11/2014 06/09/2014 04/18/2014  WEIGHT 198 lb 1.9 oz 200 lb 185 lb  HEIGHT 5\' 3"  5\' 3"  5\' 3"   BMI 35.1 kg/m2 35.44 kg/m2 32.78 kg/m2    Current exercise per week 90 minutes.Limited by back and knee pain

## 2014-10-14 NOTE — Telephone Encounter (Signed)
Called and left message for patient to return call.  

## 2014-10-14 NOTE — Assessment & Plan Note (Signed)
Chronic headaches, with neck pain also has small aneurysm, did not see neurosurgeon when referred due to schedule conflict, also has increased back pain with known disc disease will refer for both

## 2014-10-14 NOTE — Assessment & Plan Note (Signed)
Controlled, no change in medication  

## 2014-10-21 ENCOUNTER — Other Ambulatory Visit (HOSPITAL_COMMUNITY): Payer: Managed Care, Other (non HMO)

## 2014-10-26 ENCOUNTER — Encounter: Payer: Self-pay | Admitting: Family Medicine

## 2014-10-26 ENCOUNTER — Other Ambulatory Visit: Payer: Self-pay

## 2014-10-26 ENCOUNTER — Ambulatory Visit (HOSPITAL_COMMUNITY)
Admission: RE | Admit: 2014-10-26 | Discharge: 2014-10-26 | Disposition: A | Discharge status: Home / Self Care | Payer: Managed Care, Other (non HMO) | Source: Ambulatory Visit | Attending: Family Medicine | Admitting: Family Medicine

## 2014-10-26 DIAGNOSIS — M5410 Radiculopathy, site unspecified: Secondary | ICD-10-CM | POA: Diagnosis not present

## 2014-10-26 MED ORDER — HYDROCODONE-ACETAMINOPHEN 7.5-325 MG PO TABS: ORAL_TABLET | ORAL | Status: DC

## 2014-10-27 ENCOUNTER — Other Ambulatory Visit: Payer: Self-pay

## 2014-10-27 MED ORDER — METHOCARBAMOL 750 MG PO TABS: 750.0000 mg | ORAL_TABLET | Freq: Every evening | ORAL | Status: DC | PRN

## 2014-11-04 ENCOUNTER — Telehealth: Payer: Self-pay

## 2014-11-04 NOTE — Telephone Encounter (Signed)
Based on repprted MRI of lumbar spine , I would not expect her to have such severe symptoms of weakness and numbness, I recommend she also see see a neurologist for nerve conduction testing and evaluation There is no medication to give her for her symptoms, she needs to practrice fall safety She tends to want her referrals in [facility], [place] to refer to Dr Merlene Laughter eval and treat progressive lower extremity weakness and numbness, will take much longer than a referral to neurology group in [place] which is within 1 to  2 weeks so that is the better option since she is calling with concerns pls enter referral I will sign

## 2014-11-11 NOTE — Telephone Encounter (Signed)
Called and left message for patient to return call.  

## 2014-11-14 NOTE — Telephone Encounter (Signed)
Patient aware.  Will wait until after she is seen by Neurosurgeon

## 2014-11-25 ENCOUNTER — Telehealth: Payer: Self-pay | Admitting: Family Medicine

## 2014-11-25 NOTE — Telephone Encounter (Signed)
Left message that rx was ready

## 2014-12-16 ENCOUNTER — Telehealth: Payer: Self-pay

## 2014-12-16 ENCOUNTER — Other Ambulatory Visit: Payer: Self-pay

## 2014-12-16 DIAGNOSIS — F411 Generalized anxiety disorder: Secondary | ICD-10-CM

## 2014-12-16 DIAGNOSIS — B029 Zoster without complications: Secondary | ICD-10-CM

## 2014-12-16 MED ORDER — HYDROXYZINE HCL 25 MG PO TABS: 25.0000 mg | ORAL_TABLET | Freq: Three times a day (TID) | ORAL | Status: DC | PRN

## 2014-12-16 MED ORDER — ALPRAZOLAM 0.25 MG PO TABS: ORAL_TABLET | ORAL | Status: DC

## 2014-12-16 NOTE — Telephone Encounter (Signed)
Noted and med refilled.   Patient aware to check with pharmacy this evening.

## 2014-12-16 NOTE — Telephone Encounter (Signed)
Refill x 1 please 

## 2014-12-26 ENCOUNTER — Other Ambulatory Visit: Payer: Self-pay

## 2014-12-26 MED ORDER — HYDROCODONE-ACETAMINOPHEN 7.5-325 MG PO TABS: ORAL_TABLET | ORAL | Status: DC

## 2015-01-17 ENCOUNTER — Telehealth: Payer: Self-pay | Admitting: *Deleted

## 2015-01-17 NOTE — Telephone Encounter (Signed)
Thinks she has pink eye. One eye is a little red but not swollen. Told to call in the am if it was gritty and swollen and we would work her in

## 2015-01-17 NOTE — Telephone Encounter (Signed)
Pt LMOM for nurse to call her she stated she is still having sinus or something going on.

## 2015-01-18 ENCOUNTER — Ambulatory Visit (INDEPENDENT_AMBULATORY_CARE_PROVIDER_SITE_OTHER): Payer: Managed Care, Other (non HMO) | Admitting: Family Medicine

## 2015-01-18 ENCOUNTER — Encounter: Payer: Self-pay | Admitting: Family Medicine

## 2015-01-18 ENCOUNTER — Telehealth: Payer: Self-pay | Admitting: *Deleted

## 2015-01-18 VITALS — BP 130/82 | HR 72 | Resp 16 | Ht 63.0 in | Wt 201.0 lb

## 2015-01-18 DIAGNOSIS — R350 Frequency of micturition: Secondary | ICD-10-CM | POA: Insufficient documentation

## 2015-01-18 DIAGNOSIS — H109 Unspecified conjunctivitis: Secondary | ICD-10-CM | POA: Insufficient documentation

## 2015-01-18 DIAGNOSIS — M79675 Pain in left toe(s): Secondary | ICD-10-CM

## 2015-01-18 DIAGNOSIS — G8929 Other chronic pain: Secondary | ICD-10-CM | POA: Insufficient documentation

## 2015-01-18 DIAGNOSIS — Z1211 Encounter for screening for malignant neoplasm of colon: Secondary | ICD-10-CM | POA: Diagnosis not present

## 2015-01-18 DIAGNOSIS — J0100 Acute maxillary sinusitis, unspecified: Secondary | ICD-10-CM

## 2015-01-18 DIAGNOSIS — J029 Acute pharyngitis, unspecified: Secondary | ICD-10-CM | POA: Diagnosis not present

## 2015-01-18 DIAGNOSIS — M5410 Radiculopathy, site unspecified: Secondary | ICD-10-CM

## 2015-01-18 DIAGNOSIS — E669 Obesity, unspecified: Secondary | ICD-10-CM

## 2015-01-18 DIAGNOSIS — J329 Chronic sinusitis, unspecified: Secondary | ICD-10-CM | POA: Insufficient documentation

## 2015-01-18 LAB — POCT URINALYSIS DIPSTICK
BILIRUBIN UA: NEGATIVE
GLUCOSE UA: NEGATIVE
KETONES UA: NEGATIVE
Leukocytes, UA: NEGATIVE
Nitrite, UA: NEGATIVE
Protein, UA: NEGATIVE
RBC UA: NEGATIVE
SPEC GRAV UA: 1.02
UROBILINOGEN UA: 0.2
pH, UA: 6

## 2015-01-18 LAB — POCT RAPID STREP A (OFFICE): Rapid Strep A Screen: NEGATIVE

## 2015-01-18 LAB — POC HEMOCCULT BLD/STL (OFFICE/1-CARD/DIAGNOSTIC): Fecal Occult Blood, POC: NEGATIVE

## 2015-01-18 MED ORDER — AZITHROMYCIN 250 MG PO TABS: ORAL_TABLET | ORAL | Status: DC

## 2015-01-18 MED ORDER — FLUCONAZOLE 150 MG PO TABS: ORAL_TABLET | ORAL | Status: DC

## 2015-01-18 NOTE — Assessment & Plan Note (Signed)
10 year h/o pain in 5th toe on left, [place] fell on this, area of localiaxzed tenderness on lateral aspect, no nodule palpabl;e, podiatry to eval

## 2015-01-18 NOTE — Patient Instructions (Signed)
F/u as before  You are treated for sinusitis and you are referred to podiatrist re  Chronic toe pain, left 5th You have mild conjunctivitis of left eye, info provided on this  Work excuse for 8/24 and 01/19/2015  Rapid strep test is negative, no blood in stool and urine is being checked   Viral Conjunctivitis Conjunctivitis is an irritation (inflammation) of the clear membrane that covers the white part of the eye (the conjunctiva). The irritation can also happen on the underside of the eyelids. Conjunctivitis makes the eye red or pink in color. This is what is commonly known as pink eye. Viral conjunctivitis can spread easily (contagious). CAUSES   Infection from virus on the surface of the eye.  Infection from the irritation or injury of nearby tissues such as the eyelids or cornea.  More serious inflammation or infection on the inside of the eye.  Other eye diseases.  The use of certain eye medications. SYMPTOMS  The normally white color of the eye or the underside of the eyelid is usually pink or red in color. The pink eye is usually associated with irritation, tearing and some sensitivity to light. Viral conjunctivitis is often associated with a clear, watery discharge. If a discharge is present, there may also be some blurred vision in the affected eye. DIAGNOSIS  Conjunctivitis is diagnosed by an eye exam. The eye specialist looks for changes in the surface tissues of the eye which take on changes characteristic of the specific types of conjunctivitis. A sample of any discharge may be collected on a Q-Tip (sterile swap). The sample will be sent to a lab to see whether or not the inflammation is caused by bacterial or viral infection. TREATMENT  Viral conjunctivitis will not respond to medicines that kill germs (antibiotics). Treatment is aimed at stopping a bacterial infection on top of the viral infection. The goal of treatment is to relieve symptoms (such as itching) with  antihistamine drops or other eye medications.  HOME CARE INSTRUCTIONS   To ease discomfort, apply a cool, clean wash cloth to your eye for 10 to 20 minutes, 3 to 4 times a day.  Gently wipe away any drainage from the eye with a warm, wet washcloth or a cotton ball.  Wash your hands often with soap and use paper towels to dry.  Do not share towels or washcloths. This may spread the infection.  Change or wash your pillowcase every day.  You should not use eye make-up until the infection is gone.  Stop using contacts lenses. Ask your eye professional how to sterilize or replace them before using again. This depends on the type of contact lenses used.  Do not touch the edge of the eyelid with the eye drop bottle or ointment tube when applying medications to the affected eye. This will stop you from spreading the infection to the other eye or to others. SEEK IMMEDIATE MEDICAL CARE IF:   The infection has not improved within 3 days of beginning treatment.  A watery discharge from the eye develops.  Pain in the eye increases.  The redness is spreading.  Vision becomes blurred.  An oral temperature above 102 F (38.9 C) develops, or as your caregiver suggests.  Facial pain, redness or swelling develops.  Any problems that may be related to the prescribed medicine develop. MAKE SURE YOU:   Understand these instructions.  Will watch your condition.  Will get help right away if you are not doing well or get  worse. Document Released: 05/13/2005 Document Revised: 08/05/2011 Document Reviewed: 12/31/2007 [facility] Patient Information 2015 [place], [place]. This information is not intended to replace advice given to you by your health care provider. Make sure you discuss any questions you have with your health care provider.

## 2015-01-18 NOTE — Assessment & Plan Note (Signed)
Recent strep exposure, rapid strep and exam negative

## 2015-01-18 NOTE — Telephone Encounter (Signed)
Pt called wanting to know does she need to keep taking the musinec

## 2015-01-18 NOTE — Assessment & Plan Note (Addendum)
Work excuse x 2 days, acute viral conjunctivitis, no vision loss, purulent drainage or pain Pt education material also provided

## 2015-01-18 NOTE — Telephone Encounter (Signed)
Pt aware to just take what dr gave her

## 2015-01-19 LAB — COMPREHENSIVE METABOLIC PANEL
ALK PHOS: 75 U/L (ref 33–115)
ALT: 16 U/L (ref 6–29)
AST: 14 U/L (ref 10–35)
Albumin: 4.1 g/dL (ref 3.6–5.1)
BILIRUBIN TOTAL: 0.3 mg/dL (ref 0.2–1.2)
BUN: 9 mg/dL (ref 7–25)
CO2: 27 mmol/L (ref 20–31)
Calcium: 9.5 mg/dL (ref 8.6–10.2)
Chloride: 104 mmol/L (ref 98–110)
Creat: 0.71 mg/dL (ref 0.50–1.10)
GLUCOSE: 96 mg/dL (ref 65–99)
Potassium: 4.6 mmol/L (ref 3.5–5.3)
Sodium: 141 mmol/L (ref 135–146)
Total Protein: 6.5 g/dL (ref 6.1–8.1)

## 2015-01-19 LAB — LIPID PANEL
Cholesterol: 181 mg/dL (ref 125–200)
HDL: 70 mg/dL (ref >=46)
LDL Cholesterol: 87 mg/dL (ref <130)
Total CHOL/HDL Ratio: 2.6 Ratio (ref <=5.0)
Triglycerides: 119 mg/dL (ref <150)
VLDL: 24 mg/dL (ref <30)

## 2015-01-19 LAB — CBC
HCT: 40.7 % (ref 36.0–46.0)
Hemoglobin: 13.2 g/dL (ref 12.0–15.0)
MCH: 30.3 pg (ref 26.0–34.0)
MCHC: 32.4 g/dL (ref 30.0–36.0)
MCV: 93.6 fL (ref 78.0–100.0)
MPV: 10.3 fL (ref 8.6–12.4)
Platelets: 350 10*3/uL (ref 150–400)
RBC: 4.35 MIL/uL (ref 3.87–5.11)
RDW: 13.3 % (ref 11.5–15.5)
WBC: 6.7 10*3/uL (ref 4.0–10.5)

## 2015-01-19 LAB — TSH: TSH: 0.543 u[IU]/mL (ref 0.350–4.500)

## 2015-01-19 LAB — HIV ANTIBODY (ROUTINE TESTING W REFLEX): HIV: NONREACTIVE

## 2015-01-19 LAB — HEMOGLOBIN A1C
HEMOGLOBIN A1C: 5.4 % (ref <5.7)
Mean Plasma Glucose: 108 mg/dL (ref <117)

## 2015-01-19 NOTE — Progress Notes (Signed)
   Subjective:    Patient ID: Anna Cantu, female    DOB: Apr 25, 1966, 49 y.o.   MRN: [id number]  HPI 2 day h/o itchy pink left eye with watery drainage, not painful. C/o sore throat and positive strep exposure C/o urinary frequency x 4 days C/o black stool last week, states now it is normal but wants it checked, unable to associate stool color change with something she had eaten    Review of Systems See HPI Denies recent fever has had  chills. C/o  Right maxillary  sinus pressure, denies nasal congestion, ear pain  Denies chest congestion, productive cough or wheezing. Denies chest pains, palpitations and leg swelling Denies abdominal pain, nausea, vomiting,diarrhea or constipation.   Denies dysuria, , hesitancy or incontinence. Denies joint pain, swelling and limitation in mobility. C/O PAINFUL 5TH TOE FOR OVER 10 YEARS FOLLOWING TRAUMA , WHEN A [place] FELL ON THE FOOt Denies headaches, seizures, numbness, or tingling. Denies depression, anxiety or insomnia. Denies skin break down or rash.        Objective:   Physical Exam BP 130/82 mmHg  Pulse 72  Resp 16  Ht 5\' 3"  (1.6 m)  Wt 201 lb (91.173 kg)  BMI 35.61 kg/m2  SpO2 99%  LMP 07/01/2013 Patient alert and oriented and in no cardiopulmonary distress.  HEENT: No facial asymmetry, EOMI,   oropharynx pink and moist. No exudate, Neck supple no JVD, no mass.Mild conjunctival injection of left eye, no drainage , EOMI, right maxillary  sinus tenderness Chest: Clear to auscultation bilaterally.  CVS: S1, S2 no murmurs, no S3.Regular rate.  ABD: Soft non tender.  Rectal: no mass, heme negative stool  Ext: No edema  MS: Adequate ROM spine, shoulders, hips and knees.Tender to palpation over lateral aspect of left 5th toe  Skin: Intact, no ulcerations or rash noted.  Psych: Good eye contact, normal affect. Memory intact not anxious or depressed appearing.  CNS: CN 2-12 intact, power,  normal throughout.no  focal deficits noted.        Assessment & Plan:  Conjunctivitis Work excuse x 2 days, acute viral conjunctivitis, no vision loss, purulent drainage or pain Pt education material also provided  Sore throat Recent strep exposure, rapid strep and exam negative  Chronic pain of toe of left foot 10 year h/o pain in 5th toe on left, [place] fell on this, area of localiaxzed tenderness on lateral aspect, no nodule palpabl;e, podiatry to eval  Back pain with left-sided radiculopathy Chronic and unchanged, no change in management  Urinary frequency Urinalysis in office negative for infection  Obesity (BMI 35.0-39.9 without comorbidity) Deteriorated. Patient re-educated about  the importance of commitment to a  minimum of 150 minutes of exercise per week.  The importance of healthy food choices with portion control discussed. Encouraged to start a food diary, count calories and to consider  joining a support group. Sample diet sheets offered. Goals set by the patient for the next several months.   Weight /BMI 01/18/2015 10/11/2014 06/09/2014  WEIGHT 201 lb 198 lb 1.9 oz 200 lb  HEIGHT 5\' 3"  5\' 3"  5\' 3"   BMI 35.61 kg/m2 35.1 kg/m2 35.44 kg/m2    Current exercise per week 90 minutes.   Sinusitis Right maxillary tenderness,  z pack prescribed  Special screening for malignant neoplasms, colon C/o recent black stool, OB testing in office negative for blood , pt eassured

## 2015-01-23 ENCOUNTER — Telehealth: Payer: Self-pay | Admitting: *Deleted

## 2015-01-23 DIAGNOSIS — H539 Unspecified visual disturbance: Secondary | ICD-10-CM

## 2015-01-23 NOTE — Telephone Encounter (Signed)
Pt called stating her eye is still red and she is requesting her lab results

## 2015-01-24 NOTE — Telephone Encounter (Signed)
Changed referral to [facility] who can generally see her sooner than shapiro

## 2015-01-24 NOTE — Telephone Encounter (Signed)
Still having left eye redness and pressure and she thinks she needs to see her eye Dr. Having problems with blurry vision in both eyes as well. Will refer to [facility] for vision change

## 2015-01-24 NOTE — Telephone Encounter (Signed)
pls do refer her , dr Iona Hansen geenrally  More easily available, but if Dr Gershon Crane is her Doc then refer her there , whatever wor sbest for her , but with pain and red eye complaint I suggest soonest available

## 2015-02-02 ENCOUNTER — Telehealth: Payer: Self-pay | Admitting: *Deleted

## 2015-02-02 NOTE — Telephone Encounter (Signed)
Pt had a appt with Dr. Iona Hansen today 02/02/15 at 2:45, Dr. Iona Hansen office called lmom stating Anna Cantu was a no show for her appt today

## 2015-02-10 ENCOUNTER — Other Ambulatory Visit: Payer: Self-pay

## 2015-02-10 MED ORDER — HYDROCODONE-ACETAMINOPHEN 7.5-325 MG PO TABS: ORAL_TABLET | ORAL | Status: DC

## 2015-02-20 ENCOUNTER — Ambulatory Visit: Payer: Managed Care, Other (non HMO) | Admitting: Family Medicine

## 2015-02-20 ENCOUNTER — Encounter: Payer: Self-pay | Admitting: Family Medicine

## 2015-02-22 ENCOUNTER — Telehealth: Payer: Self-pay

## 2015-02-23 ENCOUNTER — Other Ambulatory Visit: Payer: Self-pay | Admitting: Family Medicine

## 2015-02-23 ENCOUNTER — Ambulatory Visit (INDEPENDENT_AMBULATORY_CARE_PROVIDER_SITE_OTHER): Payer: Managed Care, Other (non HMO)

## 2015-02-23 DIAGNOSIS — Z1231 Encounter for screening mammogram for malignant neoplasm of breast: Secondary | ICD-10-CM

## 2015-02-23 DIAGNOSIS — M5410 Radiculopathy, site unspecified: Secondary | ICD-10-CM | POA: Diagnosis not present

## 2015-02-23 MED ORDER — KETOROLAC TROMETHAMINE 60 MG/2ML IM SOLN
60.0000 mg | Freq: Once | INTRAMUSCULAR | Status: AC
  Administered 2015-02-23: 60 mg via INTRAMUSCULAR

## 2015-02-23 MED ORDER — METHYLPREDNISOLONE ACETATE 80 MG/ML IJ SUSP
80.0000 mg | Freq: Once | INTRAMUSCULAR | Status: AC
  Administered 2015-02-23: 80 mg via INTRAMUSCULAR

## 2015-02-23 NOTE — Telephone Encounter (Signed)
pls document in note severity and location and duration of pain flare , and OK to administer toradol 60 mg and depo medrol 80 mg IM today as a nurse visit

## 2015-02-23 NOTE — Progress Notes (Signed)
Back pain worsened 2 days ago and its in her lower back and rates pain at a 10. Ok to get pain injections per Dr. No complications noted

## 2015-02-23 NOTE — Telephone Encounter (Signed)
Called and left voicemail for patient.

## 2015-03-01 DIAGNOSIS — Z1211 Encounter for screening for malignant neoplasm of colon: Secondary | ICD-10-CM | POA: Insufficient documentation

## 2015-03-01 NOTE — Assessment & Plan Note (Signed)
Right maxillary tenderness,  z pack prescribed

## 2015-03-01 NOTE — Assessment & Plan Note (Signed)
Deteriorated. Patient re-educated about  the importance of commitment to a  minimum of 150 minutes of exercise per week.  The importance of healthy food choices with portion control discussed. Encouraged to start a food diary, count calories and to consider  joining a support group. Sample diet sheets offered. Goals set by the patient for the next several months.   Weight /BMI 01/18/2015 10/11/2014 06/09/2014  WEIGHT 201 lb 198 lb 1.9 oz 200 lb  HEIGHT 5\' 3"  5\' 3"  5\' 3"   BMI 35.61 kg/m2 35.1 kg/m2 35.44 kg/m2    Current exercise per week 90 minutes.

## 2015-03-01 NOTE — Assessment & Plan Note (Signed)
C/o recent black stool, OB testing in office negative for blood , pt eassured

## 2015-03-01 NOTE — Assessment & Plan Note (Signed)
Chronic and unchanged, no change in management 

## 2015-03-01 NOTE — Assessment & Plan Note (Signed)
Urinalysis in office negative for infection  

## 2015-03-09 ENCOUNTER — Ambulatory Visit (HOSPITAL_COMMUNITY)
Admission: RE | Admit: 2015-03-09 | Discharge: 2015-03-09 | Disposition: A | Discharge status: Home / Self Care | Payer: Managed Care, Other (non HMO) | Source: Ambulatory Visit | Attending: Family Medicine | Admitting: Family Medicine

## 2015-03-09 ENCOUNTER — Ambulatory Visit (HOSPITAL_COMMUNITY): Payer: Managed Care, Other (non HMO)

## 2015-03-09 DIAGNOSIS — Z1231 Encounter for screening mammogram for malignant neoplasm of breast: Secondary | ICD-10-CM | POA: Diagnosis present

## 2015-03-17 ENCOUNTER — Other Ambulatory Visit: Payer: Self-pay

## 2015-03-17 MED ORDER — HYDROCODONE-ACETAMINOPHEN 7.5-325 MG PO TABS: ORAL_TABLET | ORAL | Status: DC

## 2015-03-23 ENCOUNTER — Encounter: Payer: Self-pay | Admitting: Family Medicine

## 2015-03-23 ENCOUNTER — Ambulatory Visit (INDEPENDENT_AMBULATORY_CARE_PROVIDER_SITE_OTHER): Payer: Managed Care, Other (non HMO) | Admitting: Family Medicine

## 2015-03-23 VITALS — BP 104/72 | HR 70 | Temp 99.0°F | Resp 18 | Ht 63.0 in | Wt 197.0 lb

## 2015-03-23 DIAGNOSIS — M25562 Pain in left knee: Secondary | ICD-10-CM

## 2015-03-23 DIAGNOSIS — E669 Obesity, unspecified: Secondary | ICD-10-CM

## 2015-03-23 DIAGNOSIS — J029 Acute pharyngitis, unspecified: Secondary | ICD-10-CM | POA: Diagnosis not present

## 2015-03-23 DIAGNOSIS — K219 Gastro-esophageal reflux disease without esophagitis: Secondary | ICD-10-CM | POA: Diagnosis not present

## 2015-03-23 DIAGNOSIS — J3089 Other allergic rhinitis: Secondary | ICD-10-CM | POA: Diagnosis not present

## 2015-03-23 DIAGNOSIS — J309 Allergic rhinitis, unspecified: Secondary | ICD-10-CM | POA: Insufficient documentation

## 2015-03-23 MED ORDER — IBUPROFEN 800 MG PO TABS: 800.0000 mg | ORAL_TABLET | Freq: Every day | ORAL | Status: DC | PRN

## 2015-03-23 MED ORDER — PREDNISONE 5 MG PO TABS: 5.0000 mg | ORAL_TABLET | Freq: Two times a day (BID) | ORAL | Status: AC

## 2015-03-23 MED ORDER — MOMETASONE FUROATE 50 MCG/ACT NA SUSP: 2.0000 | Freq: Two times a day (BID) | NASAL | Status: DC

## 2015-03-23 MED ORDER — FIRST-DUKES MOUTHWASH MT SUSP: 5.0000 mL | Freq: Four times a day (QID) | OROMUCOSAL | Status: DC

## 2015-03-23 MED ORDER — PANTOPRAZOLE SODIUM 40 MG PO TBEC: 40.0000 mg | DELAYED_RELEASE_TABLET | Freq: Every day | ORAL | Status: DC

## 2015-03-23 NOTE — Progress Notes (Signed)
   Subjective:    Patient ID: Anna Cantu, female    DOB: May 03, 1966, 49 y.o.   MRN: [id number]  HPI  3 day h/o excess discomfort in throat , like a tickle, states  Feels as tho throat is closing at times'  has allergies chroniically, not using daily med. Symptoms worse following exposure to carpet cleaner used at work on 03/18/2015 the day prior to her return to work on 03/19/2015 Dry cough, no fever or chills, c/o fatigue   Review of Systems See HPI . Denies chest pains, palpitations and leg swelling Denies, vomiting,diarrhea or constipation.   Denies dysuria, frequency, hesitancy or incontinence. Denies joint pain, swelling and limitation in mobility. Denies uncontrolled  headaches, seizures, numbness, or tingling. Denies depression, uncontrolled  anxiety or insomnia. Denies skin break down or rash.        Objective:   Physical Exam BP 104/72 mmHg  Pulse 70  Temp(Src) 99 F (37.2 C)  Resp 18  Ht 5\' 3"  (1.6 m)  Wt 197 lb (89.359 kg)  BMI 34.91 kg/m2  SpO2 99%  LMP 07/01/2013   Patient alert and oriented and in no cardiopulmonary distress.  HEENT: No facial asymmetry, EOMI,   oropharynx pink and moist.  Neck supple no JVD, no mass. No lymphadenopathy. TM clear . Nasal mucosa erythematous and edematous Chest: Clear to auscultation bilaterally.  CVS: S1, S2 no murmurs, no S3.Regular rate.  ABD: Soft non tender.   Ext: No edema  MS: Adequate ROM spine, shoulders, hips and knees.  Skin: Intact, no ulcerations or rash noted.  Psych: Good eye contact, normal affect. Memory intact not anxious or depressed appearing.  CNS: CN 2-12 intact, power,  normal throughout.no focal deficits noted.        Assessment & Plan:  Allergic rhinitis Uncontrolled, daily use of allergy medications to start, nasonex added  Obesity (BMI 35.0-39.9 without comorbidity) Improved. Patient re-educated about  the importance of commitment to a  minimum of 150 minutes of  exercise per week.  The importance of healthy food choices with portion control discussed. Encouraged to start a food diary, count calories and to consider  joining a support group. Sample diet sheets offered. Goals set by the patient for the next several months.   Weight /BMI 03/23/2015 01/18/2015 10/11/2014  WEIGHT 197 lb 201 lb 198 lb 1.9 oz  HEIGHT 5\' 3"  5\' 3"  5\' 3"   BMI 34.91 kg/m2 35.61 kg/m2 35.1 kg/m2    Current exercise per week 90 minutes.   GERD (gastroesophageal reflux disease) Uncontrolled, with chronic hoarseness and sore throat, start daily PPI  Pharyngitis Symptomatic treatment with mouthwash  Left knee pain Judicious use of ibuprofen for flares in knee, neck and back pain

## 2015-03-23 NOTE — Patient Instructions (Addendum)
F/u in 4 month, call if you need me sooner  Please return in 2 weeks for flu vaccine  Start daily nasonex and protonix for allergies and reflux  Ibuprofen to be used no more than twice per week  Dukes mouthwash sent for throat pain  Prednisone dsent for 5 daysfor uncontrolled allergy symptoms  Please work on good  health habits so that your health will improve. 1. Commitment to daily physical activity for 30 to 60  minutes, if you are able to do this.  2. Commitment to wise food choices. Aim for half of your  food intake to be vegetable and fruit, one quarter starchy foods, and one quarter protein. Try to eat on a regular schedule  3 meals per day, snacking between meals should be limited to vegetables or fruits or small portions of nuts. 64 ounces of water per day is generally recommended, unless you have specific health conditions, like heart failure or kidney failure where you will need to limit fluid intake.  3. Commitment to sufficient and a  good quality of physical and mental rest daily, generally between 6 to 8 hours per day.  WITH PERSISTANCE AND PERSEVERANCE, THE IMPOSSIBLE , BECOMES THE NORM!  Thanks for choosing [facility], [place], we consider it a privelige to serve you.

## 2015-04-06 ENCOUNTER — Telehealth: Payer: Self-pay

## 2015-04-06 DIAGNOSIS — J0100 Acute maxillary sinusitis, unspecified: Secondary | ICD-10-CM

## 2015-04-06 NOTE — Addendum Note (Signed)
Addended by: Denman George B on: 04/06/2015 12:14 PM   Modules accepted: Orders

## 2015-04-06 NOTE — Telephone Encounter (Signed)
Send in a zpack x 1 please and let her know , also order cbc and diff, chem 7  Also send fluconazole 150 mg one tab if needed

## 2015-04-06 NOTE — Telephone Encounter (Signed)
Attempted to reach patient.   Phone number not working.  Will try again.  Medication sent to pharmacy and labs ordered.

## 2015-04-06 NOTE — Telephone Encounter (Signed)
Called and left message for patient notifying of medication and need for labs.

## 2015-04-07 ENCOUNTER — Other Ambulatory Visit: Payer: Self-pay

## 2015-04-07 MED ORDER — AZITHROMYCIN 250 MG PO TABS: ORAL_TABLET | ORAL | Status: DC

## 2015-04-07 MED ORDER — FLUCONAZOLE 150 MG PO TABS: 150.0000 mg | ORAL_TABLET | Freq: Once | ORAL | Status: DC

## 2015-04-08 LAB — CBC WITH DIFFERENTIAL/PLATELET
BASOS PCT: 0 % (ref 0–1)
Basophils Absolute: 0 10*3/uL (ref 0.0–0.1)
EOS ABS: 0.1 10*3/uL (ref 0.0–0.7)
EOS PCT: 2 % (ref 0–5)
HCT: 38.6 % (ref 36.0–46.0)
Hemoglobin: 12.5 g/dL (ref 12.0–15.0)
LYMPHS ABS: 4.2 10*3/uL — AB (ref 0.7–4.0)
Lymphocytes Relative: 57 % — ABNORMAL HIGH (ref 12–46)
MCH: 29.9 pg (ref 26.0–34.0)
MCHC: 32.4 g/dL (ref 30.0–36.0)
MCV: 92.3 fL (ref 78.0–100.0)
MONOS PCT: 6 % (ref 3–12)
MPV: 9.7 fL (ref 8.6–12.4)
Monocytes Absolute: 0.4 10*3/uL (ref 0.1–1.0)
NEUTROS ABS: 2.6 10*3/uL (ref 1.7–7.7)
NEUTROS PCT: 35 % — AB (ref 43–77)
PLATELETS: 324 10*3/uL (ref 150–400)
RBC: 4.18 MIL/uL (ref 3.87–5.11)
RDW: 13.8 % (ref 11.5–15.5)
WBC: 7.3 10*3/uL (ref 4.0–10.5)

## 2015-04-08 LAB — BASIC METABOLIC PANEL
BUN: 9 mg/dL (ref 7–25)
CALCIUM: 9.6 mg/dL (ref 8.6–10.2)
CO2: 27 mmol/L (ref 20–31)
CREATININE: 0.69 mg/dL (ref 0.50–1.10)
Chloride: 103 mmol/L (ref 98–110)
Glucose, Bld: 87 mg/dL (ref 65–99)
Potassium: 4.3 mmol/L (ref 3.5–5.3)
SODIUM: 140 mmol/L (ref 135–146)

## 2015-04-09 DIAGNOSIS — J020 Streptococcal pharyngitis: Secondary | ICD-10-CM | POA: Insufficient documentation

## 2015-04-09 NOTE — Assessment & Plan Note (Signed)
Improved. Patient re-educated about  the importance of commitment to a  minimum of 150 minutes of exercise per week.  The importance of healthy food choices with portion control discussed. Encouraged to start a food diary, count calories and to consider  joining a support group. Sample diet sheets offered. Goals set by the patient for the next several months.   Weight /BMI 03/23/2015 01/18/2015 10/11/2014  WEIGHT 197 lb 201 lb 198 lb 1.9 oz  HEIGHT 5\' 3"  5\' 3"  5\' 3"   BMI 34.91 kg/m2 35.61 kg/m2 35.1 kg/m2    Current exercise per week 90 minutes.

## 2015-04-09 NOTE — Assessment & Plan Note (Signed)
Judicious use of ibuprofen for flares in knee, neck and back pain

## 2015-04-09 NOTE — Assessment & Plan Note (Signed)
Symptomatic treatment with mouthwash

## 2015-04-09 NOTE — Assessment & Plan Note (Signed)
Uncontrolled, with chronic hoarseness and sore throat, start daily PPI

## 2015-04-09 NOTE — Assessment & Plan Note (Signed)
Uncontrolled, daily use of allergy medications to start, nasonex added

## 2015-04-11 ENCOUNTER — Telehealth: Payer: Self-pay | Admitting: Family Medicine

## 2015-04-11 ENCOUNTER — Other Ambulatory Visit: Payer: Self-pay | Admitting: Family Medicine

## 2015-04-11 MED ORDER — DIAZEPAM 2 MG PO TABS: ORAL_TABLET | ORAL | Status: DC

## 2015-04-11 NOTE — Telephone Encounter (Signed)
Requesting a call back from the nurse

## 2015-04-11 NOTE — Telephone Encounter (Signed)
pls ex[plain that i ma sorry to hear of her stress. She may nmeed to see therapist, refer if she agrees. STOP xanax, Limited amount of valium prescribed

## 2015-04-11 NOTE — Telephone Encounter (Signed)
Left message for patient and sent over the medication

## 2015-04-11 NOTE — Telephone Encounter (Signed)
States she is going through so much and its causing her so much anxiety. Her son is going to [place] and her mother is very sick and she started crying on the phone and states she cannot take xanax and muscle relaxer together because it will knock her out so she was wanting to know if you can prescribe her some valium short term. Said just temporary because she needs to rest and relax very badly. Please advise

## 2015-04-12 ENCOUNTER — Other Ambulatory Visit: Payer: Self-pay

## 2015-04-12 MED ORDER — HYDROCODONE-ACETAMINOPHEN 7.5-325 MG PO TABS: ORAL_TABLET | ORAL | Status: DC

## 2015-05-02 ENCOUNTER — Other Ambulatory Visit: Payer: Self-pay

## 2015-05-02 ENCOUNTER — Telehealth: Payer: Self-pay | Admitting: Family Medicine

## 2015-05-02 MED ORDER — FLUCONAZOLE 150 MG PO TABS: 150.0000 mg | ORAL_TABLET | Freq: Once | ORAL | Status: DC

## 2015-05-02 NOTE — Telephone Encounter (Signed)
Patient left a message on voicemail that she would like to speak to a nurse about a personal matter, please advise?

## 2015-05-02 NOTE — Telephone Encounter (Signed)
Patient is having vaginal discharge.  Vaginal itching.  Thinks that she does have a yeast infection after recent ABT.   Will send in Diflucan.  Patient will call back for an appointment if medication is ineffective.

## 2015-05-10 ENCOUNTER — Ambulatory Visit: Payer: Managed Care, Other (non HMO) | Admitting: Family Medicine

## 2015-05-23 ENCOUNTER — Ambulatory Visit (INDEPENDENT_AMBULATORY_CARE_PROVIDER_SITE_OTHER): Payer: Managed Care, Other (non HMO) | Admitting: Family Medicine

## 2015-05-23 ENCOUNTER — Encounter: Payer: Self-pay | Admitting: Family Medicine

## 2015-05-23 VITALS — BP 118/64 | HR 78 | Resp 18 | Ht 63.0 in | Wt 203.0 lb

## 2015-05-23 DIAGNOSIS — K219 Gastro-esophageal reflux disease without esophagitis: Secondary | ICD-10-CM

## 2015-05-23 DIAGNOSIS — M25562 Pain in left knee: Secondary | ICD-10-CM | POA: Diagnosis not present

## 2015-05-23 DIAGNOSIS — M25561 Pain in right knee: Secondary | ICD-10-CM | POA: Insufficient documentation

## 2015-05-23 DIAGNOSIS — E669 Obesity, unspecified: Secondary | ICD-10-CM | POA: Diagnosis not present

## 2015-05-23 DIAGNOSIS — Z23 Encounter for immunization: Secondary | ICD-10-CM | POA: Diagnosis not present

## 2015-05-23 MED ORDER — METHYLPREDNISOLONE ACETATE 80 MG/ML IJ SUSP
80.0000 mg | Freq: Once | INTRAMUSCULAR | Status: AC
  Administered 2015-05-23: 80 mg via INTRAMUSCULAR

## 2015-05-23 MED ORDER — IBUPROFEN 800 MG PO TABS: 800.0000 mg | ORAL_TABLET | Freq: Three times a day (TID) | ORAL | Status: DC | PRN

## 2015-05-23 MED ORDER — PHENTERMINE HCL 37.5 MG PO TABS: 37.5000 mg | ORAL_TABLET | Freq: Every day | ORAL | Status: DC

## 2015-05-23 MED ORDER — KETOROLAC TROMETHAMINE 60 MG/2ML IM SOLN
60.0000 mg | Freq: Once | INTRAMUSCULAR | Status: AC
  Administered 2015-05-23: 60 mg via INTRAMUSCULAR

## 2015-05-23 MED ORDER — PREDNISONE 5 MG PO TABS: 5.0000 mg | ORAL_TABLET | Freq: Two times a day (BID) | ORAL | Status: DC

## 2015-05-23 NOTE — Assessment & Plan Note (Signed)
,  Controlled, no change in medication  

## 2015-05-23 NOTE — Assessment & Plan Note (Signed)
Uncontrolled.Toradol and depo medrol administered IM in the office , to be followed by a short course of oral prednisone and NSAIDS.  

## 2015-05-23 NOTE — Assessment & Plan Note (Signed)
Deteriorated. Patient re-educated about  the importance of commitment to a  minimum of 150 minutes of exercise per week.  The importance of healthy food choices with portion control discussed. Encouraged to start a food diary, count calories and to consider  joining a support group. Sample diet sheets offered. Goals set by the patient for the next several months.   Weight /BMI 05/23/2015 03/23/2015 01/18/2015  WEIGHT 203 lb 197 lb 201 lb  HEIGHT 5\' 3"  5\' 3"  5\' 3"   BMI 35.97 kg/m2 34.91 kg/m2 35.61 kg/m2    Current exercise per week 40 minutes. Start phentermine half daily

## 2015-05-23 NOTE — Progress Notes (Signed)
   Subjective:    Patient ID: Anna Cantu, female    DOB: January 02, 1966, 49 y.o.   MRN: [id number]  HPI 1 week h/o increased and uncontrolled bilateral knee pain , rigth worse than left Tried to dance at a wedding last week, after 2 songs, she had to stop, since then limping more, unable to sleep due to pain and now also has pain under ball of right foot. Had hesitated re injections in joint by ortho but now sees may be her best option Weigth fluctuates, currently up, wants to get help from phentermine again so that her arthritic pain will improve, will follow 1200 cal diet also and exerciose as able, water and bike recommended Also c/o pain under ball of right foot x 1 week   Review of Systems See HPI Denies recent fever or chills. Denies sinus pressure, nasal congestion, ear pain or sore throat. Denies chest congestion, productive cough or wheezing. Denies chest pains, palpitations and leg swelling Denies abdominal pain, nausea, vomiting,diarrhea or constipation.   Denies dysuria, frequency, hesitancy or incontinence. Denies headaches, seizures, numbness, or tingling. Denies depression, anxiety or insomnia. Denies skin break down or rash.        Objective:   Physical Exam  BP 118/64 mmHg  Pulse 78  Resp 18  Ht 5\' 3"  (1.6 m)  Wt 203 lb (92.08 kg)  BMI 35.97 kg/m2  SpO2 99%  LMP 07/01/2013 Patient alert and oriented and in no cardiopulmonary distress.Pt in pain.  HEENT: No facial asymmetry, EOMI,   oropharynx pink and moist.  Neck supple no JVD, no mass.  Chest: Clear to auscultation bilaterally.  CVS: S1, S2 no murmurs, no S3.Regular rate.  ABD: Soft non tender.   Ext: No edema  MS: Adequate ROM spine, shoulders, hips and reduced in  Knees with palpable crepitus  Skin: Intact, no ulcerations or rash noted.  Psych: Good eye contact, normal affect. Memory intact not anxious or depressed appearing.  CNS: CN 2-12 intact, power,  normal throughout.no focal  deficits noted.       Assessment & Plan:  Left knee pain Uncontrolled.Toradol and depo medrol administered IM in the office , to be followed by a short course of oral prednisone and NSAIDS.   Obesity (BMI 35.0-39.9 without comorbidity) Deteriorated. Patient re-educated about  the importance of commitment to a  minimum of 150 minutes of exercise per week.  The importance of healthy food choices with portion control discussed. Encouraged to start a food diary, count calories and to consider  joining a support group. Sample diet sheets offered. Goals set by the patient for the next several months.   Weight /BMI 05/23/2015 03/23/2015 01/18/2015  WEIGHT 203 lb 197 lb 201 lb  HEIGHT 5\' 3"  5\' 3"  5\' 3"   BMI 35.97 kg/m2 34.91 kg/m2 35.61 kg/m2    Current exercise per week 40 minutes. Start phentermine half daily   GERD (gastroesophageal reflux disease) ,Controlled, no change in medication

## 2015-05-23 NOTE — Patient Instructions (Addendum)
F/u in 3.5 month, call if you need me sooner Flu vaccine today  Injections in office and medications sent for knee pain, ibuprofen and prednisone, if not improved, please contact ortho in [place] , Dr Layne Benton, as you will likely benefit from the injections she ahd discussed with you  Earlier this year  1200 cal diet sheet from office and start phentermine half daily  To lose weight you need to commit to eating regularly and on a schedule, also eat MAINLY vegetables, smaller portions of meat and starch and avoid sweets, cakes, and candy  All the best for 2017!  Thanks for choosing [facility], we consider it a privelige to serve you.

## 2015-05-24 ENCOUNTER — Telehealth: Payer: Self-pay | Admitting: Family Medicine

## 2015-05-24 NOTE — Telephone Encounter (Signed)
States she has been taking 2 of the hydrocodone since last Tuesday when she danced at a wedding and now her back and knees have been hurting her very bad. Has about 10 pills left. Wants to know if she can have a refill when she runs out and a temporary increase to 2 daily. Rates knee and back pain at a 10.

## 2015-05-24 NOTE — Telephone Encounter (Signed)
Patient is asking for a refill on HYDROcodone-acetaminophen (NORCO) 7.5-325 MG tablet and also to increase the dosage she is taking 2 a day for pain, please advise?

## 2015-05-25 MED ORDER — HYDROCODONE-ACETAMINOPHEN 7.5-325 MG PO TABS: ORAL_TABLET | ORAL | Status: DC

## 2015-05-25 NOTE — Telephone Encounter (Signed)
Pls print 21 day supply starting today for twice daily, she should see ortho within next 3 weeks ONLY so this can be taken care of appropriately, iIwill sign   Explain the INTENT for ortho to manage the problem clearly pls

## 2015-05-25 NOTE — Telephone Encounter (Signed)
Called patient and left message for them to return call at the office   

## 2015-05-25 NOTE — Addendum Note (Signed)
Addended by: Eual Fines on: 05/25/2015 01:47 PM   Modules accepted: Orders

## 2015-05-25 NOTE — Telephone Encounter (Signed)
Patient aware and states she already has an appt with ortho the first week in jan and I explained it was important to keep the appt because they would need to take over prescribing her pain meds because these would only last 3 weeks. Patient understands

## 2015-05-31 ENCOUNTER — Telehealth: Payer: Self-pay | Admitting: Family Medicine

## 2015-05-31 ENCOUNTER — Other Ambulatory Visit: Payer: Self-pay | Admitting: Family Medicine

## 2015-05-31 DIAGNOSIS — M5410 Radiculopathy, site unspecified: Secondary | ICD-10-CM

## 2015-05-31 NOTE — Telephone Encounter (Signed)
Patient is calling asking for [facility]

## 2015-06-07 NOTE — Addendum Note (Signed)
Addended by: Eual Fines on: 06/07/2015 01:41 PM   Modules accepted: Orders

## 2015-06-07 NOTE — Telephone Encounter (Signed)
States that she was told by ortho that pain is nerve related from back.   Is having gel injections in both knees next week.  But still having sharp pain that shoots down right left from back and causing her to have a hard time walking.

## 2015-06-07 NOTE — Telephone Encounter (Signed)
Refer for epidural Dr Lyla Son is nearest, see if wants sooner appt in [place] and enter as such   ?? pls ask

## 2015-06-07 NOTE — Telephone Encounter (Signed)
Pt aware, referral entered 

## 2015-06-23 ENCOUNTER — Other Ambulatory Visit: Payer: Self-pay

## 2015-06-23 MED ORDER — HYDROCODONE-ACETAMINOPHEN 7.5-325 MG PO TABS: ORAL_TABLET | ORAL | Status: DC

## 2015-07-03 ENCOUNTER — Other Ambulatory Visit: Payer: Self-pay | Admitting: Family Medicine

## 2015-07-11 ENCOUNTER — Encounter: Payer: Managed Care, Other (non HMO) | Admitting: Family Medicine

## 2015-07-17 ENCOUNTER — Other Ambulatory Visit: Payer: Self-pay

## 2015-07-17 MED ORDER — METHOCARBAMOL 750 MG PO TABS: 750.0000 mg | ORAL_TABLET | Freq: Every evening | ORAL | Status: DC | PRN

## 2015-07-18 ENCOUNTER — Telehealth: Payer: Self-pay

## 2015-07-18 NOTE — Telephone Encounter (Signed)
pls document duration and severity of current flare and radiaition,  Maty get toradol 60 gm and depo medrol 80 mg IM

## 2015-07-19 ENCOUNTER — Ambulatory Visit (INDEPENDENT_AMBULATORY_CARE_PROVIDER_SITE_OTHER): Payer: Managed Care, Other (non HMO)

## 2015-07-19 ENCOUNTER — Other Ambulatory Visit: Payer: Self-pay | Admitting: Family Medicine

## 2015-07-19 VITALS — Wt 199.0 lb

## 2015-07-19 DIAGNOSIS — M5441 Lumbago with sciatica, right side: Secondary | ICD-10-CM | POA: Diagnosis not present

## 2015-07-19 MED ORDER — DICLOFENAC SODIUM 1 % TD GEL: 2.0000 g | Freq: Four times a day (QID) | TRANSDERMAL | Status: DC

## 2015-07-19 MED ORDER — METHYLPREDNISOLONE ACETATE 80 MG/ML IJ SUSP
80.0000 mg | Freq: Once | INTRAMUSCULAR | Status: AC
  Administered 2015-07-19: 80 mg via INTRAMUSCULAR

## 2015-07-19 MED ORDER — KETOROLAC TROMETHAMINE 60 MG/2ML IM SOLN
60.0000 mg | Freq: Once | INTRAMUSCULAR | Status: AC
  Administered 2015-07-19: 60 mg via INTRAMUSCULAR

## 2015-07-19 NOTE — Telephone Encounter (Signed)
Tell her yES ,it is VERY GOOD, and has been sent in

## 2015-07-19 NOTE — Progress Notes (Signed)
Back pain has flared up for the past month and is getting worse. Pain going into her right leg. Hurting to stand up straight. Ok to get toradol and depo medrol per dr. Injections given with no complications

## 2015-07-19 NOTE — Telephone Encounter (Signed)
Daily getting worse x 1 month. Rates pain at a 10/10. Coming today for injections- wants to know if she can get an rx for voltaren gel for her knees and other places that ache? Someone told her it was really good and worked for them. Please advise

## 2015-07-21 ENCOUNTER — Other Ambulatory Visit: Payer: Self-pay

## 2015-07-21 MED ORDER — HYDROCODONE-ACETAMINOPHEN 7.5-325 MG PO TABS: ORAL_TABLET | ORAL | Status: DC

## 2015-07-24 ENCOUNTER — Telehealth: Payer: Self-pay

## 2015-07-24 MED ORDER — OSELTAMIVIR PHOSPHATE 75 MG PO CAPS: 75.0000 mg | ORAL_CAPSULE | Freq: Two times a day (BID) | ORAL | Status: DC

## 2015-07-24 NOTE — Telephone Encounter (Signed)
Noted and Tamiflu sent to pharmacy.  Voicemail left for patient notifying.

## 2015-07-24 NOTE — Telephone Encounter (Signed)
Acute onset of fever Yes.   Headache Yes.   Body ache Yes.   Fatigue Yes.   Non productive cough Yes.   Sore throat Yes.   Nasal discharge Yes.    Onset between 1-4 days of possible exposure Yes.    Recommendation:  Fluid, rest, Tylenol for control of fever  Expect 5 days before feeling better and not so contagious and generally better in 7-10 days.  Standard treatment Tama flu 75 mg 1 tablet twice daily times 5 days  Please call office if symptoms do not improve or worsen in 5 days after beginning treatment.  Patient will call back if she needs work excuse.

## 2015-07-24 NOTE — Telephone Encounter (Signed)
Pls send tamiflu and carry out std protocol, I agree

## 2015-07-31 ENCOUNTER — Ambulatory Visit: Payer: Managed Care, Other (non HMO) | Admitting: Family Medicine

## 2015-08-01 ENCOUNTER — Telehealth: Payer: Self-pay | Admitting: Family Medicine

## 2015-08-01 ENCOUNTER — Ambulatory Visit: Payer: Managed Care, Other (non HMO) | Admitting: Family Medicine

## 2015-08-01 NOTE — Telephone Encounter (Signed)
Nothing to add, tylenol recommended and glucosamine helps some people

## 2015-08-01 NOTE — Telephone Encounter (Signed)
Patient is asking for a returned call advising her of any natural remedies for arthritis pain, she is on her way now to [facility] to pick up what she already knows

## 2015-08-01 NOTE — Telephone Encounter (Signed)
See pts question below. Cancelled appt that was today at 3:30.

## 2015-08-01 NOTE — Telephone Encounter (Signed)
Patient aware.

## 2015-08-21 ENCOUNTER — Ambulatory Visit: Payer: Managed Care, Other (non HMO) | Admitting: Family Medicine

## 2015-08-28 ENCOUNTER — Encounter: Payer: Self-pay | Admitting: Family Medicine

## 2015-08-28 ENCOUNTER — Other Ambulatory Visit (HOSPITAL_COMMUNITY)
Admission: RE | Admit: 2015-08-28 | Discharge: 2015-08-28 | Disposition: A | Discharge status: Home / Self Care | Payer: Managed Care, Other (non HMO) | Source: Ambulatory Visit | Attending: Family Medicine | Admitting: Family Medicine

## 2015-08-28 ENCOUNTER — Ambulatory Visit (INDEPENDENT_AMBULATORY_CARE_PROVIDER_SITE_OTHER): Payer: Managed Care, Other (non HMO) | Admitting: Family Medicine

## 2015-08-28 VITALS — BP 126/80 | HR 84 | Resp 18 | Ht 63.0 in | Wt 195.0 lb

## 2015-08-28 DIAGNOSIS — N760 Acute vaginitis: Secondary | ICD-10-CM | POA: Insufficient documentation

## 2015-08-28 DIAGNOSIS — F418 Other specified anxiety disorders: Secondary | ICD-10-CM

## 2015-08-28 DIAGNOSIS — Z124 Encounter for screening for malignant neoplasm of cervix: Secondary | ICD-10-CM

## 2015-08-28 DIAGNOSIS — M25562 Pain in left knee: Secondary | ICD-10-CM

## 2015-08-28 DIAGNOSIS — M5410 Radiculopathy, site unspecified: Secondary | ICD-10-CM

## 2015-08-28 DIAGNOSIS — Z113 Encounter for screening for infections with a predominantly sexual mode of transmission: Secondary | ICD-10-CM | POA: Diagnosis present

## 2015-08-28 DIAGNOSIS — F410 Panic disorder [episodic paroxysmal anxiety] without agoraphobia: Secondary | ICD-10-CM

## 2015-08-28 DIAGNOSIS — R350 Frequency of micturition: Secondary | ICD-10-CM

## 2015-08-28 DIAGNOSIS — Z1211 Encounter for screening for malignant neoplasm of colon: Secondary | ICD-10-CM

## 2015-08-28 DIAGNOSIS — J3089 Other allergic rhinitis: Secondary | ICD-10-CM

## 2015-08-28 DIAGNOSIS — M25561 Pain in right knee: Secondary | ICD-10-CM

## 2015-08-28 DIAGNOSIS — K219 Gastro-esophageal reflux disease without esophagitis: Secondary | ICD-10-CM

## 2015-08-28 DIAGNOSIS — R87810 Cervical high risk human papillomavirus (HPV) DNA test positive: Secondary | ICD-10-CM

## 2015-08-28 LAB — POCT URINALYSIS DIPSTICK
Bilirubin, UA: NEGATIVE
Blood, UA: NEGATIVE
Glucose, UA: NEGATIVE
KETONES UA: NEGATIVE
Leukocytes, UA: NEGATIVE
Nitrite, UA: NEGATIVE
PH UA: 6
PROTEIN UA: NEGATIVE
SPEC GRAV UA: 1.025
Urobilinogen, UA: 0.2

## 2015-08-28 LAB — POC HEMOCCULT BLD/STL (OFFICE/1-CARD/DIAGNOSTIC): Fecal Occult Blood, POC: NEGATIVE

## 2015-08-28 MED ORDER — LORAZEPAM 1 MG PO TABS: ORAL_TABLET | ORAL | Status: DC

## 2015-08-28 MED ORDER — PAROXETINE HCL 20 MG PO TABS: 20.0000 mg | ORAL_TABLET | Freq: Every day | ORAL | Status: DC

## 2015-08-28 NOTE — Patient Instructions (Addendum)
F/u in 8 weeks call if you need me sooner  New for anxiety and depression and hot flashes is paxil, and you are referred to therapist  No sinus or ear infection  Call and schedule epidural and ativan is prescribed

## 2015-08-31 LAB — CYTOLOGY - PAP

## 2015-08-31 LAB — CERVICOVAGINAL ANCILLARY ONLY: Wet Prep (BD Affirm): NEGATIVE

## 2015-09-01 ENCOUNTER — Other Ambulatory Visit: Payer: Self-pay

## 2015-09-01 MED ORDER — HYDROCODONE-ACETAMINOPHEN 7.5-325 MG PO TABS: ORAL_TABLET | ORAL | Status: DC

## 2015-09-09 DIAGNOSIS — N760 Acute vaginitis: Secondary | ICD-10-CM | POA: Insufficient documentation

## 2015-09-09 DIAGNOSIS — R350 Frequency of micturition: Secondary | ICD-10-CM | POA: Insufficient documentation

## 2015-09-09 NOTE — Assessment & Plan Note (Signed)
Controlled, no change in medication  

## 2015-09-09 NOTE — Assessment & Plan Note (Signed)
Increased and uncontrolled symptoms with Spring. Needs to commit to daily use of medications as directed

## 2015-09-09 NOTE — Assessment & Plan Note (Signed)
Repeat pap sent as due

## 2015-09-09 NOTE — Assessment & Plan Note (Addendum)
worsening and uncontrolled, epidural recommended for symptom relief, pt is to call and schedule , based on her availability, reports bad experience in past so very anxious, valium , one tablet prescribned for use prior to procedure

## 2015-09-09 NOTE — Assessment & Plan Note (Signed)
increased pain and instability, surgery recommended, however no interest at this time

## 2015-09-09 NOTE — Progress Notes (Signed)
Subjective:    Patient ID: Anna Cantu, female    DOB: 09-07-1965, 50 y.o.   MRN: [id number]  HPI   Anna Cantu     MRN: [id number]      DOB: May 11, 1966   HPI [name] is here for follow up and re-evaluation of chronic medical conditions, medication management and review of any available recent lab and radiology data.  Preventive health is updated, specifically  Cancer screening and Immunization.   Questions or concerns regarding consultations or procedures which the PT has had in the interim are  addressed. The PT denies any adverse reactions to current medications since the last visit.  C/o increased and uncontrolled back and knee pain C/o urinary frequency and malodorous vaginal discharge C/o anxiety and depression , not suicidal or homicidal ovcerwhelmed caring for her mom C/o increased nasall congestion with clear drianage  ROS Denies recent fever or chills. Denies sinus pressure, , ear pain or sore throat. Denies chest congestion, productive cough or wheezing. Denies chest pains, palpitations and leg swelling Denies abdominal pain, nausea, vomiting,diarrhea or constipation.   Denies headaches, seizures, numbness, or tingling. Denies skin break down or rash.   PE  BP 126/80 mmHg  Pulse 84  Resp 18  Ht 5\' 3"  (1.6 m)  Wt 195 lb 0.6 oz (88.47 kg)  BMI 34.56 kg/m2  SpO2 99%  LMP 07/01/2013  Patient alert and oriented and in no cardiopulmonary distress.  HEENT: No facial asymmetry, EOMI,   oropharynx pink and moist.  Neck supple no JVD, no mass.  Chest: Clear to auscultation bilaterally.  CVS: S1, S2 no murmurs, no S3.Regular rate.  ABD: Soft mild suprapubic tenderness, no renal angle tenderness, no guarding or rebound.  Rectal: no mass, heme negative stool  Pelvic: Normal  Ext genitalia, normal distribution of hair, no skin breakdown or ulcers. Vaginal walls pink and moist, cervix firm, no discharge from os, no cervical motion or  adnexal tenderness. No adnexal mass, uterus normal size. Physiologic discharge which is not malodorous Ext: No edema  MS: decreased e ROM spine,  and knees.  Skin: Intact, no ulcerations or rash noted.  Psych: Good eye contact, tearful affect. Memory intact  anxious and  depressed appearing.  CNS: CN 2-12 intact, power,  normal throughout.no focal deficits noted.   Assessment & Plan   Panic anxiety syndrome Uncontrolled and worsening due to need to care for chronically ill Mother with mental health illness who refuses to take medication, tearful , depressed, anxious and fearful.  Start paxil and refer for therapy, which she will benefit from. Not suicidal or homicidal  Cervical high risk HPV (human papillomavirus) test positive Repeat pap sent as due  Back pain with left-sided radiculopathy worsening and uncontrolled, epidural recommended for symptom relief, pt is to call and schedule , based on her availability, reports bad experience in past so very anxious, valium , one tablet prescribned for use prior to procedure  GERD (gastroesophageal reflux disease) Controlled, no change in medication   Allergic rhinitis Increased and uncontrolled symptoms with Spring. Needs to commit to daily use of medications as directed  Vaginitis and vulvovaginitis Symptomatic, specimens sent , will treat based on result, exam is  normal  Arthralgia of both knees increased pain and instability, surgery recommended, however no interest at this time  Urinary frequency Urinalysis in office is normal       Review of Systems     Objective:   Physical Exam  Assessment & Plan:

## 2015-09-09 NOTE — Assessment & Plan Note (Signed)
Uncontrolled and worsening due to need to care for chronically ill Mother with mental health illness who refuses to take medication, tearful , depressed, anxious and fearful.  Start paxil and refer for therapy, which she will benefit from. Not suicidal or homicidal

## 2015-09-09 NOTE — Assessment & Plan Note (Signed)
Symptomatic, specimens sent , will treat based on result, exam is  normal

## 2015-09-09 NOTE — Assessment & Plan Note (Signed)
Urinalysis in office is normal 

## 2015-09-11 ENCOUNTER — Telehealth: Payer: Self-pay | Admitting: Family Medicine

## 2015-09-11 NOTE — Telephone Encounter (Signed)
Araly left a voicemail stating she was under the impression Dr.Simpson was going to call her in something for Hot Flashes, please advise?

## 2015-09-11 NOTE — Telephone Encounter (Signed)
The paxil prescribed is for hot flashes, remind her of coool drink, light clothing and fanning also pls

## 2015-09-12 ENCOUNTER — Ambulatory Visit (INDEPENDENT_AMBULATORY_CARE_PROVIDER_SITE_OTHER): Payer: Managed Care, Other (non HMO)

## 2015-09-12 ENCOUNTER — Telehealth: Payer: Self-pay

## 2015-09-12 DIAGNOSIS — M549 Dorsalgia, unspecified: Secondary | ICD-10-CM | POA: Diagnosis not present

## 2015-09-12 DIAGNOSIS — M5440 Lumbago with sciatica, unspecified side: Secondary | ICD-10-CM

## 2015-09-12 MED ORDER — PREDNISONE 5 MG PO TABS: 5.0000 mg | ORAL_TABLET | Freq: Two times a day (BID) | ORAL | Status: AC

## 2015-09-12 MED ORDER — KETOROLAC TROMETHAMINE 60 MG/2ML IM SOLN
60.0000 mg | Freq: Once | INTRAMUSCULAR | Status: AC
  Administered 2015-09-12: 60 mg via INTRAMUSCULAR

## 2015-09-12 MED ORDER — METHYLPREDNISOLONE ACETATE 80 MG/ML IJ SUSP
80.0000 mg | Freq: Once | INTRAMUSCULAR | Status: AC
  Administered 2015-09-12: 80 mg via INTRAMUSCULAR

## 2015-09-12 NOTE — Telephone Encounter (Addendum)
I sent 5 days of prednisone to [facility], oK toradol 60 mg IM and depomedrol 80 mg iM pls in office  I have also referred her to Dr Rolena Infante (spine surgeon)

## 2015-09-12 NOTE — Telephone Encounter (Signed)
Called and left message for patient notifying that medication has been sent.

## 2015-09-12 NOTE — Telephone Encounter (Signed)
I have entered entered an urgent referral to Dr Nelva Bush, and I am also recommending she see a spine surgeon also due to lower ext weakness, I would refer her to Dr Rolena Infante who is in same office as Dr Nelva Bush, pls let me know if she agrees to both

## 2015-09-12 NOTE — Telephone Encounter (Signed)
Patient agrees with referrals.  Would like to know if she can get injections today.  Last injections were in Feb.

## 2015-09-12 NOTE — Addendum Note (Signed)
Addended by: [name] E on: 09/12/2015 02:21 PM   Modules accepted: Orders

## 2015-09-12 NOTE — Telephone Encounter (Signed)
Patient aware and in for nurse visit.

## 2015-09-12 NOTE — Progress Notes (Signed)
Patient in for injections for back pain.  Injections given as ordered.  No voiced complaints.  Referral information has been sent.   Patient also aware of Prednisone rx.

## 2015-09-14 ENCOUNTER — Telehealth: Payer: Self-pay | Admitting: Family Medicine

## 2015-09-14 ENCOUNTER — Other Ambulatory Visit: Payer: Self-pay

## 2015-09-14 DIAGNOSIS — F418 Other specified anxiety disorders: Secondary | ICD-10-CM

## 2015-09-14 MED ORDER — PAROXETINE HCL 20 MG PO TABS: 20.0000 mg | ORAL_TABLET | Freq: Every day | ORAL | Status: DC

## 2015-09-14 NOTE — Telephone Encounter (Signed)
Patient aware that Paxil was sent previously.  Med sent to pharmacy again.

## 2015-09-14 NOTE — Telephone Encounter (Signed)
Anna Cantu left a voicemail stating that she was supposed to have some medication called in and hasnt gotten it, please advise?

## 2015-09-19 ENCOUNTER — Other Ambulatory Visit: Payer: Self-pay

## 2015-09-19 ENCOUNTER — Telehealth: Payer: Self-pay

## 2015-09-19 MED ORDER — FLUCONAZOLE 150 MG PO TABS: 150.0000 mg | ORAL_TABLET | Freq: Once | ORAL | Status: DC

## 2015-09-19 NOTE — Telephone Encounter (Signed)
She is asking for Diflucan since she is on abt. Will also request ED summary. Diflucan sent to pharmacy.  Patient will call back with any further problems.

## 2015-09-20 ENCOUNTER — Telehealth (HOSPITAL_COMMUNITY): Payer: Self-pay | Admitting: *Deleted

## 2015-09-25 ENCOUNTER — Telehealth: Payer: Self-pay | Admitting: Family Medicine

## 2015-09-25 DIAGNOSIS — R05 Cough: Secondary | ICD-10-CM

## 2015-09-25 DIAGNOSIS — R059 Cough, unspecified: Secondary | ICD-10-CM

## 2015-09-25 NOTE — Telephone Encounter (Signed)
Need sputum for c/s and CXR, no additional antibiotic without data, also order CBc and diff

## 2015-09-25 NOTE — Telephone Encounter (Signed)
Patient is asking for advise as to what she can take for congestion and cough, please advise?

## 2015-09-25 NOTE — Telephone Encounter (Signed)
Will come in the am

## 2015-09-25 NOTE — Telephone Encounter (Signed)
Just finished a zpak and still coughing with yellowmucus and running a low grade temp. Recommended tylenol and robitussin and but wants something else sent in and also has some mucinex and wants to know if its ok to take. Please advise

## 2015-09-26 NOTE — Telephone Encounter (Signed)
Pt came to collect

## 2015-09-26 NOTE — Addendum Note (Signed)
Addended by: Eual Fines on: 09/26/2015 04:03 PM   Modules accepted: Orders

## 2015-09-27 ENCOUNTER — Telehealth: Payer: Self-pay | Admitting: Family Medicine

## 2015-09-27 LAB — CBC WITH DIFFERENTIAL/PLATELET
BASOS PCT: 0 %
Basophils Absolute: 0 cells/uL (ref 0–200)
EOS PCT: 1 %
Eosinophils Absolute: 110 cells/uL (ref 15–500)
HCT: 39.2 % (ref 35.0–45.0)
Hemoglobin: 13 g/dL (ref 11.7–15.5)
LYMPHS PCT: 44 %
Lymphs Abs: 4840 cells/uL — ABNORMAL HIGH (ref 850–3900)
MCH: 31 pg (ref 27.0–33.0)
MCHC: 33.2 g/dL (ref 32.0–36.0)
MCV: 93.3 fL (ref 80.0–100.0)
MONOS PCT: 4 %
MPV: 9.4 fL (ref 7.5–12.5)
Monocytes Absolute: 440 cells/uL (ref 200–950)
NEUTROS ABS: 5610 {cells}/uL (ref 1500–7800)
Neutrophils Relative %: 51 %
PLATELETS: 369 10*3/uL (ref 140–400)
RBC: 4.2 MIL/uL (ref 3.80–5.10)
RDW: 13.7 % (ref 11.0–15.0)
WBC: 11 10*3/uL — AB (ref 3.8–10.8)

## 2015-09-27 MED ORDER — ONDANSETRON HCL 4 MG PO TABS: 4.0000 mg | ORAL_TABLET | Freq: Three times a day (TID) | ORAL | Status: DC | PRN

## 2015-09-27 MED ORDER — DICYCLOMINE HCL 10 MG PO CAPS: 10.0000 mg | ORAL_CAPSULE | Freq: Three times a day (TID) | ORAL | Status: DC

## 2015-09-27 NOTE — Telephone Encounter (Signed)
Need documentation of symptoms, duratin etc pls

## 2015-09-27 NOTE — Addendum Note (Signed)
Addended by: [name] E on: 09/27/2015 12:31 PM   Modules accepted: Orders

## 2015-09-27 NOTE — Telephone Encounter (Signed)
Patient is calling asking for a Rx for nausea, please advise?

## 2015-09-27 NOTE — Telephone Encounter (Signed)
Patient states that she has had nausea without vomiting x 2 weeks.  Loss of appetite.  She had loose stools on yesterday which has resolved today.  Please advise.  Does also complain of stomach cramping.

## 2015-09-27 NOTE — Telephone Encounter (Signed)
Dicyclomine and zofran prescribe pls let her know I recommend GI eval due to 2 week history, pls refer if she agrees, thanks, I will sign

## 2015-09-27 NOTE — Telephone Encounter (Signed)
Please advise of quantity of Zofran that can be sent.

## 2015-09-28 ENCOUNTER — Telehealth: Payer: Self-pay | Admitting: Family Medicine

## 2015-09-28 NOTE — Telephone Encounter (Signed)
Patient aware.  Does not want referral at this time but would like to try medication 1st.  Will call back if she decides on referral.

## 2015-09-28 NOTE — Telephone Encounter (Signed)
Patient states that she is having a lot of pain in the back around the kidney area, pressure in the bladder area, not sure if she has Possible UTI please advise?

## 2015-09-29 LAB — RESPIRATORY CULTURE OR RESPIRATORY AND SPUTUM CULTURE

## 2015-10-02 ENCOUNTER — Other Ambulatory Visit: Payer: Self-pay | Admitting: Family Medicine

## 2015-10-02 DIAGNOSIS — J418 Mixed simple and mucopurulent chronic bronchitis: Secondary | ICD-10-CM

## 2015-10-02 MED ORDER — PROMETHAZINE-DM 6.25-15 MG/5ML PO SYRP: ORAL_SOLUTION | ORAL | Status: DC

## 2015-10-02 MED ORDER — MONTELUKAST SODIUM 10 MG PO TABS: 10.0000 mg | ORAL_TABLET | Freq: Every day | ORAL | Status: DC

## 2015-10-02 NOTE — Telephone Encounter (Signed)
Still coughing, mainly at night, some what dry but when she does have sputum the sputum has lightened up in color, slightly beige and a little thick. Took a mucinex today and robitussin last night. Wants to know if she needs anything else> Please advise

## 2015-10-02 NOTE — Telephone Encounter (Signed)
I have sent in daily singulair dfor allergies as cough worse at night suggests this, also phenergan dM as cough suppressant Since she has not had a CXR in over 2 years and continues to reports cough, CXR also ordered

## 2015-10-02 NOTE — Telephone Encounter (Signed)
Patient aware.

## 2015-10-06 ENCOUNTER — Other Ambulatory Visit: Payer: Self-pay

## 2015-10-06 MED ORDER — HYDROCODONE-ACETAMINOPHEN 7.5-325 MG PO TABS: ORAL_TABLET | ORAL | Status: DC

## 2015-10-17 ENCOUNTER — Ambulatory Visit (INDEPENDENT_AMBULATORY_CARE_PROVIDER_SITE_OTHER): Payer: Managed Care, Other (non HMO) | Admitting: Family Medicine

## 2015-10-17 ENCOUNTER — Encounter: Payer: Self-pay | Admitting: Family Medicine

## 2015-10-17 VITALS — BP 128/76 | HR 68 | Resp 16 | Ht 63.0 in | Wt 195.0 lb

## 2015-10-17 DIAGNOSIS — M5410 Radiculopathy, site unspecified: Secondary | ICD-10-CM

## 2015-10-17 DIAGNOSIS — E669 Obesity, unspecified: Secondary | ICD-10-CM

## 2015-10-17 DIAGNOSIS — Z13220 Encounter for screening for lipoid disorders: Secondary | ICD-10-CM

## 2015-10-17 DIAGNOSIS — E559 Vitamin D deficiency, unspecified: Secondary | ICD-10-CM

## 2015-10-17 DIAGNOSIS — G44219 Episodic tension-type headache, not intractable: Secondary | ICD-10-CM

## 2015-10-17 DIAGNOSIS — J3089 Other allergic rhinitis: Secondary | ICD-10-CM

## 2015-10-17 DIAGNOSIS — M25562 Pain in left knee: Secondary | ICD-10-CM

## 2015-10-17 MED ORDER — KETOROLAC TROMETHAMINE 60 MG/2ML IM SOLN
60.0000 mg | Freq: Once | INTRAMUSCULAR | Status: AC
  Administered 2015-10-17: 60 mg via INTRAMUSCULAR

## 2015-10-17 MED ORDER — IBUPROFEN 800 MG PO TABS: ORAL_TABLET | ORAL | Status: DC

## 2015-10-17 MED ORDER — DICLOFENAC SODIUM 1 % TD GEL: 2.0000 g | Freq: Four times a day (QID) | TRANSDERMAL | Status: DC

## 2015-10-17 MED ORDER — GABAPENTIN 100 MG PO CAPS: 100.0000 mg | ORAL_CAPSULE | Freq: Every day | ORAL | Status: DC

## 2015-10-17 NOTE — Patient Instructions (Signed)
F/u early September , call if you need me sooner  Toradol today for arthritis pain  New daily medication for arthritis pain is ibuprofen one daily, gabapentin one at bedtime and use voltaren gel as needed  Continue hydrocodone as at present dose, the hope is that the dose will be reduced with your f/u on chronic management above, weight loss, daily exercises, and call ortho for injections needed in Knee  I will send a note to Dr Nelva Bush, re expressed concern  Fasting labs end August

## 2015-10-17 NOTE — Progress Notes (Signed)
   Subjective:    Patient ID: Anna Cantu, female    DOB: February 09, 1966, 50 y.o.   MRN: [id number]  HPI   Anna Cantu     MRN: [id number]      DOB: [birth year]   HPI Ms. Wassmer is here for follow up and re-evaluation of chronic medical conditions, medication management and review of any available recent lab and radiology data.  Preventive health is updated, specifically  Cancer screening and Immunization.   Questions or concerns regarding consultations or procedures which the PT has had in the interim are  addressed. The PT denies any adverse reactions to current medications since the last visit.  C/o uncontrolled back pain and headache , also left ear pain Fearful of epidural states it was a very painful experience in the past  ROS Denies recent fever or chills. Denies sinus pressure, nasal congestion,  or sore throat. Denies chest congestion, productive cough or wheezing. Denies chest pains, palpitations and leg swelling Denies abdominal pain, nausea, vomiting,diarrhea or constipation.   Denies dysuria, frequency, hesitancy or incontinence. . Denies depression does have  anxiety or insomnia. Denies skin break down or rash.   PE  BP 128/76 mmHg  Pulse 68  Resp 16  Ht 5\' 3"  (1.6 m)  Wt 195 lb (88.451 kg)  BMI 34.55 kg/m2  SpO2 100%  LMP 07/01/2013  Patient alert and oriented and in no cardiopulmonary distress.  HEENT: No facial asymmetry, EOMI,   oropharynx pink and moist.  Neck supple no JVD, no mass. TM clear bilaterally Chest: Clear to auscultation bilaterally.  CVS: S1, S2 no murmurs, no S3.Regular rate.  ABD: Soft non tender.   Ext: No edema  MS: decreased  ROM spine,  and knees.  Skin: Intact, no ulcerations or rash noted.  Psych: Good eye contact, normal affect. Memory intact not anxious or depressed appearing.  CNS: CN 2-12 intact, power,  normal throughout.no focal deficits noted.   Assessment & Plan  Back pain with  left-sided radiculopathy toradol in office and pt given standard pain med regime to follow routinely for better control, also to see pain specialist, she had sxcary experience in the past with epidurals , bur I believe she will benefit  Obesity (BMI 35.0-39.9 without comorbidity) Deteriorated. Patient re-educated about  the importance of commitment to a  minimum of 150 minutes of exercise per week.  The importance of healthy food choices with portion control discussed. Encouraged to start a food diary, count calories and to consider  joining a support group. Sample diet sheets offered. Goals set by the patient for the next several months.   Weight /BMI 10/17/2015 08/28/2015 07/19/2015  WEIGHT 195 lb 195 lb 0.6 oz 199 lb  HEIGHT 5\' 3"  5\' 3"  -  BMI 34.55 kg/m2 34.56 kg/m2 35.26 kg/m2    Current exercise per week 90 minutes.   Left knee pain Uncontrolled , needs to return to ortho for specific therapy  Allergic rhinitis Controlled, no change in medication   Headache Tension type headache , normal neuro exam, toradol in office and stress management discussed      Review of Systems     Objective:   Physical Exam        Assessment & Plan:

## 2015-10-26 NOTE — Assessment & Plan Note (Signed)
Deteriorated. Patient re-educated about  the importance of commitment to a  minimum of 150 minutes of exercise per week.  The importance of healthy food choices with portion control discussed. Encouraged to start a food diary, count calories and to consider  joining a support group. Sample diet sheets offered. Goals set by the patient for the next several months.   Weight /BMI 10/17/2015 08/28/2015 07/19/2015  WEIGHT 195 lb 195 lb 0.6 oz 199 lb  HEIGHT 5\' 3"  5\' 3"  -  BMI 34.55 kg/m2 34.56 kg/m2 35.26 kg/m2    Current exercise per week 90 minutes.

## 2015-10-26 NOTE — Assessment & Plan Note (Signed)
Uncontrolled , needs to return to ortho for specific therapy

## 2015-10-26 NOTE — Assessment & Plan Note (Signed)
Tension type headache , normal neuro exam, toradol in office and stress management discussed

## 2015-10-26 NOTE — Assessment & Plan Note (Signed)
Controlled, no change in medication  

## 2015-10-26 NOTE — Assessment & Plan Note (Signed)
toradol in office and pt given standard pain med regime to follow routinely for better control, also to see pain specialist, she had sxcary experience in the past with epidurals , bur I believe she will benefit

## 2015-11-03 ENCOUNTER — Other Ambulatory Visit: Payer: Self-pay | Admitting: Family Medicine

## 2015-11-08 ENCOUNTER — Telehealth: Payer: Self-pay | Admitting: Family Medicine

## 2015-11-08 NOTE — Telephone Encounter (Signed)
Am unable to call Dr Nelva Bush re that question, she may get injections of toradol 60 mg IM and depo medrol 80 mg Im in this office if she does not expect to be able to have the money for the injection in the next 3 months, that will be a nurse visit here if she decides on this

## 2015-11-08 NOTE — Telephone Encounter (Signed)
Anna Cantu states that she cant have the procedure done Friday due to not being able to afford the Money due up front of $1000, she is askng if Dr. Moshe Cipro can call Dr. Nelva Bush and find out if she can get an injection in the office for pain, she stated that shes tried to reach the office but cant get anyone to call her back, please advise?

## 2015-11-08 NOTE — Telephone Encounter (Signed)
Please advise 

## 2015-11-09 NOTE — Telephone Encounter (Signed)
Patient aware and will come in on 6/16

## 2015-11-10 ENCOUNTER — Ambulatory Visit (INDEPENDENT_AMBULATORY_CARE_PROVIDER_SITE_OTHER): Payer: Managed Care, Other (non HMO)

## 2015-11-10 ENCOUNTER — Other Ambulatory Visit: Payer: Self-pay

## 2015-11-10 DIAGNOSIS — M5410 Radiculopathy, site unspecified: Secondary | ICD-10-CM

## 2015-11-10 DIAGNOSIS — M5440 Lumbago with sciatica, unspecified side: Secondary | ICD-10-CM | POA: Diagnosis not present

## 2015-11-10 MED ORDER — METHYLPREDNISOLONE ACETATE 80 MG/ML IJ SUSP
80.0000 mg | Freq: Once | INTRAMUSCULAR | Status: AC
  Administered 2015-11-10: 80 mg via INTRAMUSCULAR

## 2015-11-10 MED ORDER — KETOROLAC TROMETHAMINE 60 MG/2ML IM SOLN
60.0000 mg | Freq: Once | INTRAMUSCULAR | Status: AC
  Administered 2015-11-10: 60 mg via INTRAMUSCULAR

## 2015-11-10 MED ORDER — HYDROCODONE-ACETAMINOPHEN 7.5-325 MG PO TABS: ORAL_TABLET | ORAL | Status: DC

## 2015-11-10 NOTE — Progress Notes (Signed)
Back pain flare x a few days. Ok to get toradol and depo per Dr. Given with no complications

## 2015-11-14 ENCOUNTER — Telehealth: Payer: Self-pay | Admitting: Family Medicine

## 2015-11-14 NOTE — Telephone Encounter (Signed)
Anna Cantu is asking if there is anything naturally or anything with little side affects to help with the arthritis pain in her neck, please advise?

## 2015-11-14 NOTE — Telephone Encounter (Signed)
No, I do not, but physical therapy recommended   Insufficeint  Scientific evidence from what I know to show that herbal products do have benfit

## 2015-11-16 NOTE — Telephone Encounter (Signed)
Patient agrees and would like to go to PT with [facility] in [place].

## 2015-11-29 ENCOUNTER — Telehealth: Payer: Self-pay | Admitting: Family Medicine

## 2015-11-29 NOTE — Telephone Encounter (Signed)
Anna Cantu is c/o sharpe pain radiating from knee to ankle or around ankle or vice versa she cant tell/ she states she has been taking her nerve and muscle relaxer medication but has not stopped her pain, please advise?

## 2015-12-12 ENCOUNTER — Telehealth: Payer: Self-pay | Admitting: Family Medicine

## 2015-12-12 NOTE — Telephone Encounter (Signed)
Anna Cantu is calling stating that she is having a lot of leg pain now to the point that its hurting to walk, she is asking if she needs to take prednisone and or come in for another injection, please advise?

## 2015-12-13 NOTE — Telephone Encounter (Signed)
Patient aware.

## 2015-12-13 NOTE — Telephone Encounter (Signed)
No other options, already on medication chronically, I also recommend surgery if the medication is not sufficient and the knee is worsening

## 2015-12-13 NOTE — Telephone Encounter (Signed)
Patient states that knee pain is worsening.  Has seen ortho in the past and was told that she needs surgery but does not want to move forward with that option.   Was advised to see a chiropractor by neurosurgeon but elects to not do that as well.  Please advise if there is anything that can be prescribed by this office.

## 2015-12-13 NOTE — Telephone Encounter (Signed)
See next telephone message. 

## 2015-12-28 ENCOUNTER — Telehealth: Payer: Self-pay | Admitting: Family Medicine

## 2015-12-28 NOTE — Telephone Encounter (Signed)
Pain flare in her back and legs- just got back from the beach and rates her pain a 10. Wants injections and a refill of prednisone.  Last injections were 6/16. Ok to get both and pred refill?

## 2015-12-28 NOTE — Telephone Encounter (Signed)
Anna Cantu is asking if she can come in for an injection and/or have prednisone called in for her, please advise?

## 2015-12-29 ENCOUNTER — Ambulatory Visit (INDEPENDENT_AMBULATORY_CARE_PROVIDER_SITE_OTHER): Payer: Managed Care, Other (non HMO)

## 2015-12-29 ENCOUNTER — Encounter (INDEPENDENT_AMBULATORY_CARE_PROVIDER_SITE_OTHER): Payer: Self-pay

## 2015-12-29 DIAGNOSIS — M5410 Radiculopathy, site unspecified: Secondary | ICD-10-CM | POA: Diagnosis not present

## 2015-12-29 MED ORDER — PREDNISONE 5 MG (21) PO TBPK: ORAL_TABLET | ORAL | 0 refills | Status: DC

## 2015-12-29 MED ORDER — KETOROLAC TROMETHAMINE 60 MG/2ML IM SOLN
60.0000 mg | Freq: Once | INTRAMUSCULAR | Status: AC
  Administered 2015-12-29: 60 mg via INTRAMUSCULAR

## 2015-12-29 MED ORDER — METHYLPREDNISOLONE ACETATE 80 MG/ML IJ SUSP
80.0000 mg | Freq: Once | INTRAMUSCULAR | Status: AC
  Administered 2015-12-29: 80 mg via INTRAMUSCULAR

## 2015-12-29 NOTE — Telephone Encounter (Signed)
Ok toradol and depomedrol 60 mg and 80 mg IM , also send in prednisone dose pack 5 mg #21 onl;y pls

## 2015-12-29 NOTE — Telephone Encounter (Signed)
Patient coming this am.

## 2016-01-12 ENCOUNTER — Ambulatory Visit (INDEPENDENT_AMBULATORY_CARE_PROVIDER_SITE_OTHER): Payer: Managed Care, Other (non HMO) | Admitting: Family Medicine

## 2016-01-12 ENCOUNTER — Other Ambulatory Visit: Payer: Self-pay | Admitting: Family Medicine

## 2016-01-12 ENCOUNTER — Encounter: Payer: Self-pay | Admitting: Family Medicine

## 2016-01-12 VITALS — BP 122/80 | HR 78 | Temp 99.0°F | Resp 16 | Ht 64.0 in | Wt 193.0 lb

## 2016-01-12 DIAGNOSIS — J020 Streptococcal pharyngitis: Secondary | ICD-10-CM

## 2016-01-12 DIAGNOSIS — B370 Candidal stomatitis: Secondary | ICD-10-CM

## 2016-01-12 DIAGNOSIS — E669 Obesity, unspecified: Secondary | ICD-10-CM | POA: Diagnosis not present

## 2016-01-12 LAB — POCT RAPID STREP A (OFFICE): Rapid Strep A Screen: POSITIVE — AB

## 2016-01-12 MED ORDER — AZITHROMYCIN 250 MG PO TABS: ORAL_TABLET | ORAL | 0 refills | Status: DC

## 2016-01-12 MED ORDER — FIRST-DUKES MOUTHWASH MT SUSP: OROMUCOSAL | 0 refills | Status: DC

## 2016-01-12 MED ORDER — FLUCONAZOLE 150 MG PO TABS: ORAL_TABLET | ORAL | 0 refills | Status: DC

## 2016-01-12 NOTE — Assessment & Plan Note (Signed)
Improved. Pt applauded on succesful weight loss through lifestyle change, and encouraged to continue same. Weight loss goal set for the next several months.  

## 2016-01-12 NOTE — Assessment & Plan Note (Deleted)
Deteriorated. Patient re-educated about  the importance of commitment to a  minimum of 150 minutes of exercise per week.  The importance of healthy food choices with portion control discussed. Encouraged to start a food diary, count calories and to consider  joining a support group. Sample diet sheets offered. Goals set by the patient for the next several months.   Weight /BMI 01/12/2016 10/17/2015 08/28/2015  WEIGHT 193 lb 195 lb 195 lb 0.6 oz  HEIGHT 5\' 4"  5\' 3"  5\' 3"   BMI 33.13 kg/m2 34.55 kg/m2 34.56 kg/m2

## 2016-01-12 NOTE — Patient Instructions (Signed)
F/u as before, call if you need me sooner  You are treated for strep throat and thrush  Medication is sent in , take as directed  Work excuse for today  Return tomorrow

## 2016-01-12 NOTE — Assessment & Plan Note (Addendum)
1 week h/o sore throat, rapid strep is positive. Z pack prescribed and 1 day work excuse

## 2016-01-12 NOTE — Progress Notes (Signed)
   Anna Cantu     MRN: [id number]      DOB: [birth year]   HPI Ms. Fusco is here with a 1 week h/o sore throat and coated sore tongue. No know  Strep exposure, no fever ,did have  chills, sinus pressure or productive cough  ROS  Denies sinus pressure, nasal congestion, ear pain Denies chest congestion, productive cough or wheezing. Denies chest pains, palpitations and leg swelling Denies abdominal pain, nausea, vomiting,diarrhea or constipation.   Denies dysuria, frequency, hesitancy or incontinence.c/o white d/c no odoro or itch C/o chronic joint pain,  and limitation in mobility.   PE  BP 122/80   Pulse 78   Temp 99 F (37.2 C) (Oral)   Resp 16   Ht 5\' 4"  (1.626 m)   Wt 193 lb (87.5 kg)   LMP 07/01/2013   SpO2 100%   BMI 33.13 kg/m   Patient alert and oriented and in no cardiopulmonary distress.  HEENT: No facial asymmetry, EOMI,   oropharynx erythematous, no exudate and moist.  Neck supple no JVD, no mass.Mild right anterior cervical adenopathy. tM clear, no sinus tenderness. Thrush present on tongue  Chest: Clear to auscultation bilaterally.  CVS: S1, S2 no murmurs, no S3.Regular rate.  ABD: Soft non tender.   Ext: No edema  MS: Adequate ROM spine, shoulders, hips and knees.  Skin: Intact, no ulcerations or rash noted.  Psych: Good eye contact, normal affect. Memory intact not anxious or depressed appearing.  CNS: CN 2-12 intact, power,  normal throughout.no focal deficits noted.   Assessment & Plan  Streptococcal sore throat 1 week h/o sore throat, rapid strep is positive. Z pack prescribed and 1 day work excuse  Oral thrush Symptomatic thrush Dukes mouthwash and fluconazole x 2 tabs  Obesity (BMI 35.0-39.9 without comorbidity) Improved. Pt applauded on succesful weight loss through lifestyle change, and encouraged to continue same. Weight loss goal set for the next several months.

## 2016-01-12 NOTE — Assessment & Plan Note (Signed)
Symptomatic thrush Dukes mouthwash and fluconazole x 2 tabs

## 2016-01-18 ENCOUNTER — Telehealth: Payer: Self-pay | Admitting: Family Medicine

## 2016-01-18 ENCOUNTER — Other Ambulatory Visit: Payer: Self-pay

## 2016-01-18 MED ORDER — HYDROCODONE-ACETAMINOPHEN 7.5-325 MG PO TABS: ORAL_TABLET | ORAL | 0 refills | Status: DC

## 2016-01-18 NOTE — Telephone Encounter (Signed)
C/O stomach hurting loss of appetitie, finished the z-pak on Tuesday, she is asking if this could be a side affect of the medication, please advise?

## 2016-01-18 NOTE — Telephone Encounter (Signed)
Please advise 

## 2016-01-18 NOTE — Telephone Encounter (Signed)
Yes may  Be , but not mODT likely. Has h/o of gastritis and stomach cramps. I recom,mend she use oTC Zantac 150 mg  Twice daily for stomach burning, and ensure adequate fluid intake and slowly resume regular diet as feels able eg potato soup, toast, banana etc If needs med for nasuea pls send zofran 4 mg 3 times daily as needed #12 only

## 2016-01-19 NOTE — Telephone Encounter (Signed)
Called patient and left message for them to return call at the office   

## 2016-01-24 ENCOUNTER — Encounter: Payer: Self-pay | Admitting: Family Medicine

## 2016-01-24 ENCOUNTER — Ambulatory Visit (INDEPENDENT_AMBULATORY_CARE_PROVIDER_SITE_OTHER): Payer: Managed Care, Other (non HMO) | Admitting: Family Medicine

## 2016-01-24 VITALS — BP 118/80 | HR 65 | Resp 16 | Ht 64.0 in | Wt 195.4 lb

## 2016-01-24 DIAGNOSIS — Z23 Encounter for immunization: Secondary | ICD-10-CM | POA: Diagnosis not present

## 2016-01-24 DIAGNOSIS — J029 Acute pharyngitis, unspecified: Secondary | ICD-10-CM | POA: Diagnosis not present

## 2016-01-24 DIAGNOSIS — E669 Obesity, unspecified: Secondary | ICD-10-CM | POA: Diagnosis not present

## 2016-01-24 DIAGNOSIS — M25562 Pain in left knee: Secondary | ICD-10-CM

## 2016-01-24 DIAGNOSIS — M5410 Radiculopathy, site unspecified: Secondary | ICD-10-CM

## 2016-01-24 DIAGNOSIS — M25561 Pain in right knee: Secondary | ICD-10-CM

## 2016-01-24 DIAGNOSIS — Z1159 Encounter for screening for other viral diseases: Secondary | ICD-10-CM

## 2016-01-24 LAB — POCT RAPID STREP A (OFFICE): RAPID STREP A SCREEN: NEGATIVE

## 2016-01-24 MED ORDER — HYDROCODONE-ACETAMINOPHEN 7.5-325 MG PO TABS: ORAL_TABLET | ORAL | 0 refills | Status: DC

## 2016-01-24 MED ORDER — GABAPENTIN 100 MG PO CAPS
100.0000 mg | ORAL_CAPSULE | Freq: Two times a day (BID) | ORAL | 3 refills | Status: DC
Start: 2016-01-24 — End: 2016-07-11

## 2016-01-24 NOTE — Patient Instructions (Addendum)
F/u in January, call if you need me before  Reduced dose of hydrocodone to  One table once daily starting today  Increase gabapentin to one twice daily  Exercise and weight loss will help mostly to save the knees!  Labs today ,  Flu vaccine today  Please work on good  health habits so that your health will improve. 1. Commitment to daily physical activity for 30 to 60  minutes, if you are able to do this.  2. Commitment to wise food choices. Aim for half of your  food intake to be vegetable and fruit, one quarter starchy foods, and one quarter protein. Try to eat on a regular schedule  3 meals per day, snacking between meals should be limited to vegetables or fruits or small portions of nuts. 64 ounces of water per day is generally recommended, unless you have specific health conditions, like heart failure or kidney failure where you will need to limit fluid intake.  3. Commitment to sufficient and a  good quality of physical and mental rest daily, generally between 6 to 8 hours per day.  WITH PERSISTANCE AND PERSEVERANCE, THE IMPOSSIBLE , BECOMES THE NORM!  Thank you  for choosing [facility]. We consider it a privelige to serve you.  Delivering excellent health care in a caring and  compassionate way is our goal.  Partnering with you,  so that together we can achieve this goal is our strategy.

## 2016-01-24 NOTE — Telephone Encounter (Signed)
Noted that patient is scheduled for office visit on today

## 2016-01-25 LAB — LIPID PANEL
CHOL/HDL RATIO: 2 ratio (ref <=5.0)
Cholesterol: 174 mg/dL (ref 125–200)
HDL: 89 mg/dL (ref >=46)
LDL Cholesterol: 66 mg/dL (ref <130)
Triglycerides: 94 mg/dL (ref <150)
VLDL: 19 mg/dL (ref <30)

## 2016-01-25 LAB — HEPATITIS C ANTIBODY: HCV AB: NEGATIVE

## 2016-01-25 LAB — TSH: TSH: 0.56 m[IU]/L

## 2016-01-30 ENCOUNTER — Encounter: Payer: Self-pay | Admitting: Family Medicine

## 2016-01-30 NOTE — Assessment & Plan Note (Signed)
Mildly symptomatic, rapid test negative, salt water gargle, tylenol and voice rest recommended

## 2016-01-30 NOTE — Progress Notes (Signed)
   [name]     MRN: [id number]      DOB: 1965-06-28   HPI [name] is here for follow up and re-evaluation of chronic medical conditions, medication management and review of any available recent lab and radiology data.  Preventive health is updated, specifically  Cancer screening and Immunization.   Questions or concerns regarding consultations or procedures which the PT has had in the interim are  addressed. The PT denies any adverse reactions to current medications since the last visit.  ROS Denies recent fever or chills. Denies sinus pressure, nasal congestion, ear pain or sore throat. Denies chest congestion, productive cough or wheezing. Denies chest pains, palpitations and leg swelling Denies abdominal pain, nausea, vomiting,diarrhea or constipation.   Denies dysuria, frequency, hesitancy or incontinence. C/o  joint pain, swelling and limitation in mobility. Denies headaches, seizures, numbness, or tingling. Denies depression ,uncontrolled  anxiety or insomnia. Denies skin break down or rash.   PE  BP 118/80   Pulse 65   Resp 16   Ht 5\' 4"  (1.626 m)   Wt 195 lb 6.4 oz (88.6 kg)   LMP 07/01/2013   SpO2 100%   BMI 33.54 kg/m   Patient alert and oriented and in no cardiopulmonary distress.  HEENT: No facial asymmetry, EOMI,   oropharynx pink and moist.  Neck supple no JVD, no mass.  Chest: Clear to auscultation bilaterally.  CVS: S1, S2 no murmurs, no S3.Regular rate.  ABD: Soft non tender.   Ext: No edema  MS: Adequate ROM spine, shoulders, hips and reduced in knees.  Skin: Intact, no ulcerations or rash noted.  Psych: Good eye contact, normal affect. Memory intact not anxious or depressed appearing.  CNS: CN 2-12 intact, power,  normal throughout.no focal deficits noted.   Assessment & Plan  Sore throat Mildly symptomatic, rapid test negative, salt water gargle, tylenol and voice rest recommended  Arthralgia of both knees Weight  loss, quad strengthening, gabapentin and robaxin. voltaren gel topically  Back pain with left-sided radiculopathy Reduce hydrocodone to one at bedtime  Obesity (BMI 35.0-39.9 without comorbidity) Deteriorated. Patient re-educated about  the importance of commitment to a  minimum of 150 minutes of exercise per week.  The importance of healthy food choices with portion control discussed. Encouraged to start a food diary, count calories and to consider  joining a support group. Sample diet sheets offered. Goals set by the patient for the next several months.   Weight /BMI 01/24/2016 01/12/2016 10/17/2015  WEIGHT 195 lb 6.4 oz 193 lb 195 lb  HEIGHT 5\' 4"  5\' 4"  5\' 3"   BMI 33.54 kg/m2 33.13 kg/m2 34.55 kg/m2      Need for prophylactic vaccination and inoculation against influenza After obtaining informed consent, the vaccine is  administered by LPN.

## 2016-01-30 NOTE — Assessment & Plan Note (Signed)
After obtaining informed consent, the vaccine is  administered by LPN.  

## 2016-01-30 NOTE — Assessment & Plan Note (Signed)
Weight loss, quad strengthening, gabapentin and robaxin. voltaren gel topically

## 2016-01-30 NOTE — Assessment & Plan Note (Signed)
Reduce hydrocodone to one at bedtime

## 2016-01-30 NOTE — Assessment & Plan Note (Signed)
Deteriorated. Patient re-educated about  the importance of commitment to a  minimum of 150 minutes of exercise per week.  The importance of healthy food choices with portion control discussed. Encouraged to start a food diary, count calories and to consider  joining a support group. Sample diet sheets offered. Goals set by the patient for the next several months.   Weight /BMI 01/24/2016 01/12/2016 10/17/2015  WEIGHT 195 lb 6.4 oz 193 lb 195 lb  HEIGHT 5\' 4"  5\' 4"  5\' 3"   BMI 33.54 kg/m2 33.13 kg/m2 34.55 kg/m2

## 2016-02-09 ENCOUNTER — Other Ambulatory Visit: Payer: Self-pay

## 2016-02-09 MED ORDER — HYDROCODONE-ACETAMINOPHEN 7.5-325 MG PO TABS: ORAL_TABLET | ORAL | 0 refills | Status: DC

## 2016-02-15 ENCOUNTER — Other Ambulatory Visit: Payer: Self-pay | Admitting: Family Medicine

## 2016-02-15 ENCOUNTER — Telehealth: Payer: Self-pay

## 2016-02-15 MED ORDER — OLOPATADINE HCL 0.1 % OP SOLN: 1.0000 [drp] | Freq: Two times a day (BID) | OPHTHALMIC | 2 refills | Status: DC

## 2016-02-15 NOTE — Telephone Encounter (Signed)
Patient aware to check at pharmacy  

## 2016-02-15 NOTE — Telephone Encounter (Signed)
Script sent pls let her know (patanol)

## 2016-02-21 ENCOUNTER — Telehealth: Payer: Self-pay

## 2016-02-21 NOTE — Telephone Encounter (Signed)
Called and left message for patient to return call.  

## 2016-02-22 ENCOUNTER — Telehealth: Payer: Self-pay | Admitting: Family Medicine

## 2016-02-22 NOTE — Telephone Encounter (Signed)
Opened In Error

## 2016-02-26 ENCOUNTER — Encounter: Payer: Self-pay | Admitting: Family Medicine

## 2016-02-26 ENCOUNTER — Ambulatory Visit (INDEPENDENT_AMBULATORY_CARE_PROVIDER_SITE_OTHER): Payer: Managed Care, Other (non HMO) | Admitting: Family Medicine

## 2016-02-26 VITALS — BP 134/78 | HR 83 | Ht 64.0 in | Wt 197.8 lb

## 2016-02-26 DIAGNOSIS — M62838 Other muscle spasm: Secondary | ICD-10-CM | POA: Diagnosis not present

## 2016-02-26 DIAGNOSIS — M542 Cervicalgia: Secondary | ICD-10-CM | POA: Diagnosis not present

## 2016-02-26 MED ORDER — METHYLPREDNISOLONE ACETATE 80 MG/ML IJ SUSP
80.0000 mg | Freq: Once | INTRAMUSCULAR | Status: AC
  Administered 2016-02-26: 80 mg via INTRAMUSCULAR

## 2016-02-26 MED ORDER — DIAZEPAM 5 MG PO TABS: 5.0000 mg | ORAL_TABLET | Freq: Every evening | ORAL | 0 refills | Status: DC | PRN

## 2016-02-26 MED ORDER — KETOROLAC TROMETHAMINE 60 MG/2ML IM SOLN
60.0000 mg | Freq: Once | INTRAMUSCULAR | Status: AC
  Administered 2016-02-26: 60 mg via INTRAMUSCULAR

## 2016-02-26 NOTE — Progress Notes (Signed)
Chief Complaint  Patient presents with  . Pain    x7 days, pain mainly on R side from head to toe, increasing in back radiating to R foot, Left foot pain also, 10 on pain scale, pain meds/ muscle relaxer does not seem to alleviate  . Eye Problem    x7 days, R eye, pain, eye drops does not seem to alleviate   Patient is here for an acute visit. Her primary care doctor is usually [name]. She is here for 2 problems. One, she has pain and spasm on the right side of her neck. This is causing her to have headaches. Is causing her to have increased anxiety and tearfulness. She worries that something seriously wrong with her. She states she feels like she has pain that goes down her whole right side. No numbness or weakness. No stumbling or falls. No fall or injury. Long-standing neck pain and spasm. Long-standing degenerative disc disease of cervical spine. She has been trying her home therapy of Voltaren gel, methocarbamol at night, and gabapentin. These are not helping. Her second problem is redness and tearing in her right eye. This is been going on for greater than 2 weeks. She has been referred to an eye doctor. She has not yet made an appointment. She has no discomfort. She has no blurred vision. She is using Patanol drops. These are not helping. I referred her to go on to the eye doctor as instructed.  Patient Active Problem List   Diagnosis Date Noted  . Arthralgia of both knees 05/23/2015  . Allergic rhinitis 03/23/2015  . Sore throat 01/18/2015  . Left knee pain 10/11/2014  . Back pain with left-sided radiculopathy 10/11/2014  . Neck muscle spasm 10/11/2014  . Cervical high risk HPV (human papillomavirus) test positive 06/13/2014  . Need for prophylactic vaccination and inoculation against influenza 02/06/2014  . GERD (gastroesophageal reflux disease) 06/24/2013  . Fibroids 04/13/2013  . 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 Panic anxiety syndrome  12/15/2009  . Vitamin D deficiency 11/14/2009  . Obesity (BMI 35.0-39.9 without comorbidity) 08/11/2007    Outpatient Encounter Prescriptions as of 02/26/2016  Medication Sig  . Ascorbic Acid (VITAMIN C) 1000 MG tablet Take 1,000 mg by mouth daily.  . diclofenac sodium (VOLTAREN) 1 % GEL Apply 2 g topically 4 (four) times daily.  Marland Kitchen gabapentin (NEURONTIN) 100 MG capsule Take 1 capsule (100 mg total) by mouth 2 (two) times daily.  Marland Kitchen HYDROcodone-acetaminophen (NORCO) 7.5-325 MG tablet One tablet once daily for uncontrolled pain  . ibuprofen (ADVIL,MOTRIN) 800 MG tablet One tablet every day for arthritic pain  . methocarbamol (ROBAXIN) 750 MG tablet TAKE ONE TABLET BY MOUTH AT BEDTIME AS NEEDED FOR MUSCLE SPASMS  . Multiple Vitamin (MULITIVITAMIN WITH MINERALS) TABS Take 1 tablet by mouth daily.  Marland Kitchen olopatadine (PATANOL) 0.1 % ophthalmic solution Place 1 drop into both eyes 2 (two) times daily.  . Biotin 10 MG CAPS Take 1 capsule by mouth daily.   . diazepam (VALIUM) 5 MG tablet Take 1 tablet (5 mg total) by mouth at bedtime as needed for anxiety.  . furosemide (LASIX) 20 MG tablet TAKE ONE TABLET BY MOUTH ONCE DAILY AS NEEDED FOR  FLUID  RETENTION  /  SWELLING (Patient not taking: Reported on 02/26/2016)  . KLOR-CON M20 20 MEQ tablet TAKE ONE TABLET BY MOUTH TWICE DAILY -  USE  ON  DAYS  THAT  LASIX  IS  TAKEN  FOR  SWELLING (Patient not taking: Reported on 02/26/2016)  . montelukast (SINGULAIR) 10 MG tablet Take 1 tablet (10 mg total) by mouth at bedtime. (Patient not taking: Reported on 02/26/2016)  . [EXPIRED] ketorolac (TORADOL) injection 60 mg   . [EXPIRED] methylPREDNISolone acetate (DEPO-MEDROL) injection 80 mg    No facility-administered encounter medications on file as of 02/26/2016.     No Known Allergies  Review of Systems  Constitutional: Negative.  Negative for activity change and appetite change.  HENT: Negative for congestion, sinus pressure, sneezing and sore throat.   Eyes:  Positive for discharge and redness.       Clear tears and eye redness  Respiratory: Negative for cough and shortness of breath.   Cardiovascular: Negative for chest pain, palpitations and leg swelling.  Gastrointestinal: Negative for constipation and diarrhea.  Genitourinary: Negative for difficulty urinating and flank pain.  Musculoskeletal: Positive for arthralgias, back pain and neck pain.  Skin: Negative for rash.  Neurological: Positive for headaches. Negative for dizziness and numbness.  Psychiatric/Behavioral: Positive for dysphoric mood and sleep disturbance. The patient is nervous/anxious.     BP 134/78 (BP Location: Left Arm, Patient Position: Sitting, Cuff Size: Large)   Pulse 83   Ht 5\' 4"  (1.626 m)   Wt 197 lb 12.8 oz (89.7 kg)   LMP 07/01/2013   SpO2 99%   BMI 33.95 kg/m   Physical Exam  Constitutional: She is oriented to person, place, and time. She appears well-developed and well-nourished. She appears distressed.  Emotional distress, upset and tearful  HENT:  Head: Normocephalic and atraumatic.  Right Ear: External ear normal.  Left Ear: External ear normal.  Mouth/Throat: Oropharynx is clear and moist.  Eyes: EOM and lids are normal. Pupils are equal, round, and reactive to light. Lids are everted and swept, no foreign bodies found. Right eye exhibits no chemosis. No foreign body present in the right eye. Right conjunctiva is injected. Left conjunctiva is not injected.  Neck: No thyromegaly present.    Cardiovascular: Normal rate, regular rhythm and normal heart sounds.   Pulmonary/Chest: Breath sounds normal. She is in respiratory distress.  Musculoskeletal: Normal range of motion. She exhibits no edema.  Both knees with patellofemoral crepitus  Lymphadenopathy:    She has no cervical adenopathy.  Neurological: She is alert and oriented to person, place, and time. She has normal reflexes. No cranial nerve deficit. Coordination normal.  Strength sensation and  range of motion reflexes are normal in all 4 extremities  Skin: No rash noted.  Psychiatric: Her behavior is normal. Thought content normal.  Intelligent. Cooperative. Somewhat labile    ASSESSMENT/PLAN:  1. Acute neck pain - methylPREDNISolone acetate (DEPO-MEDROL) injection 80 mg; Inject 1 mL (80 mg total) into the muscle once. - ketorolac (TORADOL) injection 60 mg; Inject 2 mLs (60 mg total) into the muscle once. - Ambulatory referral to Physical Therapy  2. Neck muscle spasm    Patient Instructions  Home Rest Ice to painful neck muscle May take the diazepam ( valium) at night for 2 weeks to get over this episode After this go back to the methocarbamol (robaxin)  PT is recomended   Raylene Everts, MD

## 2016-02-26 NOTE — Patient Instructions (Signed)
Home Rest Ice to painful neck muscle May take the diazepam ( valium) at night for 2 weeks to get over this episode After this go back to the methocarbamol (robaxin)  PT is recomended

## 2016-03-04 ENCOUNTER — Ambulatory Visit (HOSPITAL_COMMUNITY): Payer: Managed Care, Other (non HMO) | Attending: Family Medicine | Admitting: Physical Therapy

## 2016-03-04 DIAGNOSIS — M5412 Radiculopathy, cervical region: Secondary | ICD-10-CM | POA: Diagnosis present

## 2016-03-04 DIAGNOSIS — M791 Myalgia, unspecified site: Secondary | ICD-10-CM

## 2016-03-04 NOTE — Therapy (Signed)
[place], [place], [address] Phone: [phone]   Fax:  [phone]  Physical Therapy Evaluation  Patient Details  Name: Anna Cantu MRN: [id number] Date of Birth: August 05, 1965 Referring Provider: Blanchie Serve  Encounter Date: 03/04/2016      PT End of Session - 03/04/16 1616    Visit Number 1   Number of Visits 12   Date for PT Re-Evaluation 04/03/16   Authorization Type AETNA   Authorization - Visit Number 1   Authorization - Number of Visits 10   PT Start Time [id number]   PT Stop Time 1610   PT Time Calculation (min) 45 min   Activity Tolerance Patient tolerated treatment well   Behavior During Therapy [facility] for tasks assessed/performed      Past Medical History:  Diagnosis Date  . Chronic back pain   . Gastritis   . Hemorrhoids   . Nicotine addiction   . Obesity   . Ovarian cyst     Past Surgical History:  Procedure Laterality Date  . btl and endometrial abaltion    . CHOLECYSTECTOMY  2007   Dr. Aviva Signs   . COLONOSCOPY   08/19/2008   Dr. Madolyn Frieze canal hemorrhoids.  Diminutive rectal polyp  status post cold biopsy and removal.  The remainder of rectal mucosa and colon was normal. Path: polypoid.   Marland Kitchen COLONOSCOPY WITH ESOPHAGOGASTRODUODENOSCOPY (EGD) N/A 07/08/2013   RMR: Rectal and colonic polyps-removed ast described above. colonic diverticulosis. Hemorroids-likely source of hematochezia.  Small hiatal hernia . Antral erosions;nonbleeding duodenal AVM; otherwise negative EGD-status post gastric biopsy.  . ESOPHAGOGASTRODUODENOSCOPY   08/19/2008   Dr. Rourk:Distal esophageal erosions, consistent with erosive reflux esophagitis/small HH/The remainder of her upper GI tract appeared normal  . left knee surgey- arthroscopy June 2011     Dr. Percell Miller   . right carpal tunnel release  1991  . Right Inguinal hernia repair  1982  . right knee surgery  1993  . TUBAL LIGATION      There were  no vitals filed for this visit.       Subjective Assessment - 03/04/16 1529    Subjective Pt has had neck pain for years but the pain increased significantly in the past three months.  Her pain radiates to her feet.   She is having difficulty sleeping due to a combination of both her back and her neck pain.     Pertinent History minimal bulging disc in lower back;  B arthroscopic surgery in knees, (pt gets gel injections approximately once a year.     How long can you sit comfortably? 15-20 minutes    How long can you stand comfortably? 15 minutes    How long can you walk comfortably? slow walking increases pain   Patient Stated Goals To be able to get some exercises to decrease her pain.     Currently in Pain? Yes   Pain Score 10-Worst pain ever   Pain Location Neck   Pain Orientation Right;Upper;Mid;Lower;Left   Pain Descriptors / Indicators Aching;Throbbing   Pain Type Chronic pain   Pain Onset More than a month ago   Pain Frequency Constant   Aggravating Factors  not sure    Pain Relieving Factors HMP, ice, ibuprofen, valium, mm relaxors.    Multiple Pain Sites Yes   Pain Location Back   Pain Orientation Lower   Pain Descriptors / Indicators Aching;Tender   Pain Type Chronic pain   Pain  Radiating Towards radiates to Rt foot    Pain Onset More than a month ago   Pain Frequency Constant  was intermittent    Aggravating Factors  sitting or standing too long    Pain Relieving Factors medication             [facility] PT Assessment - 03/04/16 0001      Assessment   Medical Diagnosis cervical pain    Referring Provider Blanchie Serve   Onset Date/Surgical Date 12/03/15   Next MD Visit not scheduled   Prior Therapy yes out patient for knee, back and neck      Precautions   Precautions None     Restrictions   Weight Bearing Restrictions No     Balance Screen   Has the patient fallen in the past 6 months No   Has the patient had a decrease in activity level because of a  fear of falling?  Yes   Is the patient reluctant to leave their home because of a fear of falling?  No     Home [employer] residence     Prior Function   Level of Independence Independent     Cognition   Overall Cognitive Status Within Functional Limits for tasks assessed     Observation/Other Assessments   Focus on Therapeutic Outcomes (FOTO)  41  carrying and moving objects      Posture/Postural Control   Posture/Postural Control No significant limitations     ROM / Strength   AROM / PROM / Strength AROM;Strength     AROM   AROM Assessment Site Cervical   Cervical Flexion 45  repetition decreases pain    Cervical Extension 60  reps decrease pain    Cervical - Right Side Bend 33  reps increase pain    Cervical - Left Side Bend 28  reps increase pain    Cervical - Right Rotation 58   Cervical - Left Rotation 50     Strength   Strength Assessment Site Cervical   Cervical Extension 3/5   Cervical - Right Side Bend 2+/5   Cervical - Left Side Bend 2+/5     Palpation   Palpation comment marked mm spasm Rt upper trap; moderate in Lt upper trap                    [facility] Adult PT Treatment/Exercise - 03/04/16 0001      Exercises   Exercises Neck     Neck Exercises: Seated   Neck Retraction 5 reps   Shoulder Shrugs 5 reps   Shoulder Shrugs Limitations up back and relax with concentration on the relax    Other Seated Exercise AROM x 5 each; scapular retraction x 5     Manual Therapy   Manual Therapy Soft tissue mobilization   Manual therapy comments done seperate from all othere aspects of treatment                PT Education - 03/04/16 1615    Education provided Yes   Education Details HEP   Person(s) Educated Patient   Methods Explanation;Handout;Verbal cues;Demonstration   Comprehension Verbalized understanding;Returned demonstration          PT Short Term Goals - 03/04/16 1624      PT SHORT TERM  GOAL #1   Title Pt cervical pain to be no greater than a 7/10 to allow pt to have better concentration at work  Time 3   Period Weeks   Status New     PT SHORT TERM GOAL #2   Title Pt cervical rotation to improve to 65 degrees bilaterally to allow pt to look in her blind spots while driving    Time 3   Period Weeks   Status New     PT SHORT TERM GOAL #3   Title Pt to be able to lower an object weighing less than three pounds down from the top closet shelf without difficulty.    Time 3   Period Weeks           PT Long Term Goals - 03/04/16 1627      PT LONG TERM GOAL #1   Title Pt cervical pain to be no greater than a 4/10 to allow pt to stop taking pain medication and muscle relaxors.    Time 6   Period Weeks   Status New     PT LONG TERM GOAL #2   Title Pt strength of cervical musculature to be increased one grade to allow pt to be headache free.    Time 6   Period Weeks   Status New     PT LONG TERM GOAL #3   Title Pt to be able to lower an object weight 3-5 pounds off the top shelf of the closet without difficulty.    Time 6   Period Weeks               Plan - 03/04/16 1617    Clinical Impression Statement Ms. Matousek is a 50 yo female that has had a recent exacerbation of her cervical pain.  She states that initially she could not even move her neck.  She went to the MD who prescribed medication as well as therapy.  She has been on the medication and states that  she is better now but still feels limited.  Examination demonstrates decreased cervical ROM, decreased cervical strength and moderate to severe mm spasms.   Ms. Rockoff will benefit from skilled physical therapy to address these issues and decrease her complaint of pain.     Rehab Potential Good   PT Frequency 2x / week   PT Duration 6 weeks   PT Treatment/Interventions ADLs/Self Care Home Management;Electrical Stimulation;Ultrasound;Moist Heat;Therapeutic activities;Therapeutic  exercise;Patient/family education;Manual techniques;Passive range of motion   PT Next Visit Plan begin cervical isometrics, neck stretch, and pectoral stretch; continue with manual may begin Ultrasound to spasm areas.    PT Home Exercise Plan cervical and scapular retraction, cervical ROM, shoulder shrugs    Consulted and Agree with Plan of Care Patient      Patient will benefit from skilled therapeutic intervention in order to improve the following deficits and impairments:  Decreased range of motion, Decreased strength, Impaired tone, Pain, Increased muscle spasms  Visit Diagnosis: Myalgia - Plan: PT plan of care cert/re-cert  Radiculopathy, cervical region - Plan: PT plan of care cert/re-cert     Problem List Patient Active Problem List   Diagnosis Date Noted  . Arthralgia of both knees 05/23/2015  . Allergic rhinitis 03/23/2015  . Sore throat 01/18/2015  . Left knee pain 10/11/2014  . Back pain with left-sided radiculopathy 10/11/2014  . Neck muscle spasm 10/11/2014  . Cervical high risk HPV (human papillomavirus) test positive 06/13/2014  . Need for prophylactic vaccination and inoculation against influenza 02/06/2014  . GERD (gastroesophageal reflux disease) 06/24/2013  . Fibroids 04/13/2013  . HSV-2 (herpes simplex virus 2) infection  11/05/2010  . Panic anxiety syndrome 12/15/2009  . Vitamin D deficiency 11/14/2009  . Obesity (BMI 35.0-39.9 without comorbidity) 08/11/2007   Rayetta Humphrey, PT CLT [phone] 03/04/2016, 4:33 PM  [place] [address] [place], [place], [address] Phone: [phone]   Fax:  [phone]  Name: Anna Cantu MRN: [id number] Date of Birth: 10-02-1965

## 2016-03-04 NOTE — Patient Instructions (Addendum)
Flexibility: Neck Retraction    Pull head straight back, keeping eyes and jaw level. Repeat __10__ times per set. Do _1___ sets per session. Do __2__ sessions per day.  http://orth.exer.us/344   Copyright  VHI. All rights reserved.  Scapular Retraction (Standing)    With arms at sides, pinch shoulder blades together and down Repeat _10___ times per set. Do __1__ sets per session. Do __2__ sessions per day.  http://orth.exer.us/944   Copyright  VHI. All rights reserved.  Strengthening: Shoulder Shrug (Phase 1)    Shrug shoulders up backward now relax Repeat __5__ times per set. Do __1__ sets per session. Do __2__ sessions per day.  http://orth.exer.us/336   Copyright  VHI. All rights reserved.  AROM: Lateral Neck Flexion    Slowly tilt head toward one shoulder, then the other. Hold each position __5__ seconds. Repeat _10___ times per set. Do __1__ sets per session. Do __2__ sessions per day.  http://orth.exer.us/296   Copyright  VHI. All rights reserved.  AROM: Neck Extension    Bend head backward. Hold __2__ seconds. Repeat _10___ times per set. Do __1__ sets per session. Do _2___ sessions per day.  http://orth.exer.us/300   Copyright  VHI. All rights reserved.

## 2016-03-08 ENCOUNTER — Ambulatory Visit (HOSPITAL_COMMUNITY): Payer: Managed Care, Other (non HMO) | Admitting: Physical Therapy

## 2016-03-12 ENCOUNTER — Other Ambulatory Visit: Payer: Self-pay | Admitting: Family Medicine

## 2016-03-12 ENCOUNTER — Ambulatory Visit (HOSPITAL_COMMUNITY): Payer: Managed Care, Other (non HMO) | Admitting: Physical Therapy

## 2016-03-12 DIAGNOSIS — Z1231 Encounter for screening mammogram for malignant neoplasm of breast: Secondary | ICD-10-CM

## 2016-03-12 DIAGNOSIS — M791 Myalgia, unspecified site: Secondary | ICD-10-CM

## 2016-03-12 DIAGNOSIS — M5412 Radiculopathy, cervical region: Secondary | ICD-10-CM

## 2016-03-12 NOTE — Therapy (Signed)
[place], [place], [address] Phone: [phone]   Fax:  [phone]  Physical Therapy Treatment  Patient Details  Name: Anna Cantu MRN: [id number] Date of Birth: 1966/04/02 Referring Provider: Blanchie Serve  Encounter Date: 03/12/2016      PT End of Session - 03/12/16 1431    Visit Number 2   Number of Visits 12   Date for PT Re-Evaluation 04/03/16   Authorization Type AETNA   Authorization - Visit Number 2   Authorization - Number of Visits 10   PT Start Time [id number]   PT Stop Time 1432   PT Time Calculation (min) 40 min   Activity Tolerance Patient tolerated treatment well   Behavior During Therapy [facility] for tasks assessed/performed      Past Medical History:  Diagnosis Date  . Chronic back pain   . Gastritis   . Hemorrhoids   . Nicotine addiction   . Obesity   . Ovarian cyst     Past Surgical History:  Procedure Laterality Date  . btl and endometrial abaltion    . CHOLECYSTECTOMY  2007   Dr. Aviva Signs   . COLONOSCOPY   08/19/2008   Dr. Madolyn Frieze canal hemorrhoids.  Diminutive rectal polyp  status post cold biopsy and removal.  The remainder of rectal mucosa and colon was normal. Path: polypoid.   Marland Kitchen COLONOSCOPY WITH ESOPHAGOGASTRODUODENOSCOPY (EGD) N/A 07/08/2013   RMR: Rectal and colonic polyps-removed ast described above. colonic diverticulosis. Hemorroids-likely source of hematochezia.  Small hiatal hernia . Antral erosions;nonbleeding duodenal AVM; otherwise negative EGD-status post gastric biopsy.  . ESOPHAGOGASTRODUODENOSCOPY   08/19/2008   Dr. Rourk:Distal esophageal erosions, consistent with erosive reflux esophagitis/small HH/The remainder of her upper GI tract appeared normal  . left knee surgey- arthroscopy June 2011     Dr. Percell Miller   . right carpal tunnel release  1991  . Right Inguinal hernia repair  1982  . right knee surgery  1993  . TUBAL LIGATION      There were  no vitals filed for this visit.      Subjective Assessment - 03/12/16 1425    Subjective Pt has been doing her exercises.  She has had a difficult morning    Pertinent History minimal bulging disc in lower back;  B arthroscopic surgery in knees, (pt gets gel injections approximately once a year.     How long can you sit comfortably? 15-20 minutes    How long can you stand comfortably? 15 minutes    How long can you walk comfortably? slow walking increases pain   Patient Stated Goals To be able to get some exercises to decrease her pain.     Pain Score 5    Pain Location Neck   Pain Orientation Right;Left;Upper   Pain Descriptors / Indicators Aching;Tender   Pain Onset More than a month ago   Pain Onset More than a month ago                         [facility] Adult PT Treatment/Exercise - 03/12/16 0001      Posture/Postural Control   Posture/Postural Control No significant limitations     Exercises   Exercises Neck     Neck Exercises: Machines for Strengthening   UBE (Upper Arm Bike) 4' backward for postural mm     Neck Exercises: Seated   Cervical Isometrics Extension;Right lateral flexion;Left lateral flexion;5 reps  Neck Retraction 5 reps   Shoulder Shrugs 5 reps   Shoulder Shrugs Limitations up back and relax with concentration on the relax    Other Seated Exercise --     Modalities   Modalities Ultrasound     Ultrasound   Ultrasound Location B upper trapezius musculature    Ultrasound Parameters 1.5 w/cm2 x 10:00   Ultrasound Goals Pain     Manual Therapy   Manual Therapy Soft tissue mobilization   Manual therapy comments done seperate from all othere aspects of treatment   Soft tissue mobilization marked mm spasm decreased to min to moderate with [place] and manual      Neck Exercises: Stretches   Neck Stretch 3 reps;30 seconds   Other Neck Stretches Pectorial stretch 3 way bilaterally                 PT Education - 03/12/16 1430     Education provided Yes   Education Details Pectorial stretches   Person(s) Educated Patient   Methods Explanation;Handout;Demonstration   Comprehension Verbalized understanding;Returned demonstration          PT Short Term Goals - 03/12/16 1434      PT SHORT TERM GOAL #1   Title Pt cervical pain to be no greater than a 7/10 to allow pt to have better concentration at work    Time 3   Period Weeks   Status On-going     PT SHORT TERM GOAL #2   Title Pt cervical rotation to improve to 65 degrees bilaterally to allow pt to look 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    Time 3   Period Weeks   Status On-going           PT Long Term Goals - 03/12/16 1435      PT LONG TERM GOAL #1   Title Pt cervical pain to be no greater than a 4/10 to allow pt to stop taking pain medication and muscle relaxors.    Time 6   Period Weeks   Status On-going     PT LONG TERM GOAL #2   Title Pt strength of cervical musculature to be increased one grade to allow pt to be headache free.    Time 6   Period Weeks   Status On-going     PT LONG TERM GOAL #3   Title Pt to be able to lower an object weight 3-5 pounds off the top shelf of the closet without difficulty.    Time 6   Period Weeks   Status On-going               Plan - 03/12/16 1432    Clinical Impression Statement Added ultrasound to help decrease spasms.  Added pectoral stretch with good form to help to improve overall posture.  Pt continues to have significant spasms in bilateral trapezius musculature.    Rehab Potential Good   PT Frequency 2x / week   PT Duration 6 weeks   PT Treatment/Interventions ADLs/Self Care Home Management;Electrical Stimulation;Ultrasound;Moist Heat;Therapeutic activities;Therapeutic exercise;Patient/family  education;Manual techniques;Passive range of motion   PT Next Visit Plan Begin t-band postural training    PT Home Exercise Plan cervical and scapular retraction, cervical  ROM, shoulder shrugs    Consulted and Agree with Plan of Care Patient      Patient will benefit from skilled therapeutic intervention in order to improve the following deficits and impairments:  Decreased range of motion, Decreased strength, Impaired tone, Pain, Increased muscle spasms  Visit Diagnosis: Myalgia  Radiculopathy, cervical region     Problem List Patient Active Problem List   Diagnosis Date Noted  . Arthralgia of both knees 05/23/2015  . Allergic rhinitis 03/23/2015  . Sore throat 01/18/2015  . Left knee pain 10/11/2014  . Back pain with left-sided radiculopathy 10/11/2014  . Neck muscle spasm 10/11/2014  . Cervical high risk HPV (human papillomavirus) test positive 06/13/2014  . Need for prophylactic vaccination and inoculation against influenza 02/06/2014  . GERD (gastroesophageal reflux disease) 06/24/2013  . Fibroids 04/13/2013  . 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 Panic anxiety syndrome 12/15/2009  . Vitamin D deficiency 11/14/2009  . Obesity (BMI 35.0-39.9 without comorbidity) 08/11/2007    Rayetta Humphrey, PT CLT [phone] 03/12/2016, 2:36 PM  [place], [place], [address] Phone: [phone]   Fax:  [phone]  Name: Anna Cantu MRN: [id number] Date of Birth: [birth year]

## 2016-03-13 ENCOUNTER — Ambulatory Visit (HOSPITAL_COMMUNITY): Payer: Managed Care, Other (non HMO) | Admitting: Physical Therapy

## 2016-03-14 ENCOUNTER — Ambulatory Visit (HOSPITAL_COMMUNITY): Payer: Managed Care, Other (non HMO)

## 2016-03-14 DIAGNOSIS — M791 Myalgia, unspecified site: Secondary | ICD-10-CM

## 2016-03-14 DIAGNOSIS — M5412 Radiculopathy, cervical region: Secondary | ICD-10-CM

## 2016-03-14 NOTE — Therapy (Signed)
[place], [place], [address] Phone: [phone]   Fax:  [phone]  Physical Therapy Treatment  Patient Details  Name: Anna Cantu MRN: [id number] Date of Birth: 10/20/1965 Referring Provider: Blanchie Serve  Encounter Date: 03/14/2016      PT End of Session - 03/14/16 1519    Visit Number 3   Number of Visits 12   Date for PT Re-Evaluation 04/03/16   Authorization Type AETNA   Authorization - Visit Number 3   Authorization - Number of Visits 10   PT Start Time [id number]   PT Stop Time 1602   PT Time Calculation (min) 48 min   Activity Tolerance Patient tolerated treatment well;Patient limited by pain  Initial pain 9/10, 0/10 at EOS   Behavior During Therapy [facility] for tasks assessed/performed      Past Medical History:  Diagnosis Date  . Chronic back pain   . Gastritis   . Hemorrhoids   . Nicotine addiction   . Obesity   . Ovarian cyst     Past Surgical History:  Procedure Laterality Date  . btl and endometrial abaltion    . CHOLECYSTECTOMY  2007   Dr. Aviva Signs   . COLONOSCOPY   08/19/2008   Dr. Madolyn Frieze canal hemorrhoids.  Diminutive rectal polyp  status post cold biopsy and removal.  The remainder of rectal mucosa and colon was normal. Path: polypoid.   Marland Kitchen COLONOSCOPY WITH ESOPHAGOGASTRODUODENOSCOPY (EGD) N/A 07/08/2013   RMR: Rectal and colonic polyps-removed ast described above. colonic diverticulosis. Hemorroids-likely source of hematochezia.  Small hiatal hernia . Antral erosions;nonbleeding duodenal AVM; otherwise negative EGD-status post gastric biopsy.  . ESOPHAGOGASTRODUODENOSCOPY   08/19/2008   Dr. Rourk:Distal esophageal erosions, consistent with erosive reflux esophagitis/small HH/The remainder of her upper GI tract appeared normal  . left knee surgey- arthroscopy June 2011     Dr. Percell Miller   . right carpal tunnel release  1991  . Right Inguinal hernia repair  1982  . right  knee surgery  1993  . TUBAL LIGATION      There were no vitals filed for this visit.      Subjective Assessment - 03/14/16 1515    Subjective Pt stated she difficulty sleeping and increased pain in the morning, interested in changing the pillow.  Feels posture plays a part in the pain.  Has been compliant wiht HEP and stretches daily.     Pertinent History minimal bulging disc in lower back;  B arthroscopic surgery in knees, (pt gets gel injections approximately once a year.     Patient Stated Goals To be able to get some exercises to decrease her pain.     Currently in Pain? Yes   Pain Score 9    Pain Location Neck   Pain Descriptors / Indicators Aching;Sore  Stiff, muscle spasms   Pain Type Chronic pain   Pain Onset More than a month ago   Pain Frequency Constant   Aggravating Factors  not sure   Pain Relieving Factors HMP, ice, ibuprofen, valium, mm relaxors            [facility] Adult PT Treatment/Exercise - 03/14/16 0001      Neck Exercises: Theraband   Scapula Retraction 5 reps;Red   Shoulder Extension 10 reps;Red   Rows 10 reps;Red     Neck Exercises: Seated   Neck Retraction 10 reps;5 secs   Shoulder Shrugs 10 reps   Shoulder Shrugs Limitations up back  and relax with concentration on the relax      Modalities   Modalities Ultrasound     Ultrasound   Ultrasound Location Bil Upper trazius musculature   Ultrasound Parameters 1.5 w/cm2x 10 1   Ultrasound Goals Pain     Manual Therapy   Manual Therapy Soft tissue mobilization   Manual therapy comments done seperate from all othere aspects of treatment   Soft tissue mobilization Bil Upper trap, levator scapula and posterior cervical mm     Neck Exercises: Stretches   Neck Stretch 3 reps;30 seconds   Other Neck Stretches Pectorial stretch 3 way bilaterally                   PT Short Term Goals - 03/12/16 1434      PT SHORT TERM GOAL #1   Title Pt cervical pain to be no greater than a 7/10 to allow  pt to have better concentration at work    Time 3   Period Weeks   Status On-going     PT SHORT TERM GOAL #2   Title Pt cervical rotation to improve to 65 degrees bilaterally to allow pt to look 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    Time 3   Period Weeks   Status On-going           PT Long Term Goals - 03/12/16 1435      PT LONG TERM GOAL #1   Title Pt cervical pain to be no greater than a 4/10 to allow pt to stop taking pain medication and muscle relaxors.    Time 6   Period Weeks   Status On-going     PT LONG TERM GOAL #2   Title Pt strength of cervical musculature to be increased one grade to allow pt to be headache free.    Time 6   Period Weeks   Status On-going     PT LONG TERM GOAL #3   Title Pt to be able to lower an object weight 3-5 pounds off the top shelf of the closet without difficulty.    Time 6   Period Weeks   Status On-going               Plan - 03/14/16 1817    Clinical Impression Statement Pt educated on importance of proper posture for pain control, added theraband for postural strengthening with min cueing initially.  [place] f/b manual soft tissue mobilizatin technqiues complete to reduce significant spasms in Bil upper trap mm, able to reduce though unable to fully resolve.  EOS pt reports pain free with improved awareness of proper posture.     Rehab Potential Good   PT Frequency 2x / week   PT Duration 6 weeks   PT Treatment/Interventions ADLs/Self Care Home Management;Electrical Stimulation;Ultrasound;Moist Heat;Therapeutic activities;Therapeutic exercise;Patient/family education;Manual techniques;Passive range of motion   PT Next Visit Plan Continue postural strenghtening, modalities and manual to reduce BIl UT spasms for pain control.  Progress as  able.     PT Home Exercise Plan cervical and scapular retraction, cervical ROM, shoulder shrugs       Patient will benefit from skilled therapeutic intervention in order to improve the following deficits and  impairments:  Decreased range of motion, Decreased strength, Impaired tone, Pain, Increased muscle spasms  Visit Diagnosis: Myalgia  Radiculopathy, cervical region     Problem List Patient Active Problem List   Diagnosis Date Noted  . Arthralgia of both knees 05/23/2015  . Allergic rhinitis 03/23/2015  . Sore throat 01/18/2015  . Left knee pain 10/11/2014  . Back pain with left-sided radiculopathy 10/11/2014  . Neck muscle spasm 10/11/2014  . Cervical high risk HPV (human papillomavirus) test positive 06/13/2014  . Need for prophylactic vaccination and inoculation against influenza 02/06/2014  . GERD (gastroesophageal reflux disease) 06/24/2013  . Fibroids 04/13/2013  . 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 Panic anxiety syndrome 12/15/2009  . Vitamin D deficiency 11/14/2009  . Obesity (BMI 35.0-39.9 without comorbidity) 08/11/2007   Ihor Austin, [place]; [place]  Aldona Lento 03/14/2016, 6:20 PM  [place], [place], [address] Phone: [phone]   Fax:  [phone]  Name: MARGEAUX MASSIE MRN: [id number] Date of Birth: [birth year]

## 2016-03-18 ENCOUNTER — Ambulatory Visit (HOSPITAL_COMMUNITY): Payer: Managed Care, Other (non HMO) | Admitting: Physical Therapy

## 2016-03-18 ENCOUNTER — Ambulatory Visit (HOSPITAL_COMMUNITY)
Admission: RE | Admit: 2016-03-18 | Discharge: 2016-03-18 | Disposition: A | Discharge status: Home / Self Care | Payer: Managed Care, Other (non HMO) | Source: Ambulatory Visit | Attending: Family Medicine | Admitting: Family Medicine

## 2016-03-18 DIAGNOSIS — M5412 Radiculopathy, cervical region: Secondary | ICD-10-CM

## 2016-03-18 DIAGNOSIS — M791 Myalgia, unspecified site: Secondary | ICD-10-CM

## 2016-03-18 DIAGNOSIS — Z1231 Encounter for screening mammogram for malignant neoplasm of breast: Secondary | ICD-10-CM | POA: Diagnosis present

## 2016-03-18 NOTE — Therapy (Signed)
[place], [place], [address] Phone: [phone]   Fax:  [phone]  Physical Therapy Treatment  Patient Details  Name: Anna Cantu MRN: [id number] Date of Birth: April 18, 1966 Referring Provider: Blanchie Serve  Encounter Date: 03/18/2016      PT End of Session - 03/18/16 1129    Visit Number 4   Number of Visits 12   Date for PT Re-Evaluation 04/03/16   Authorization Type AETNA   Authorization - Visit Number 4   Authorization - Number of Visits 10   PT Start Time 1046   PT Stop Time 1115   PT Time Calculation (min) 29 min   Activity Tolerance Patient tolerated treatment well;Patient limited by pain  Initial pain 9/10, 0/10 at EOS   Behavior During Therapy [facility] for tasks assessed/performed      Past Medical History:  Diagnosis Date  . Chronic back pain   . Gastritis   . Hemorrhoids   . Nicotine addiction   . Obesity   . Ovarian cyst     Past Surgical History:  Procedure Laterality Date  . btl and endometrial abaltion    . CHOLECYSTECTOMY  2007   Dr. Aviva Signs   . COLONOSCOPY   08/19/2008   Dr. Madolyn Frieze canal hemorrhoids.  Diminutive rectal polyp  status post cold biopsy and removal.  The remainder of rectal mucosa and colon was normal. Path: polypoid.   Marland Kitchen COLONOSCOPY WITH ESOPHAGOGASTRODUODENOSCOPY (EGD) N/A 07/08/2013   RMR: Rectal and colonic polyps-removed ast described above. colonic diverticulosis. Hemorroids-likely source of hematochezia.  Small hiatal hernia . Antral erosions;nonbleeding duodenal AVM; otherwise negative EGD-status post gastric biopsy.  . ESOPHAGOGASTRODUODENOSCOPY   08/19/2008   Dr. Rourk:Distal esophageal erosions, consistent with erosive reflux esophagitis/small HH/The remainder of her upper GI tract appeared normal  . left knee surgey- arthroscopy June 2011     Dr. Percell Miller   . right carpal tunnel release  1991  . Right Inguinal hernia repair  1982  . right  knee surgery  1993  . TUBAL LIGATION      There were no vitals filed for this visit.      Subjective Assessment - 03/18/16 1051    Subjective Pt was 15 minutes late for appt.  STates she walked all over [employer] and was unable to find one of those pillows.  States she [facility].  Currently 10/10 pain.  Explained the painscale twice to patient and still said she had 10/10 pain.  Pt denies need to go to ED.   Currently in Pain? Yes   Pain Score 10-Worst pain ever   Pain Location Neck   Pain Orientation Right;Left   Pain Descriptors / Indicators Aching   Pain Radiating Towards Towards head, giving her a headache.   Pain Frequency Constant                         [facility] Adult PT Treatment/Exercise - 03/18/16 0001      Neck Exercises: Theraband   Scapula Retraction 10 reps;Red   Shoulder Extension 10 reps;Red   Rows 10 reps;Red     Neck Exercises: Seated   Neck Retraction 10 reps;5 secs   Shoulder Shrugs 10 reps   Shoulder Shrugs Limitations up back and relax with concentration on the relax      Manual Therapy   Manual Therapy Soft tissue mobilization   Manual therapy comments done seperate from  all othere aspects of treatment   Soft tissue mobilization Bil Upper trap, levator scapula and posterior cervical mm     Neck Exercises: Stretches   Upper Trapezius Stretch 2 reps;20 seconds   Neck Stretch 2 reps;20 seconds   Corner Stretch 3 reps;20 seconds                PT Education - 03/18/16 1133    Education provided Yes   Education Details Discussed seeing a massage therapist vs PT after goals met here, strength and pain improved.     Person(s) Educated Patient   Methods Explanation   Comprehension Verbalized understanding          PT Short Term Goals - 03/12/16 1434      PT SHORT TERM GOAL #1   Title Pt cervical pain to be no greater than a 7/10 to allow pt to have better concentration at work    Time 3   Period Weeks    Status On-going     PT SHORT TERM GOAL #2   Title Pt cervical rotation to improve to 65 degrees bilaterally to allow pt to look 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    Time 3   Period Weeks   Status On-going           PT Long Term Goals - 03/12/16 1435      PT LONG TERM GOAL #1   Title Pt cervical pain to be no greater than a 4/10 to allow pt to stop taking pain medication and muscle relaxors.    Time 6   Period Weeks   Status On-going     PT LONG TERM GOAL #2   Title Pt strength of cervical musculature to be increased one grade to allow pt to be headache free.    Time 6   Period Weeks   Status On-going     PT LONG TERM GOAL #3   Title Pt to be able to lower an object weight 3-5 pounds off the top shelf of the closet without difficulty.    Time 6   Period Weeks   Status On-going               Plan - 03/18/16 1129    Clinical Impression Statement Continued with focus on improving postural and cervical strength while decreasing pain.  Pt able to recall all theraband exercises and complete indepently.  cues to reduce forward head posturing with therex.  Added corner stretch for tight chest muscles and Upper trap stretch seated.  Unable to complete US today due to time, but able to complete manual to bilateral upper trap muscles.  Large knots with Rt being larger.  Able to resolve 100% wtih reported pain reduction to 3/10 (was 10/10).  Noted improvement in cervical ROM.   Rehab Potential Good   PT Frequency 2x / week   PT Duration 6 weeks   PT Treatment/Interventions ADLs/Self Care Home Management;Electrical Stimulation;Ultrasound;Moist Heat;Therapeutic activities;Therapeutic exercise;Patient/family education;Manual techniques;Passive range of motion   PT Next Visit Plan Continue  postural strenghtening, modalities and manual to reduce BIl UT spasms for pain control.  Progress as able.     PT Home Exercise Plan cervical and scapular retraction, cervical  ROM, shoulder shrugs       Patient will benefit from skilled therapeutic intervention in order to improve the following deficits and impairments:  Decreased range of motion, Decreased strength, Impaired tone, Pain, Increased muscle spasms  Visit Diagnosis: Myalgia  Radiculopathy, cervical region     Problem List Patient Active Problem List   Diagnosis Date Noted  . Arthralgia of both knees 05/23/2015  . Allergic rhinitis 03/23/2015  . Sore throat 01/18/2015  . Left knee pain 10/11/2014  . Back pain with left-sided radiculopathy 10/11/2014  . Neck muscle spasm 10/11/2014  . Cervical high risk HPV (human papillomavirus) test positive 06/13/2014  . Need for prophylactic vaccination and inoculation against influenza 02/06/2014  . GERD (gastroesophageal reflux disease) 06/24/2013  . Fibroids 04/13/2013  . 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 Panic anxiety syndrome 12/15/2009  . Vitamin D deficiency 11/14/2009  . Obesity (BMI 35.0-39.9 without comorbidity) 08/11/2007    Teena Irani, PTA/CLT [phone]  03/18/2016, 11:34 AM  [place], [place], [address] Phone: [phone]   Fax:  [phone]  Name: Anna Cantu MRN: [id number] Date of Birth: May 16, 1966

## 2016-03-21 ENCOUNTER — Ambulatory Visit (HOSPITAL_COMMUNITY): Payer: Managed Care, Other (non HMO) | Admitting: Physical Therapy

## 2016-03-21 DIAGNOSIS — M5412 Radiculopathy, cervical region: Secondary | ICD-10-CM

## 2016-03-21 DIAGNOSIS — M791 Myalgia, unspecified site: Secondary | ICD-10-CM

## 2016-03-21 NOTE — Therapy (Signed)
Anna Cantu, [place], [address] Phone: [phone]   Fax:  [phone]  Physical Therapy Treatment  Patient Details  Name: Anna Cantu MRN: [id number] Date of Birth: 1966-02-20 Referring Provider: Blanchie Serve  Encounter Date: 03/21/2016      PT End of Session - 03/21/16 1247    Visit Number 5   Number of Visits 12   Date for PT Re-Evaluation 04/03/16   Authorization Type AETNA   Authorization - Visit Number 5   Authorization - Number of Visits 10   PT Start Time 1130   PT Stop Time 1200   PT Time Calculation (min) 30 min   Activity Tolerance Patient tolerated treatment well;Patient limited by pain  Initial pain 9/10, 0/10 at EOS   Behavior During Therapy [facility] for tasks assessed/performed      Past Medical History:  Diagnosis Date  . Chronic back pain   . Gastritis   . Hemorrhoids   . Nicotine addiction   . Obesity   . Ovarian cyst     Past Surgical History:  Procedure Laterality Date  . btl and endometrial abaltion    . CHOLECYSTECTOMY  2007   Dr. Aviva Signs   . COLONOSCOPY   08/19/2008   Dr. Madolyn Frieze canal hemorrhoids.  Diminutive rectal polyp  status post cold biopsy and removal.  The remainder of rectal mucosa and colon was normal. Path: polypoid.   Marland Kitchen COLONOSCOPY WITH ESOPHAGOGASTRODUODENOSCOPY (EGD) N/A 07/08/2013   RMR: Rectal and colonic polyps-removed ast described above. colonic diverticulosis. Hemorroids-likely source of hematochezia.  Small hiatal hernia . Antral erosions;nonbleeding duodenal AVM; otherwise negative EGD-status post gastric biopsy.  . ESOPHAGOGASTRODUODENOSCOPY   08/19/2008   Dr. Rourk:Distal esophageal erosions, consistent with erosive reflux esophagitis/small HH/The remainder of her upper GI tract appeared normal  . left knee surgey- arthroscopy June 2011     Dr. Percell Miller   . right carpal tunnel release  1991  . Right Inguinal hernia repair  1982  . right  knee surgery  1993  . TUBAL LIGATION      There were no vitals filed for this visit.      Subjective Assessment - 03/21/16 1138    Subjective Pt was 15 minutes late again.  Pt reports she still has 10/10 pain and has not been to get her a pillow yet.     Currently in Pain? Yes   Pain Score 10-Worst pain ever                         [facility] Adult PT Treatment/Exercise - 03/21/16 0001      Neck Exercises: Machines for Strengthening   UBE (Upper Arm Bike) 4' backward for postural mm     Neck Exercises: Theraband   Scapula Retraction Red;15 reps   Shoulder Extension Red;15 reps   Rows Red;15 reps     Neck Exercises: Seated   Shoulder Shrugs 10 reps   Shoulder Shrugs Limitations up back and relax with concentration on the relax      Modalities   Modalities --  Laser see below "other" manual, done with manual     Manual Therapy   Manual Therapy Soft tissue mobilization;Other (comment)   Manual therapy comments done seperate from all othere aspects of treatment   Soft tissue mobilization Bil Upper trap, levator scapula and posterior cervical mm   Other Manual Therapy laser:  chronic continuous to relax muscle.  1:24  each location at 60Joules.  4 areas Rt upper trap, 2 places Lt upper trap     Neck Exercises: Stretches   Upper Trapezius Stretch 2 reps;20 seconds   Neck Stretch 2 reps;20 seconds   Corner Stretch 3 reps;20 seconds                  PT Short Term Goals - 03/12/16 1434      PT SHORT TERM GOAL #1   Title Pt cervical pain to be no greater than a 7/10 to allow pt to have better concentration at work    Time 3   Period Weeks   Status On-going     PT SHORT TERM GOAL #2   Title Pt cervical rotation to improve to 65 degrees bilaterally to allow pt to look in her blind spots while driving    Time 3   Period Weeks   Status On-going     PT SHORT TERM GOAL #3   Title Pt to be able to lower an object weighing less than three pounds down  from the top closet shelf without difficulty.    Time 3   Period Weeks   Status On-going           PT Long Term Goals - 03/12/16 1435      PT LONG TERM GOAL #1   Title Pt cervical pain to be no greater than a 4/10 to allow pt to stop taking pain medication and muscle relaxors.    Time 6   Period Weeks   Status On-going     PT LONG TERM GOAL #2   Title Pt strength of cervical musculature to be increased one grade to allow pt to be headache free.    Time 6   Period Weeks   Status On-going     PT LONG TERM GOAL #3   Title Pt to be able to lower an object weight 3-5 pounds off the top shelf of the closet without difficulty.    Time 6   Period Weeks   Status On-going               Plan - 03/21/16 1247    Clinical Impression Statement Pt late for session and continued high pain at 10/10.  PT able to complete all therex without noted distress.  Spoke with PT and approved to try laser treatment along with manual to attempt to decrease tightness/spasm.  PT reported no significant change immediately following session.    Rehab Potential Good   PT Frequency 2x / week   PT Duration 6 weeks   PT Treatment/Interventions ADLs/Self Care Home Management;Electrical Stimulation;Ultrasound;Moist Heat;Therapeutic activities;Therapeutic exercise;Patient/family education;Manual techniques;Passive range of motion   PT Next Visit Plan Continue postural strenghtening, modalities and manual to reduce BIl UT spasms for pain control.  Progress as able.     PT Home Exercise Plan cervical and scapular retraction, cervical ROM, shoulder shrugs       Patient will benefit from skilled therapeutic intervention in order to improve the following deficits and impairments:  Decreased range of motion, Decreased strength, Impaired tone, Pain, Increased muscle spasms  Visit Diagnosis: Myalgia  Radiculopathy, cervical region     Problem List Patient Active Problem List   Diagnosis Date Noted  .  Arthralgia of both knees 05/23/2015  . Allergic rhinitis 03/23/2015  . Sore throat 01/18/2015  . Left knee pain 10/11/2014  . Back pain with left-sided radiculopathy 10/11/2014  . Neck muscle spasm 10/11/2014  .  Cervical high risk HPV (human papillomavirus) test positive 06/13/2014  . Need for prophylactic vaccination and inoculation against influenza 02/06/2014  . GERD (gastroesophageal reflux disease) 06/24/2013  . Fibroids 04/13/2013  . 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 Panic anxiety syndrome 12/15/2009  . Vitamin D deficiency 11/14/2009  . Obesity (BMI 35.0-39.9 without comorbidity) 08/11/2007    Teena Irani, PTA/CLT [phone]  03/21/2016, 12:59 PM  [place] [address] [place], [place], [address] Phone: [phone]   Fax:  [phone]  Name: MACIAH KOSTER MRN: [id number] Date of Birth: [birth year]

## 2016-03-25 ENCOUNTER — Ambulatory Visit (HOSPITAL_COMMUNITY): Payer: Managed Care, Other (non HMO) | Admitting: Physical Therapy

## 2016-03-25 DIAGNOSIS — M791 Myalgia, unspecified site: Secondary | ICD-10-CM

## 2016-03-25 DIAGNOSIS — M5412 Radiculopathy, cervical region: Secondary | ICD-10-CM

## 2016-03-25 NOTE — Therapy (Signed)
[place] [place], [place], [address] Phone: [phone]   Fax:  [phone]  Physical Therapy Treatment  Patient Details  Name: Anna Cantu MRN: [id number] Date of Birth: [birth year] Referring Provider: Blanchie Serve  Encounter Date: 03/25/2016      PT End of Session - 03/25/16 1513    Visit Number 6   Number of Visits 12   Date for PT Re-Evaluation 04/03/16   Authorization Type [employer]ET[employer]   Authorization - Visit Number 6   Authorization - Number of Visits 10   PT Start Time [id number]   PT Stop Time [id number]  patient arrived late/moist heat use    PT Time Calculation (min) 27 min   Activity Tolerance Patient tolerated treatment well;Patient limited by pain   Behavior During Therapy [facility] for tasks assessed/performed      Past Medical History:  Diagnosis Date  . Chronic back pain   . Gastritis   . Hemorrhoids   . Nicotine addiction   . Obesity   . Ovarian cyst     Past Surgical History:  Procedure Laterality Date  . btl and endometrial abaltion    . CHOLECYSTECTOMY  2007   Dr. Aviva Signs   . COLONOSCOPY   08/19/2008   Dr. Madolyn Frieze canal hemorrhoids.  Diminutive rectal polyp  status post cold biopsy and removal.  The remainder of rectal mucosa and colon was normal. Path: polypoid.   Marland Kitchen COLONOSCOPY WITH ESOPHAGOGASTRODUODENOSCOPY (EGD) N/A 07/08/2013   RMR: Rectal and colonic polyps-removed ast described above. colonic diverticulosis. Hemorroids-likely source of hematochezia.  Small hiatal hernia . Antral erosions;nonbleeding duodenal AVM; otherwise negative EGD-status post gastric biopsy.  . ESOPHAGOGASTRODUODENOSCOPY   08/19/2008   Dr. Rourk:Distal esophageal erosions, consistent with erosive reflux esophagitis/small HH/The remainder of her upper GI tract appeared normal  . left knee surgey- arthroscopy June 2011     Dr. Percell Miller   . right carpal tunnel release  1991  . Right Inguinal hernia repair  1982  .  right knee surgery  1993  . TUBAL LIGATION      There were no vitals filed for this visit.      Subjective Assessment - 03/25/16 1441    Subjective Patient arrives just a few minutes late today; she arrives requesting heat as this does help her pain along with massage. She did do her stretches this morning.    Pertinent History minimal bulging disc in lower back;  B arthroscopic surgery in knees, (pt gets gel injections approximately once a year.     Patient Stated Goals To be able to get some exercises to decrease her pain.     Currently in Pain? Yes   Pain Score 10-Worst pain ever  distress level DOES NOT match pain rating    Pain Location Neck   Pain Orientation Right;Left   Pain Descriptors / Indicators Aching;Throbbing   Pain Type Chronic pain   Pain Radiating Towards up towards head, giving her a headache    Pain Onset More than a month ago   Pain Frequency Constant   Aggravating Factors  housework    Pain Relieving Factors laying down, sleeping, heat, neck pillow    Effect of Pain on Daily Activities housework                          [facility] Adult PT Treatment/Exercise - 03/25/16 0001      Modalities   Modalities Moist Heat  Moist Heat Therapy   Number Minutes Moist Heat 8 Minutes   Moist Heat Location Cervical     Manual Therapy   Manual Therapy Soft tissue mobilization   Manual therapy comments done seperate from all othere aspects of treatment   Soft tissue mobilization Bil Upper trap, levator scapula and posterior cervical mm     Neck Exercises: Stretches   Other Neck Stretches pec stretch at door way 3x30 each side                 PT Education - 03/25/16 1513    Education provided Yes   Education Details importance of actual exercise instead of just massage to manage condtiion, dry needling    Person(s) Educated Patient   Methods Explanation   Comprehension Verbalized understanding          PT Short Term Goals - 03/12/16  1434      PT SHORT TERM GOAL #1   Title Pt cervical pain to be no greater than a 7/10 to allow pt to have better concentration at work    Time 3   Period Weeks   Status On-going     PT SHORT TERM GOAL #2   Title Pt cervical rotation to improve to 65 degrees bilaterally to allow pt to look 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    Time 3   Period Weeks   Status On-going           PT Long Term Goals - 03/12/16 1435      PT LONG TERM GOAL #1   Title Pt cervical pain to be no greater than a 4/10 to allow pt to stop taking pain medication and muscle relaxors.    Time 6   Period Weeks   Status On-going     PT LONG TERM GOAL #2   Title Pt strength of cervical musculature to be increased one grade to allow pt to be headache free.    Time 6   Period Weeks   Status On-going     PT LONG TERM GOAL #3   Title Pt to be able to lower an object weight 3-5 pounds off the top shelf of the closet without difficulty.    Time 6   Period Weeks   Status On-going               Plan - 03/25/16 1514    Clinical Impression Statement Patient arrives just a few minutes late today, she does state that she has 10/10 pain but patient presents very calm and tranquil, with distress levels not matching rated pain levels; calmly states "the hospital costs 2000 dollars" when DPT explained that 10/10 pain means a trip to the ER. Performed moist heat at beginning of session (not included in billing), then proceeded to massage and therapeutic exercise as tolerated with focus on pec stretches. Discussed possible dry needling with patient expressing interest.    Rehab Potential Good   PT Frequency 2x / week   PT Duration 6 weeks   PT Treatment/Interventions ADLs/Self Care Home Management;Electrical  Stimulation;Ultrasound;Moist Heat;Therapeutic activities;Therapeutic exercise;Patient/family education;Manual techniques;Passive range of motion   PT Next Visit Plan re-assess and send cert for possible dry needling  PT Home Exercise Plan cervical and scapular retraction, cervical ROM, shoulder shrugs , pec stretch    Consulted and Agree with Plan of Care Patient      Patient will benefit from skilled therapeutic intervention in order to improve the following deficits and impairments:  Decreased range of motion, Decreased strength, Impaired tone, Pain, Increased muscle spasms  Visit Diagnosis: Myalgia  Radiculopathy, cervical region     Problem List Patient Active Problem List   Diagnosis Date Noted  . Arthralgia of both knees 05/23/2015  . Allergic rhinitis 03/23/2015  . Sore throat 01/18/2015  . Left knee pain 10/11/2014  . Back pain with left-sided radiculopathy 10/11/2014  . Neck muscle spasm 10/11/2014  . Cervical high risk HPV (human papillomavirus) test positive 06/13/2014  . Need for prophylactic vaccination and inoculation against influenza 02/06/2014  . GERD (gastroesophageal reflux disease) 06/24/2013  . Fibroids 04/13/2013  . 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 Panic anxiety syndrome 12/15/2009  . Vitamin D deficiency 11/14/2009  . Obesity (BMI 35.0-39.9 without comorbidity) 08/11/2007    Deniece Ree PT, DPT [place] [address] [place], [place], [address] Phone: [phone]   Fax:  [phone]  Name: Anna Cantu MRN: [id number] Date of Birth: June 20, 1965

## 2016-03-25 NOTE — Patient Instructions (Signed)
    Doorway Pec Stretch   Find a doorway or corner of a wall. Slide your arm up to shoulder height with your arm bent to 90 deg. Lift in your chest and feel the front of your chest stretch, if you do not feel a stretch slowly turn your body away from the wall until you feel a stretch in your chest.   Hold 30 seconds and repeat 3 times each side, 2-3 times per day.

## 2016-03-28 ENCOUNTER — Ambulatory Visit (HOSPITAL_COMMUNITY): Payer: Managed Care, Other (non HMO) | Admitting: Physical Therapy

## 2016-03-28 ENCOUNTER — Telehealth (HOSPITAL_COMMUNITY): Payer: Self-pay | Admitting: Family Medicine

## 2016-03-28 NOTE — Telephone Encounter (Signed)
03/28/16 pt left a message that she had to cx because she had to work last

## 2016-04-02 ENCOUNTER — Ambulatory Visit (HOSPITAL_COMMUNITY): Payer: Managed Care, Other (non HMO) | Attending: Family Medicine | Admitting: Physical Therapy

## 2016-04-04 ENCOUNTER — Ambulatory Visit (HOSPITAL_COMMUNITY): Payer: Managed Care, Other (non HMO) | Admitting: Physical Therapy

## 2016-04-04 ENCOUNTER — Telehealth (HOSPITAL_COMMUNITY): Payer: Self-pay | Admitting: Physical Therapy

## 2016-04-04 NOTE — Telephone Encounter (Signed)
Pt is working late hours and wants to be put on hold,she will call back in 10 days to reschedule. NF

## 2016-04-07 ENCOUNTER — Other Ambulatory Visit: Payer: Self-pay | Admitting: Family Medicine

## 2016-04-08 ENCOUNTER — Ambulatory Visit (HOSPITAL_COMMUNITY)
Admission: RE | Admit: 2016-04-08 | Discharge: 2016-04-08 | Disposition: A | Discharge status: Home / Self Care | Payer: Managed Care, Other (non HMO) | Source: Ambulatory Visit | Attending: Family Medicine | Admitting: Family Medicine

## 2016-04-08 ENCOUNTER — Ambulatory Visit (INDEPENDENT_AMBULATORY_CARE_PROVIDER_SITE_OTHER): Payer: Managed Care, Other (non HMO) | Admitting: Family Medicine

## 2016-04-08 ENCOUNTER — Other Ambulatory Visit: Payer: Self-pay | Admitting: Family Medicine

## 2016-04-08 VITALS — BP 116/74 | HR 78 | Resp 18 | Ht 64.0 in | Wt 195.0 lb

## 2016-04-08 DIAGNOSIS — M1288 Other specific arthropathies, not elsewhere classified, other specified site: Secondary | ICD-10-CM | POA: Insufficient documentation

## 2016-04-08 DIAGNOSIS — N3000 Acute cystitis without hematuria: Secondary | ICD-10-CM

## 2016-04-08 DIAGNOSIS — M47812 Spondylosis without myelopathy or radiculopathy, cervical region: Secondary | ICD-10-CM | POA: Diagnosis not present

## 2016-04-08 DIAGNOSIS — M542 Cervicalgia: Secondary | ICD-10-CM | POA: Insufficient documentation

## 2016-04-08 LAB — POCT URINALYSIS DIPSTICK
Bilirubin, UA: NEGATIVE
Blood, UA: NEGATIVE
GLUCOSE UA: NEGATIVE
Ketones, UA: NEGATIVE
Leukocytes, UA: NEGATIVE
NITRITE UA: NEGATIVE
Protein, UA: NEGATIVE
Spec Grav, UA: >=1.03
UROBILINOGEN UA: 0.2
pH, UA: 6

## 2016-04-08 MED ORDER — KETOROLAC TROMETHAMINE 60 MG/2ML IM SOLN
60.0000 mg | Freq: Once | INTRAMUSCULAR | Status: AC
  Administered 2016-04-08: 60 mg via INTRAMUSCULAR

## 2016-04-08 MED ORDER — CYCLOBENZAPRINE HCL 10 MG PO TABS: 10.0000 mg | ORAL_TABLET | Freq: Every day | ORAL | 3 refills | Status: DC

## 2016-04-08 MED ORDER — IBUPROFEN 800 MG PO TABS: 800.0000 mg | ORAL_TABLET | Freq: Three times a day (TID) | ORAL | 1 refills | Status: DC

## 2016-04-08 NOTE — Assessment & Plan Note (Addendum)
Weak rigth grip and uncontrolled pain, despite physical therapy and oral pain medication , pt reports ongoing uncontrolled and disabling pain in [name].with h/o dropping obects in recent times due to weak grip. MRI c spine indicated, will update X ray of C spine Medication sent in including gabapentin

## 2016-04-08 NOTE — Patient Instructions (Addendum)
F/u in 3 month, call if you need me before  Please get X ray of neck today, toradol in office today, and ibuprofen x 5 days is sent in  You need MRI of neck and this will be arranged due to right hand weakness  Thank you  for choosing [facility]. We consider it a privelige to serve you.  Delivering excellent health care in a caring and  compassionate way is our goal.  Partnering with you,  so that together we can achieve this goal is our strategy.

## 2016-04-09 ENCOUNTER — Encounter (HOSPITAL_COMMUNITY): Payer: Managed Care, Other (non HMO) | Admitting: Physical Therapy

## 2016-04-11 ENCOUNTER — Encounter (HOSPITAL_COMMUNITY): Payer: Managed Care, Other (non HMO) | Admitting: Physical Therapy

## 2016-04-11 NOTE — Progress Notes (Signed)
   Anna Cantu     MRN: [id number]      DOB: [birth year]   HPI Anna Cantu is here for follow up and re-evaluation of chronic medical conditions, medication management and review of any available recent lab and radiology data.  Preventive health is updated, specifically  Cancer screening and Immunization.   C/o uncontrolled neck and RUE pain with right hand weakness despite physical therapy and  Anti inflammatory treatment Previous imaging of her neck demonstrated arthritis. She is requesting review of her current chronic pain management as she reports 10 plus neck pain and also reports right hand weakness, states she drops things. Pain disturbs her sleep. C/o urinary frequency with pressure and odor, concerned about a possible uTI, denies fever or flank pain   ROS Denies recent fever or chills. Denies sinus pressure, nasal congestion, ear pain or sore throat. Denies chest congestion, productive cough or wheezing. Denies chest pains, palpitations and leg swelling Denies abdominal pain, nausea, vomiting,diarrhea or constipation.     . Denies headaches, seizures,  Denies depression, anxiety or insomnia. Denies skin break down or rash.   PE  BP 116/74   Pulse 78   Resp 18   Ht 5\' 4"  (1.626 m)   Wt 195 lb (88.5 kg)   LMP 07/01/2013   SpO2 97%   BMI 33.47 kg/m   Patient alert and oriented and in no cardiopulmonary distress.Pt in pain  HEENT: No facial asymmetry, EOMI,   oropharynx pink and moist.  Neck decreased ROM no JVD, no mass.  Chest: Clear to auscultation bilaterally.  CVS: S1, S2 no murmurs, no S3.Regular rate.  ABD: Soft non tender. No renal angle tenderness  Ext: No edema  MS: Adequate though reduced  ROM spine, shoulders, hips and knees.  Skin: Intact, no ulcerations or rash noted.  Psych: Good eye contact, normal affect. Memory intact not anxious or depressed appearing.  CNS: CN 2-12 intact, grade 4 power in RUE , grade 4 power in right  hand, decreased sensation in RUE, normal tone  Assessment & Plan  Neck pain on right side Weak rigth grip and uncontrolled pain, despite physical therapy and oral pain medication , pt reports ongoing uncontrolled and disabling pain in [name].with h/o dropping obects in recent times due to weak grip. MRI c spine indicated, will update X ray of C spine Medication sent in including gabapentin  Acute cystitis without hematuria Mildly symptomatic, no recent fever or chills, normal exam, normal uA, pt reassured

## 2016-04-14 ENCOUNTER — Telehealth: Payer: Self-pay | Admitting: Family Medicine

## 2016-04-14 ENCOUNTER — Encounter: Payer: Self-pay | Admitting: Family Medicine

## 2016-04-14 NOTE — Telephone Encounter (Signed)
pls submit office note and ask pt's case for MRI c spine be reviewed, she has documented weakness and numbness on exam, has failed PT and oral medication for pain management and function, you may alos try to spk with someone when note is submitted, note was unavailable when first denied, I was unable to speak with then last week  I will re address on my return next week if still a problem, send me a tele message either way so I know , thanks

## 2016-04-14 NOTE — Assessment & Plan Note (Signed)
Mildly symptomatic, no recent fever or chills, normal exam, normal uA, pt reassured

## 2016-04-16 ENCOUNTER — Encounter (HOSPITAL_COMMUNITY): Payer: Managed Care, Other (non HMO) | Admitting: Physical Therapy

## 2016-04-17 NOTE — Telephone Encounter (Signed)
Clinicals and new referral has been sumitted to her insurance

## 2016-04-21 ENCOUNTER — Other Ambulatory Visit: Payer: Self-pay | Admitting: Family Medicine

## 2016-04-21 DIAGNOSIS — R221 Localized swelling, mass and lump, neck: Secondary | ICD-10-CM

## 2016-04-21 NOTE — Progress Notes (Signed)
Us neck

## 2016-05-01 ENCOUNTER — Telehealth: Payer: Self-pay | Admitting: Family Medicine

## 2016-05-01 NOTE — Telephone Encounter (Signed)
Her flexeril isn't working and needs something stronger

## 2016-05-02 ENCOUNTER — Other Ambulatory Visit: Payer: Self-pay | Admitting: Family Medicine

## 2016-05-02 ENCOUNTER — Other Ambulatory Visit: Payer: Self-pay

## 2016-05-02 MED ORDER — TIZANIDINE HCL 4 MG PO TABS: 4.0000 mg | ORAL_TABLET | Freq: Every day | ORAL | 0 refills | Status: DC

## 2016-05-02 NOTE — Telephone Encounter (Signed)
May try zannaflex I a have entered

## 2016-05-02 NOTE — Telephone Encounter (Signed)
Patient aware and medication sent to pharmacy  

## 2016-05-02 NOTE — Telephone Encounter (Signed)
Please advise 

## 2016-05-03 ENCOUNTER — Ambulatory Visit (HOSPITAL_COMMUNITY): Payer: Managed Care, Other (non HMO)

## 2016-05-06 ENCOUNTER — Ambulatory Visit (HOSPITAL_COMMUNITY)
Admission: RE | Admit: 2016-05-06 | Discharge: 2016-05-06 | Disposition: A | Discharge status: Home / Self Care | Payer: Managed Care, Other (non HMO) | Source: Ambulatory Visit | Attending: Family Medicine | Admitting: Family Medicine

## 2016-05-06 DIAGNOSIS — M1288 Other specific arthropathies, not elsewhere classified, other specified site: Secondary | ICD-10-CM | POA: Diagnosis not present

## 2016-05-06 DIAGNOSIS — M4802 Spinal stenosis, cervical region: Secondary | ICD-10-CM | POA: Diagnosis not present

## 2016-05-06 DIAGNOSIS — M4312 Spondylolisthesis, cervical region: Secondary | ICD-10-CM | POA: Insufficient documentation

## 2016-05-06 DIAGNOSIS — R221 Localized swelling, mass and lump, neck: Secondary | ICD-10-CM | POA: Diagnosis present

## 2016-05-06 DIAGNOSIS — M542 Cervicalgia: Secondary | ICD-10-CM

## 2016-05-07 ENCOUNTER — Other Ambulatory Visit: Payer: Self-pay

## 2016-05-07 MED ORDER — HYDROCODONE-ACETAMINOPHEN 7.5-325 MG PO TABS: ORAL_TABLET | ORAL | 0 refills | Status: DC

## 2016-05-16 ENCOUNTER — Telehealth: Payer: Self-pay | Admitting: Family Medicine

## 2016-05-16 ENCOUNTER — Other Ambulatory Visit: Payer: Self-pay | Admitting: Family Medicine

## 2016-05-16 NOTE — Telephone Encounter (Signed)
Anna Cantu is stating that the muscle relaxer that she is on has stopped working and she is stating that she cant sleep and she is in a lot of pain, please advise?

## 2016-05-17 NOTE — Telephone Encounter (Signed)
Please advise 

## 2016-05-17 NOTE — Telephone Encounter (Signed)
Urgent care visit advised

## 2016-05-21 ENCOUNTER — Other Ambulatory Visit: Payer: Self-pay | Admitting: Family Medicine

## 2016-05-21 MED ORDER — FLUCONAZOLE 150 MG PO TABS: 150.0000 mg | ORAL_TABLET | Freq: Once | ORAL | 0 refills | Status: AC

## 2016-05-21 NOTE — Telephone Encounter (Signed)
Patient is saying that current muscle relaxer is making her too groggy the next day.  Is asking if it can be changed to Valium in which she has had before and worked better.  Please advise.   Is also asking for Diflucan for yeast infection.

## 2016-05-21 NOTE — Telephone Encounter (Signed)
Combination of benzo, like valim and hydrocodone not recommended, inc risk of respiratorty depression, still on hydrocodone , will need to decide on one or the other , I recommend appt in mid to end Jan after she has time to seriously think about this. One fluconazole tab sent in to stated pharmacy

## 2016-05-23 ENCOUNTER — Other Ambulatory Visit: Payer: Self-pay | Admitting: Family Medicine

## 2016-05-23 NOTE — Telephone Encounter (Signed)
Valium contraindicated. Use half dose of flexeril and take earlier so less sleepy when she needs to be awake, will enter lower dose of flexeril pls send after you speak with her

## 2016-05-23 NOTE — Telephone Encounter (Signed)
Has zanafelx and  robaxin on her curremnt med list , so somewhat confusing

## 2016-05-23 NOTE — Telephone Encounter (Signed)
Patient scheduled for Jan appointment.  Would like to know if it makes a difference with Valium and Hydrocodone if she is only taking the Valium at night.  Please advise.   States that Tizanidine is not working for spasms.  Flexeril worked but made her sleepy the next day.

## 2016-05-28 NOTE — Telephone Encounter (Signed)
Stop all other muscle relaxers and only use the 5 mg flexeril an hour before bed.

## 2016-05-31 ENCOUNTER — Other Ambulatory Visit: Payer: Self-pay

## 2016-05-31 MED ORDER — HYDROCODONE-ACETAMINOPHEN 7.5-325 MG PO TABS: ORAL_TABLET | ORAL | 0 refills | Status: DC

## 2016-06-03 NOTE — Telephone Encounter (Signed)
Will address at upcoming ov

## 2016-06-04 ENCOUNTER — Ambulatory Visit (INDEPENDENT_AMBULATORY_CARE_PROVIDER_SITE_OTHER): Payer: 59 | Admitting: Family Medicine

## 2016-06-04 ENCOUNTER — Encounter: Payer: Self-pay | Admitting: Family Medicine

## 2016-06-04 VITALS — BP 112/70 | HR 73 | Resp 16 | Ht 64.0 in | Wt 198.0 lb

## 2016-06-04 DIAGNOSIS — M5410 Radiculopathy, site unspecified: Secondary | ICD-10-CM

## 2016-06-04 DIAGNOSIS — Z6833 Body mass index (BMI) 33.0-33.9, adult: Secondary | ICD-10-CM

## 2016-06-04 DIAGNOSIS — M62838 Other muscle spasm: Secondary | ICD-10-CM

## 2016-06-04 DIAGNOSIS — M542 Cervicalgia: Secondary | ICD-10-CM | POA: Diagnosis not present

## 2016-06-04 DIAGNOSIS — E6609 Other obesity due to excess calories: Secondary | ICD-10-CM

## 2016-06-04 MED ORDER — HYDROCODONE-ACETAMINOPHEN 7.5-325 MG PO TABS: ORAL_TABLET | ORAL | 0 refills | Status: DC

## 2016-06-04 MED ORDER — DIAZEPAM 5 MG PO TABS: ORAL_TABLET | ORAL | 2 refills | Status: DC

## 2016-06-04 NOTE — Patient Instructions (Signed)
F/u in 3 month, call if you need me before  Toradol and depomedrol in office today for neck and back pain  New pain contract as discussed  Three months of medications handed to you at visit today   Please work on good  health habits so that your health will improve. 1. Commitment to daily physical activity for 30 to 60  minutes, if you are able to do this.  2. Commitment to wise food choices. Aim for half of your  food intake to be vegetable and fruit, one quarter starchy foods, and one quarter protein. Try to eat on a regular schedule  3 meals per day, snacking between meals should be limited to vegetables or fruits or small portions of nuts. 64 ounces of water per day is generally recommended, unless you have specific health conditions, like heart failure or kidney failure where you will need to limit fluid intake.  3. Commitment to sufficient and a  good quality of physical and mental rest daily, generally between 6 to 8 hours per day.  WITH PERSISTANCE AND PERSEVERANCE, THE IMPOSSIBLE , BECOMES THE NORM!  Thank you  for choosing [facility]. We consider it a privelige to serve you.  Delivering excellent health care in a caring and  compassionate way is our goal.  Partnering with you,  so that together we can achieve this goal is our strategy.    HAPPY HAPPY BIRTHDAY!!!!

## 2016-06-05 DIAGNOSIS — M5410 Radiculopathy, site unspecified: Secondary | ICD-10-CM | POA: Diagnosis not present

## 2016-06-05 MED ORDER — KETOROLAC TROMETHAMINE 60 MG/2ML IM SOLN
60.0000 mg | Freq: Once | INTRAMUSCULAR | Status: AC
  Administered 2016-06-05: 60 mg via INTRAMUSCULAR

## 2016-06-05 MED ORDER — METHYLPREDNISOLONE ACETATE 80 MG/ML IJ SUSP
80.0000 mg | Freq: Once | INTRAMUSCULAR | Status: AC
  Administered 2016-06-05: 80 mg via INTRAMUSCULAR

## 2016-06-10 ENCOUNTER — Encounter: Payer: Self-pay | Admitting: Family Medicine

## 2016-06-10 NOTE — Assessment & Plan Note (Signed)
Uncontrolled.Toradol and depo medrol administered IM in the office , 

## 2016-06-10 NOTE — Assessment & Plan Note (Signed)
Deteriorated. Patient re-educated about  the importance of commitment to a  minimum of 150 minutes of exercise per week.  The importance of healthy food choices with portion control discussed. Encouraged to start a food diary, count calories and to consider  joining a support group. Sample diet sheets offered. Goals set by the patient for the next several months.   Weight /BMI 06/04/2016 04/08/2016 02/26/2016  WEIGHT 198 lb 195 lb 197 lb 12.8 oz  HEIGHT 5\' 4"  5\' 4"  5\' 4"   BMI 33.99 kg/m2 33.47 kg/m2 33.95 kg/m2

## 2016-06-10 NOTE — Assessment & Plan Note (Signed)
besrt response to valium pt to be maintained on bedtime valium

## 2016-06-10 NOTE — Assessment & Plan Note (Signed)
Uncontrolled.Toradol and depo medrol administered IM in the office ,chronic pain management as before

## 2016-06-10 NOTE — Progress Notes (Signed)
   Anna Cantu     MRN: [id number]      DOB: 1965/12/18   HPI Anna Cantu is here for follow up of chronic neck and back pain , and review opf updated pain contract and policy as  Far as prescribing practice is concerned, She c/o chronic ongoing neck, back and lower extremity pain , which she deems prevents her from ability to function without current management, which , unfortunately, is still often insufficient to control her fully. Reduction in medications at this time not a viable option, massages help but are not covered by her insurance C/o current symptom flare and is requesting injections at visit, no specific aggravating factor Also states that valium is the only muscle relaxant that actually helped her spasm and is requesting to stay on that medication ROS Denies recent fever or chills. Denies sinus pressure, nasal congestion, ear pain or sore throat. Denies chest congestion, productive cough or wheezing. Denies chest pains, palpitations and leg swelling Denies abdominal pain, nausea, vomiting,diarrhea or constipation.   Denies dysuria, frequency, hesitancy or incontinence. nsomnia. Denies skin break down or rash.   PE  BP 112/70   Pulse 73   Resp 16   Ht 5\' 4"  (1.626 m)   Wt 198 lb (89.8 kg)   LMP 07/01/2013   SpO2 97%   BMI 33.99 kg/m   Patient alert and oriented and in no cardiopulmonary distress.Pt in moderate pain  HEENT: No facial asymmetry, EOMI,   oropharynx pink and moist.  Neck decreased rOM with spasmJVD, no mass.  Chest: Clear to auscultation bilaterally.  CVS: S1, S2 no murmurs, no S3.Regular rate.  ABD: Soft non tender.   Ext: No edema  MS: Adequate though reduced ROM lumbar spinSkin: Intact, no ulcerations or rash noted.  Psych: Good eye contact, normal affect. Memory intact not anxious or depressed appearing.  CNS: CN 2-12 intact, power,  normal throughout.no focal deficits noted.   Assessment & Plan  Back pain with  left-sided radiculopathy Uncontrolled.Toradol and depo medrol administered IM in the office ,chronic pain management as before   Neck muscle spasm besrt response to valium pt to be maintained on bedtime valium  Neck pain on right side Uncontrolled.Toradol and depo medrol administered IM in the office , .   Obesity Deteriorated. Patient re-educated about  the importance of commitment to a  minimum of 150 minutes of exercise per week.  The importance of healthy food choices with portion control discussed. Encouraged to start a food diary, count calories and to consider  joining a support group. Sample diet sheets offered. Goals set by the patient for the next several months.   Weight /BMI 06/04/2016 04/08/2016 02/26/2016  WEIGHT 198 lb 195 lb 197 lb 12.8 oz  HEIGHT 5\' 4"  5\' 4"  5\' 4"   BMI 33.99 kg/m2 33.47 kg/m2 33.95 kg/m2

## 2016-06-24 ENCOUNTER — Telehealth: Payer: Self-pay

## 2016-06-24 NOTE — Telephone Encounter (Signed)
Offered patient appointment with Dr. Meda Coffee. She would like to continue current regimen for another day and will call back if she needs to be seen.

## 2016-07-09 ENCOUNTER — Ambulatory Visit: Payer: Managed Care, Other (non HMO) | Admitting: Family Medicine

## 2016-07-11 ENCOUNTER — Other Ambulatory Visit: Payer: Self-pay | Admitting: Family Medicine

## 2016-07-11 ENCOUNTER — Telehealth: Payer: Self-pay

## 2016-07-11 DIAGNOSIS — M5410 Radiculopathy, site unspecified: Secondary | ICD-10-CM

## 2016-07-11 MED ORDER — GABAPENTIN 100 MG PO CAPS: ORAL_CAPSULE | ORAL | 3 refills | Status: DC

## 2016-07-11 NOTE — Telephone Encounter (Signed)
Pt called asking if she can increase her gabapentin dose?

## 2016-07-11 NOTE — Telephone Encounter (Signed)
Yes may increase up to o 4 dai;ly, one twice daily and two at bedtime , I have printed script pls fax after you speak with her, thanks

## 2016-07-11 NOTE — Telephone Encounter (Signed)
Left message for pt to call office

## 2016-07-13 DIAGNOSIS — J019 Acute sinusitis, unspecified: Secondary | ICD-10-CM | POA: Diagnosis not present

## 2016-08-13 ENCOUNTER — Other Ambulatory Visit: Payer: Self-pay | Admitting: Family Medicine

## 2016-08-22 ENCOUNTER — Telehealth: Payer: Self-pay

## 2016-08-22 MED ORDER — FLUCONAZOLE 150 MG PO TABS: 150.0000 mg | ORAL_TABLET | Freq: Once | ORAL | 0 refills | Status: DC

## 2016-08-22 NOTE — Telephone Encounter (Signed)
Was given antibiotic by [facility] and requesting fluconazole for yeast. 1 tab sent per standing order

## 2016-08-27 ENCOUNTER — Ambulatory Visit: Payer: 59 | Admitting: Family Medicine

## 2016-09-12 ENCOUNTER — Telehealth: Payer: Self-pay

## 2016-09-12 NOTE — Telephone Encounter (Signed)
Patient c/o pain in her back legs and feet. Was wanting to collect hydrocodone. I again advised her that refills were only given during appointments. She stated that she didn't have insurance so that's why she never made a follow up appt. Stated she may have to go to the ER but would call next week and inquire about the cost of a self pay visit

## 2016-09-18 ENCOUNTER — Telehealth: Payer: Self-pay | Admitting: Family Medicine

## 2016-09-18 NOTE — Telephone Encounter (Signed)
Patient requesting vicodin and would like to know if she can get injections for pain (patient mentions that there is no appt required for this.  Please advise as first available appt is 5-23 with Dr. Moshe Cipro

## 2016-09-19 ENCOUNTER — Other Ambulatory Visit: Payer: Self-pay | Admitting: Family Medicine

## 2016-09-19 MED ORDER — IBUPROFEN 800 MG PO TABS: 800.0000 mg | ORAL_TABLET | Freq: Three times a day (TID) | ORAL | 0 refills | Status: DC | PRN

## 2016-09-19 MED ORDER — PREDNISONE 5 MG (21) PO TBPK: 5.0000 mg | ORAL_TABLET | ORAL | 0 refills | Status: DC

## 2016-09-19 NOTE — Telephone Encounter (Signed)
Pt states she is uninsured. Unable to come right now for OV to get hydrocodone. Requesting pain injections.

## 2016-09-19 NOTE — Telephone Encounter (Signed)
Her more affordable option is to fill the prednisone dose pack which I have senr in, I also sent in 1 week of ibuprofen, I see she just had some collected, she may hOLD on to the 1 wek and use as needed with ES tylenol

## 2016-09-20 NOTE — Telephone Encounter (Signed)
Patient aware.

## 2016-09-24 ENCOUNTER — Telehealth: Payer: Self-pay | Admitting: Family Medicine

## 2016-09-24 NOTE — Telephone Encounter (Signed)
Patient is requesting a steroid shot and a refill of pain medication.  cb#: [phone]

## 2016-09-25 NOTE — Telephone Encounter (Signed)
needs appt ,  Yes and payment arrangement per protocol for cash pay , which is not our responsibility to discuss, but front desk staff)

## 2016-09-25 NOTE — Telephone Encounter (Signed)
Per dr, she needs appt. Discuss payment options since she has no insurance. Ok to work in on a day where there are less than 10 pts on schedule

## 2016-09-25 NOTE — Telephone Encounter (Signed)
States she still wants an injection for pain and needs pain script. (I advised her this requires an appt at our last conversation)

## 2016-09-26 NOTE — Telephone Encounter (Signed)
Appointment scheduled.

## 2016-10-07 ENCOUNTER — Encounter: Payer: Self-pay | Admitting: Family Medicine

## 2016-10-07 ENCOUNTER — Ambulatory Visit (INDEPENDENT_AMBULATORY_CARE_PROVIDER_SITE_OTHER): Payer: Self-pay | Admitting: Family Medicine

## 2016-10-07 ENCOUNTER — Telehealth: Payer: Self-pay | Admitting: Family Medicine

## 2016-10-07 VITALS — BP 122/80 | HR 89 | Resp 16 | Ht 64.0 in | Wt 202.0 lb

## 2016-10-07 DIAGNOSIS — R52 Pain, unspecified: Secondary | ICD-10-CM

## 2016-10-07 DIAGNOSIS — M5431 Sciatica, right side: Secondary | ICD-10-CM

## 2016-10-07 MED ORDER — GABAPENTIN 100 MG PO CAPS: ORAL_CAPSULE | ORAL | 5 refills | Status: DC

## 2016-10-07 MED ORDER — METHYLPREDNISOLONE ACETATE 80 MG/ML IJ SUSP
120.0000 mg | Freq: Once | INTRAMUSCULAR | Status: AC
  Administered 2016-10-07: 120 mg via INTRAMUSCULAR

## 2016-10-07 MED ORDER — IBUPROFEN 800 MG PO TABS: 800.0000 mg | ORAL_TABLET | Freq: Three times a day (TID) | ORAL | 1 refills | Status: DC | PRN

## 2016-10-07 MED ORDER — RANITIDINE HCL 300 MG PO TABS: 300.0000 mg | ORAL_TABLET | Freq: Every day | ORAL | 0 refills | Status: DC

## 2016-10-07 MED ORDER — KETOROLAC TROMETHAMINE 60 MG/2ML IM SOLN
60.0000 mg | Freq: Once | INTRAMUSCULAR | Status: AC
  Administered 2016-10-07: 60 mg via INTRAMUSCULAR

## 2016-10-07 MED ORDER — HYDROCODONE-ACETAMINOPHEN 7.5-325 MG PO TABS: ORAL_TABLET | ORAL | 0 refills | Status: DC

## 2016-10-07 MED ORDER — PREDNISONE 10 MG (21) PO TBPK: ORAL_TABLET | ORAL | 0 refills | Status: DC

## 2016-10-07 NOTE — Progress Notes (Signed)
   Anna Cantu     MRN: [id number]      DOB: 1965-06-10   HPI Anna Cantu is here with a 1 month h/o low back pain radiating down right buttock to all 5 toes Anxious and worried, wonders if uncontrolled and worsening pain will prevent her from working Here for chronic pain management needs, states on pain medication she is able to function, but does note numbness and weakness in RLE, which is new, she denies incontinence of stool or urine  ROS Denies recent fever or chills. Denies sinus pressure, nasal congestion, ear pain or sore throat. Denies chest congestion, productive cough or wheezing. Denies chest pains, palpitations and leg swelling Denies abdominal pain, nausea, vomiting,diarrhea or constipation.   Denies dysuria, frequency, hesitancy or incontinence.  Denies skin break down or rash.   PE  BP 122/80   Pulse 89   Resp 16   Ht 5\' 4"  (1.626 m)   Wt 202 lb (91.6 kg)   LMP 07/01/2013   SpO2 98%   BMI 34.67 kg/m   Patient alert and oriented and in no cardiopulmonary distress.  HEENT: No facial asymmetry, EOMI,   oropharynx pink and moist.  Neck supple no JVD, no mass.  Chest: Clear to auscultation bilaterally.  CVS: S1, S2 no murmurs, no S3.Regular rate.  ABD: Soft non tender.   Ext: No edema  MS: Decreased  ROM lumbar  Spine, normal in shoulders, hips and knees.  Skin: Intact, no ulcerations or rash noted.  Psych: Good eye contact, normal affect. Memory intact  anxious tearful and mildly  depressed appearing.  CNS: CN 2-12 intact,  Grade 4 power and decreased sensation in RLE otherwise  normal.   Assessment & Plan  Back pain with right-sided sciatica Uncontrolled.Toradol and depo medrol administered IM in the office , to be followed by a short course of oral prednisone and NSAIDS. Gabapentin prescribed for chronic pain management  Encounter for pain management Uncontrolled pain off of chronic pain medication. 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 Her contract is reinstated. She has been unable to come in to the office as she is currently uninsured , but states she is non functional off of pain medication 12 week supply of pain medication to be  Provided, this following a call made after the visit, the initial script provided at visit stated 30 tablets to last 6 months, this will need to be reviewed

## 2016-10-07 NOTE — Telephone Encounter (Signed)
Patient calling re her Rx.  She is requesting Hydrocodone 3 MONTH  SUPPLY.  Please call [phone]

## 2016-10-07 NOTE — Patient Instructions (Addendum)
f/u in 6 month, call if you need me before  Injections in office for back pain and prescriptions as on med list  Thank you  for choosing [facility]. We consider it a privelige to serve you.  Delivering excellent health care in a caring and  compassionate way is our goal.  Partnering with you,  so that together we can achieve this goal is our strategy.

## 2016-10-07 NOTE — Assessment & Plan Note (Addendum)
Uncontrolled.Toradol and depo medrol administered IM in the office , to be followed by a short course of oral prednisone and NSAIDS. Gabapentin prescribed for chronic pain management

## 2016-10-09 ENCOUNTER — Other Ambulatory Visit: Payer: Self-pay

## 2016-10-09 MED ORDER — HYDROCODONE-ACETAMINOPHEN 7.5-325 MG PO TABS: ORAL_TABLET | ORAL | 0 refills | Status: DC

## 2016-10-09 NOTE — Telephone Encounter (Signed)
Message left as I attempted to speak with the patient. Review of her narcotic registry that since nov 2017 until March 2018 she was prescribed  30 hydrocodone 7.5 mg tablets each month by me for back pain, I will therefore need to have her return the script she was recently provided, and have her sign a contract for a 3 month supply of medication if she needs to stay on the same dose of medication

## 2016-10-09 NOTE — Telephone Encounter (Signed)
Patient aware.

## 2016-10-09 NOTE — Telephone Encounter (Signed)
Patient called and said she only got 30 to last 6 months and she had been taking one a day but she ran out and couldn't come in until she got money since she is uninsured. She states that she is in so much pain and she has to be able to get up and get a job to be able to get insurance back again

## 2016-10-09 NOTE — Telephone Encounter (Signed)
pls print  I will sign and reschedule appropriately she will need 2 months of scripts and a 12 week follow up, thanks, sorry about the confusion, please also let her know and the pharmacy also

## 2016-10-09 NOTE — Telephone Encounter (Signed)
She already signed a new Optician, dispensing in January. She filled the rx given yesterday. Want her to come in to collect the other 2? And her appt needs to be changed as well.

## 2016-10-13 ENCOUNTER — Encounter: Payer: Self-pay | Admitting: Family Medicine

## 2016-10-13 DIAGNOSIS — R52 Pain, unspecified: Secondary | ICD-10-CM | POA: Insufficient documentation

## 2016-10-13 NOTE — Assessment & Plan Note (Addendum)
Uncontrolled pain off of chronic pain medication. 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 Her contract is reinstated. She has been unable to come in to the office as she is currently uninsured , but states she is non functional off of pain medication 12 week supply of pain medication to be  Provided, this following a call made after the visit, the initial script provided at visit stated 30 tablets to last 6 months, this will need to be reviewed

## 2016-10-23 ENCOUNTER — Other Ambulatory Visit: Payer: Self-pay | Admitting: Family Medicine

## 2016-11-07 ENCOUNTER — Telehealth: Payer: Self-pay | Admitting: Family Medicine

## 2016-11-07 ENCOUNTER — Other Ambulatory Visit: Payer: Self-pay | Admitting: Family Medicine

## 2016-11-07 MED ORDER — DIAZEPAM 5 MG PO TABS: ORAL_TABLET | ORAL | 0 refills | Status: DC

## 2016-11-07 NOTE — Telephone Encounter (Signed)
Medication is neing sent to her pharmacy notified, [facility]

## 2016-11-07 NOTE — Progress Notes (Signed)
Diazepam  

## 2016-11-07 NOTE — Telephone Encounter (Signed)
Patient is requesting a refill for valium   Cb#: [phone]

## 2016-11-09 ENCOUNTER — Other Ambulatory Visit: Payer: Self-pay | Admitting: Family Medicine

## 2016-12-11 ENCOUNTER — Other Ambulatory Visit: Payer: Self-pay | Admitting: Family Medicine

## 2016-12-11 ENCOUNTER — Telehealth: Payer: Self-pay | Admitting: Family Medicine

## 2016-12-11 MED ORDER — DIAZEPAM 5 MG PO TABS: ORAL_TABLET | ORAL | 0 refills | Status: DC

## 2016-12-11 NOTE — Telephone Encounter (Signed)
New Message  Pt voiced needing refills of her medication diazetam 5mg .  Please f/u

## 2017-01-13 ENCOUNTER — Telehealth: Payer: Self-pay | Admitting: Family Medicine

## 2017-01-13 NOTE — Telephone Encounter (Signed)
Patient would like to know if she can have an injection since the first available appointment is 01-21-17.  She would also like to know if there is a topical that she can put on her feet & legs for arthritis pain.  She has tried bio-freeze and icy hot. Please advise

## 2017-01-13 NOTE — Telephone Encounter (Signed)
I do not recommend injections of steroid before 4 months, had last in May , so not before September is my best recommendation  No specific topical works better than the other as far as I am aware unfortunately , so no specific recommendation. please offer script for Apothecary compound, if she wishes I will prescribe and she can collect the script, thanks

## 2017-01-14 ENCOUNTER — Encounter: Payer: Self-pay | Admitting: Family Medicine

## 2017-01-14 ENCOUNTER — Ambulatory Visit (INDEPENDENT_AMBULATORY_CARE_PROVIDER_SITE_OTHER): Payer: Self-pay | Admitting: Family Medicine

## 2017-01-14 VITALS — BP 118/80 | HR 71 | Resp 16 | Ht 64.0 in | Wt 196.0 lb

## 2017-01-14 DIAGNOSIS — F410 Panic disorder [episodic paroxysmal anxiety] without agoraphobia: Secondary | ICD-10-CM

## 2017-01-14 DIAGNOSIS — R52 Pain, unspecified: Secondary | ICD-10-CM

## 2017-01-14 MED ORDER — HYDROCODONE-ACETAMINOPHEN 7.5-325 MG PO TABS: ORAL_TABLET | ORAL | 0 refills | Status: DC

## 2017-01-14 MED ORDER — DIAZEPAM 5 MG PO TABS: ORAL_TABLET | ORAL | 0 refills | Status: DC

## 2017-01-14 NOTE — Telephone Encounter (Signed)
She is going to run out of pain meds because she was given 12 weeks of meds but told to schedule a follow up in 6 months. There was a note in her chart about 30 pills lasting 6 months but this was not verified. Please advise

## 2017-01-14 NOTE — Assessment & Plan Note (Signed)
improved   Weight /BMI 01/14/2017 10/07/2016 06/04/2016  WEIGHT 196 lb 202 lb 198 lb  HEIGHT 5\' 4"  5\' 4"  5\' 4"   BMI 33.64 kg/m2 34.67 kg/m2 33.99 kg/m2

## 2017-01-14 NOTE — Telephone Encounter (Signed)
At 2pm today. Please call and let her know

## 2017-01-14 NOTE — Telephone Encounter (Signed)
Patient is coming in at 2:00 today. Patient is aware.

## 2017-01-14 NOTE — Patient Instructions (Signed)
F/u in 12 weeks, call if you need me sooner  No labs needed at this time  No change in medication  management  All the best with job hunt, let us know when you are successful! Work on stress and conflict as we discussed

## 2017-01-14 NOTE — Telephone Encounter (Signed)
Print out out please her narc histopry verify that she is on time and I will see her for pain management only, no shots today

## 2017-01-14 NOTE — Progress Notes (Signed)
   Anna Cantu     MRN: [id number]      DOB: [birth year]   HPI Ms. Lagman is here for follow up of chronic pain management Continues to experience chronic disabling back and knee pain, reports adequate control on current regime, however her  Ability  to function is limited by arthritic problems  C/o increased stress , anxiety because of poor relationship with her daughter in law but she is working on this  ROS Denies recent fever or chills. Denies sinus pressure, nasal congestion, ear pain or sore throat. Denies chest congestion, productive cough or wheezing. Denies chest pains, palpitations and leg swelling Denies abdominal pain, nausea, vomiting,diarrhea or constipation.   Denies dysuria, frequency, hesitancy or incontinence.  Denies skin break down or rash.   PE  BP 118/80   Pulse 71   Resp 16   Ht 5\' 4"  (1.626 m)   Wt 196 lb (88.9 kg)   LMP 07/01/2013   SpO2 98%   BMI 33.64 kg/m   Patient alert and oriented and in no cardiopulmonary distress.  HEENT: No facial asymmetry, EOMI,   oropharynx pink and moist.  Neck supple no JVD, no mass.  Chest: Clear to auscultation bilaterally.  CVS: S1, S2 no murmurs, no S3.Regular rate.  ABD: Soft non tender.   Ext: No edema  [id number]  ROM spine, shoulders, hips and knees.  Skin: Intact, no ulcerations or rash noted.  Psych: Good eye contact, tearful  affect. Memory intact  anxious , tearful and  depressed appearing.  CNS: CN 2-12 intact, power,  normal throughout.no focal deficits noted.   Assessment & Plan  Encounter for pain management The patient's Controlled Substance registry is reviewed and compliance confirmed. Adequacy of  Pain control and level of function is assessed. Medication dosing is adjusted as deemed appropriate. Twelve weeks of medication is prescribed , patient signs for the script and is provided with a follow up appointment between 11 to 12 weeks .   Panic anxiety  syndrome Increased anxiety due to poor relationship with her daughter in law, encouraged her to ventilate for approx 7 minutes and encouraged ways to deal wit stress and disagreement in a less destructive / unhealthy manner  Obesity improved   Weight /BMI 01/14/2017 10/07/2016 06/04/2016  WEIGHT 196 lb 202 lb 198 lb  HEIGHT 5\' 4"  5\' 4"  5\' 4"   BMI 33.64 kg/m2 34.67 kg/m2 33.99 kg/m2

## 2017-01-14 NOTE — Assessment & Plan Note (Signed)
Increased anxiety due to poor relationship with her daughter in law, encouraged her to ventilate for approx 7 minutes and encouraged ways to deal wit stress and disagreement in a less destructive / unhealthy manner

## 2017-01-14 NOTE — Assessment & Plan Note (Signed)
The patient's Controlled Substance registry is reviewed and compliance confirmed. Adequacy of  Pain control and level of function is assessed. Medication dosing is adjusted as deemed appropriate. Twelve weeks of medication is prescribed , patient signs for the script and is provided with a follow up appointment between 11 to 12 weeks .  

## 2017-01-16 ENCOUNTER — Encounter (HOSPITAL_COMMUNITY): Payer: Self-pay | Admitting: Physical Therapy

## 2017-01-16 NOTE — Therapy (Signed)
[facility] [place] [place], [place], [address] Phone: [phone]   Fax:  [phone]  Patient Details  Name: Anna Cantu MRN: [id number] Date of Birth: [birth year] Referring Provider:  No ref. provider found  Encounter Date: 01/16/2017   PHYSICAL THERAPY DISCHARGE SUMMARY  Visits from Start of Care: 6  Current functional level related to goals / functional outcomes: Patient has not returned to skilled PT services; discharge per policy.    Remaining deficits: Unable to assess    Education / Equipment: None  Plan: Patient agrees to discharge.  Patient goals were not met. Patient is being discharged due to not returning since the last visit.  ?????       Deniece Ree PT, DPT [place] [address] [place], [place], [address] Phone: [phone]   Fax:  [phone]

## 2017-01-21 ENCOUNTER — Ambulatory Visit: Payer: Self-pay | Admitting: Family Medicine

## 2017-01-23 ENCOUNTER — Telehealth: Payer: Self-pay | Admitting: Family Medicine

## 2017-01-23 NOTE — Telephone Encounter (Signed)
Patient left message on nurse line. She states that she went to get a massage, as she tries to do every month to help with tension and knots, and it hurt for her body to be touched. Wants to know if this is normal?  Callback [phone]

## 2017-01-24 NOTE — Telephone Encounter (Signed)
Patient informed of message below, verbalized understanding.  

## 2017-01-24 NOTE — Telephone Encounter (Signed)
With tension and anxiety and stress, every part of body may hurt

## 2017-02-13 ENCOUNTER — Telehealth: Payer: Self-pay | Admitting: Family Medicine

## 2017-02-13 NOTE — Telephone Encounter (Signed)
Called patient with those two options of waiting for Dr.Simpson or going to Urgent Care.  Pt said Thanks

## 2017-02-13 NOTE — Telephone Encounter (Signed)
No.  Can see Dr Moshe Cipro or go to urgent care

## 2017-02-13 NOTE — Telephone Encounter (Signed)
Patient calling to find out if she can get a shot for pain (back and leg) since she is to start work on Monday.  She is aware that Dr. Moshe Cipro is not here until Oct 1st.

## 2017-02-18 ENCOUNTER — Telehealth: Payer: Self-pay | Admitting: Family Medicine

## 2017-02-18 NOTE — Telephone Encounter (Signed)
Patient left message on voice mail @ 3:32 requesting Rx Valium and injections for back and neck.  I returned call and left voice mail to call back

## 2017-02-19 NOTE — Telephone Encounter (Signed)
Noted. Also called patient, no answer. She will have to wait for Dr. Griffin Dakin return on 10/1

## 2017-03-26 ENCOUNTER — Other Ambulatory Visit: Payer: Self-pay | Admitting: Family Medicine

## 2017-04-03 ENCOUNTER — Encounter: Payer: Self-pay | Admitting: Family Medicine

## 2017-04-03 ENCOUNTER — Ambulatory Visit (INDEPENDENT_AMBULATORY_CARE_PROVIDER_SITE_OTHER): Payer: No Typology Code available for payment source | Admitting: Family Medicine

## 2017-04-03 VITALS — BP 118/80 | HR 88 | Wt 179.0 lb

## 2017-04-03 DIAGNOSIS — M5431 Sciatica, right side: Secondary | ICD-10-CM

## 2017-04-03 DIAGNOSIS — Z23 Encounter for immunization: Secondary | ICD-10-CM

## 2017-04-03 DIAGNOSIS — J309 Allergic rhinitis, unspecified: Secondary | ICD-10-CM

## 2017-04-03 DIAGNOSIS — R52 Pain, unspecified: Secondary | ICD-10-CM

## 2017-04-03 MED ORDER — PREDNISONE 5 MG (21) PO TBPK: 5.0000 mg | ORAL_TABLET | ORAL | 0 refills | Status: DC

## 2017-04-03 MED ORDER — IBUPROFEN 800 MG PO TABS: 800.0000 mg | ORAL_TABLET | Freq: Three times a day (TID) | ORAL | 0 refills | Status: DC

## 2017-04-03 MED ORDER — HYDROCODONE-ACETAMINOPHEN 7.5-325 MG PO TABS: ORAL_TABLET | ORAL | 0 refills | Status: DC

## 2017-04-03 MED ORDER — RANITIDINE HCL 300 MG PO TABS: 300.0000 mg | ORAL_TABLET | Freq: Every day | ORAL | 0 refills | Status: DC

## 2017-04-03 NOTE — Patient Instructions (Signed)
Annual physical exam week of Jan 7 , call if you need me sooner  Flu vaccine today  Toradol and  Depo medrol in office today for acute back pain since last week Friday and 1 week course of ibuprofen and prednisone are prescribed, also zantac  You are being referred to orthopedics in [facility] for bilateral knee pain  Pain management as before

## 2017-04-04 ENCOUNTER — Encounter: Payer: Self-pay | Admitting: Family Medicine

## 2017-04-04 DIAGNOSIS — M5431 Sciatica, right side: Secondary | ICD-10-CM

## 2017-04-04 MED ORDER — METHYLPREDNISOLONE ACETATE 80 MG/ML IJ SUSP
80.0000 mg | Freq: Once | INTRAMUSCULAR | Status: AC
  Administered 2017-04-04: 80 mg via INTRAMUSCULAR

## 2017-04-04 MED ORDER — KETOROLAC TROMETHAMINE 60 MG/2ML IM SOLN
60.0000 mg | Freq: Once | INTRAMUSCULAR | Status: AC
Start: 2017-04-04 — End: 2017-04-04
  Administered 2017-04-04: 60 mg via INTRAMUSCULAR

## 2017-04-06 NOTE — Assessment & Plan Note (Signed)
The patient's Controlled Substance registry is reviewed and compliance confirmed. Adequacy of  Pain control and level of function is assessed. Medication dosing is adjusted as deemed appropriate. Twelve weeks of medication is prescribed , patient signs for the script and is provided with a follow up appointment between 11 to 12 weeks .  

## 2017-04-06 NOTE — Assessment & Plan Note (Signed)
Uncontrolled.Toradol and depo medrol administered IM in the office , to be followed by a short course of oral prednisone and NSAIDS.  

## 2017-04-06 NOTE — Progress Notes (Signed)
   Anna Cantu     MRN: [id number]      DOB: 02-10-1966   HPI [name] is here for follow up and re-evaluation of chronic medical conditions, in particular pain management, medication management and review of any available recent lab and radiology data.  Preventive health is updated, specifically  Cancer screening and Immunization.   Questions or concerns regarding consultations or procedures which the PT has had in the interim are  addressed. The PT denies any adverse reactions to current medications since the last visit.  C/o increased low back pain x 1 week, no trigger noted, pain radiates down legs, wants injections for this  ROS Denies recent fever or chills. Denies sinus pressure, nasal congestion, ear pain or sore throat. Denies chest congestion, productive cough or wheezing. Denies chest pains, palpitations and leg swelling Denies abdominal pain, nausea, vomiting,diarrhea or constipation.   Denies dysuria, frequency, hesitancy or incontinence.  Denies headaches, seizures, numbness, or tingling. Denies depression, anxiety or insomnia. Denies skin break down or rash.   PE  BP 118/80   Pulse 88   Wt 179 lb (81.2 kg)   LMP 07/01/2013   BMI 30.73 kg/m   Patient alert and oriented and in no cardiopulmonary distress.  HEENT: No facial asymmetry, EOMI,   oropharynx pink and moist.  Neck supple no JVD, no mass.  Chest: Clear to auscultation bilaterally.  CVS: S1, S2 no murmurs, no S3.Regular rate.  ABD: Soft non tender.   Ext: No edema  [id number] ROM lumbar  spine, adequate in shoulders, hips and knees.  Skin: Intact, no ulcerations or rash noted.  Psych: Good eye contact, normal affect. Memory intact not anxious or depressed appearing.  CNS: CN 2-12 intact, power,  normal throughout.no focal deficits noted.   Assessment & Plan  Encounter for pain management The patient's Controlled Substance registry is reviewed and compliance  confirmed. Adequacy of  Pain control and level of function is assessed. Medication dosing is adjusted as deemed appropriate. Twelve weeks of medication is prescribed , patient signs for the script and is provided with a follow up appointment between 11 to 12 weeks .   Back pain with right-sided sciatica Uncontrolled.Toradol and depo medrol administered IM in the office , to be followed by a short course of oral prednisone and NSAIDS.   Obesity Improved Patient re-educated about  the importance of commitment to a  minimum of 150 minutes of exercise per week.  The importance of healthy food choices with portion control discussed. Encouraged to start a food diary, count calories and to consider  joining a support group. Sample diet sheets offered. Goals set by the patient for the next several months.   Weight /BMI 04/03/2017 01/14/2017 10/07/2016  WEIGHT 179 lb 196 lb 202 lb  HEIGHT - 5\' 4"  5\' 4"   BMI 30.73 kg/m2 33.64 kg/m2 34.67 kg/m2      Allergic rhinitis Controlled, no change in medication

## 2017-04-06 NOTE — Assessment & Plan Note (Signed)
Improved Patient re-educated about  the importance of commitment to a  minimum of 150 minutes of exercise per week.  The importance of healthy food choices with portion control discussed. Encouraged to start a food diary, count calories and to consider  joining a support group. Sample diet sheets offered. Goals set by the patient for the next several months.   Weight /BMI 04/03/2017 01/14/2017 10/07/2016  WEIGHT 179 lb 196 lb 202 lb  HEIGHT - 5\' 4"  5\' 4"   BMI 30.73 kg/m2 33.64 kg/m2 34.67 kg/m2

## 2017-04-06 NOTE — Assessment & Plan Note (Signed)
Controlled, no change in medication  

## 2017-04-15 ENCOUNTER — Other Ambulatory Visit: Payer: Self-pay

## 2017-04-15 ENCOUNTER — Telehealth: Payer: Self-pay | Admitting: Family Medicine

## 2017-04-15 MED ORDER — FLUCONAZOLE 150 MG PO TABS: ORAL_TABLET | ORAL | 0 refills | Status: DC

## 2017-04-15 NOTE — Telephone Encounter (Signed)
Patient requesting refill of fluconazole for yeast infection. Itching some discharge. Please advise

## 2017-04-15 NOTE — Telephone Encounter (Signed)
Med sent.

## 2017-04-15 NOTE — Telephone Encounter (Signed)
Patient is requesting an rx for yeast infection- she left a message on machine. Cb#: [phone]

## 2017-04-15 NOTE — Telephone Encounter (Signed)
pls send in fulconazole 150 mg one tablet

## 2017-04-29 ENCOUNTER — Telehealth: Payer: Self-pay | Admitting: Family Medicine

## 2017-04-29 NOTE — Telephone Encounter (Signed)
Patient left message requesting medication for nausea and a refill on her vallum  Cb#: [phone]

## 2017-04-30 ENCOUNTER — Other Ambulatory Visit: Payer: Self-pay

## 2017-04-30 NOTE — Telephone Encounter (Signed)
Aware that 3 refills of valium were sent on 11/1 Patient requesting med for occasional nausea also be sent in. Please advise

## 2017-05-01 ENCOUNTER — Other Ambulatory Visit: Payer: Self-pay | Admitting: Family Medicine

## 2017-05-01 MED ORDER — ONDANSETRON HCL 4 MG PO TABS: ORAL_TABLET | ORAL | 0 refills | Status: DC

## 2017-05-01 NOTE — Progress Notes (Signed)
zofran

## 2017-05-01 NOTE — Telephone Encounter (Signed)
zofran has been sent to [facility]

## 2017-05-28 ENCOUNTER — Encounter: Payer: No Typology Code available for payment source | Admitting: Family Medicine

## 2017-05-29 ENCOUNTER — Other Ambulatory Visit: Payer: Self-pay | Admitting: Family Medicine

## 2017-05-29 ENCOUNTER — Telehealth: Payer: Self-pay | Admitting: Family Medicine

## 2017-05-29 NOTE — Telephone Encounter (Signed)
Patient is requesting medication for nausea, she also states that her eyes feel griddy as if she has pink eye.  Cb# [phone]

## 2017-05-30 ENCOUNTER — Other Ambulatory Visit: Payer: Self-pay

## 2017-05-30 MED ORDER — ONDANSETRON HCL 4 MG PO TABS: ORAL_TABLET | ORAL | 0 refills | Status: DC

## 2017-05-30 NOTE — Telephone Encounter (Signed)
Left message to call back with her symptoms and which eye was affected and if she had any drainage, redness, swelling or vision changes and info would be sent to the dr

## 2017-06-25 ENCOUNTER — Emergency Department (HOSPITAL_COMMUNITY)
Admission: EM | Admit: 2017-06-25 | Discharge: 2017-06-25 | Disposition: A | Discharge status: Home / Self Care | Payer: No Typology Code available for payment source | Attending: Emergency Medicine | Admitting: Emergency Medicine

## 2017-06-25 ENCOUNTER — Encounter (HOSPITAL_COMMUNITY): Payer: Self-pay

## 2017-06-25 ENCOUNTER — Telehealth: Payer: Self-pay | Admitting: Family Medicine

## 2017-06-25 ENCOUNTER — Emergency Department (HOSPITAL_COMMUNITY): Payer: No Typology Code available for payment source

## 2017-06-25 DIAGNOSIS — Y9389 Activity, other specified: Secondary | ICD-10-CM | POA: Diagnosis not present

## 2017-06-25 DIAGNOSIS — Y999 Unspecified external cause status: Secondary | ICD-10-CM | POA: Diagnosis not present

## 2017-06-25 DIAGNOSIS — S29011A Strain of muscle and tendon of front wall of thorax, initial encounter: Secondary | ICD-10-CM | POA: Diagnosis not present

## 2017-06-25 DIAGNOSIS — Y9289 Other specified places as the place of occurrence of the external cause: Secondary | ICD-10-CM | POA: Insufficient documentation

## 2017-06-25 DIAGNOSIS — J00 Acute nasopharyngitis [common cold]: Secondary | ICD-10-CM | POA: Diagnosis not present

## 2017-06-25 DIAGNOSIS — X58XXXA Exposure to other specified factors, initial encounter: Secondary | ICD-10-CM | POA: Diagnosis not present

## 2017-06-25 DIAGNOSIS — M545 Low back pain, unspecified: Secondary | ICD-10-CM

## 2017-06-25 DIAGNOSIS — S298XXA Other specified injuries of thorax, initial encounter: Secondary | ICD-10-CM | POA: Diagnosis present

## 2017-06-25 DIAGNOSIS — G8929 Other chronic pain: Secondary | ICD-10-CM

## 2017-06-25 DIAGNOSIS — Z79899 Other long term (current) drug therapy: Secondary | ICD-10-CM | POA: Diagnosis not present

## 2017-06-25 DIAGNOSIS — Z87891 Personal history of nicotine dependence: Secondary | ICD-10-CM | POA: Diagnosis not present

## 2017-06-25 LAB — I-STAT TROPONIN, ED: TROPONIN I, POC: 0 ng/mL (ref 0.00–0.08)

## 2017-06-25 LAB — CBC
HCT: 39.8 % (ref 36.0–46.0)
Hemoglobin: 12.9 g/dL (ref 12.0–15.0)
MCH: 30.1 pg (ref 26.0–34.0)
MCHC: 32.4 g/dL (ref 30.0–36.0)
MCV: 92.8 fL (ref 78.0–100.0)
PLATELETS: 321 10*3/uL (ref 150–400)
RBC: 4.29 MIL/uL (ref 3.87–5.11)
RDW: 13.9 % (ref 11.5–15.5)
WBC: 7.5 10*3/uL (ref 4.0–10.5)

## 2017-06-25 LAB — BASIC METABOLIC PANEL
Anion gap: 11 (ref 5–15)
BUN: 11 mg/dL (ref 6–20)
CO2: 24 mmol/L (ref 22–32)
CREATININE: 0.89 mg/dL (ref 0.44–1.00)
Calcium: 9.5 mg/dL (ref 8.9–10.3)
Chloride: 103 mmol/L (ref 101–111)
GFR calc Af Amer: >60 mL/min (ref >60)
Glucose, Bld: 102 mg/dL — ABNORMAL HIGH (ref 65–99)
Potassium: 4 mmol/L (ref 3.5–5.1)
SODIUM: 138 mmol/L (ref 135–145)

## 2017-06-25 MED ORDER — ASPIRIN 325 MG PO TABS
325.0000 mg | ORAL_TABLET | Freq: Once | ORAL | Status: AC
Start: 2017-06-25 — End: 2017-06-25
  Administered 2017-06-25: 325 mg via ORAL
  Filled 2017-06-25: qty 1

## 2017-06-25 MED ORDER — FLUTICASONE PROPIONATE 50 MCG/ACT NA SUSP: 2.0000 | Freq: Every day | NASAL | 0 refills | Status: DC

## 2017-06-25 MED ORDER — KETOROLAC TROMETHAMINE 60 MG/2ML IM SOLN
15.0000 mg | Freq: Once | INTRAMUSCULAR | Status: AC
Start: 2017-06-25 — End: 2017-06-25
  Administered 2017-06-25: 15 mg via INTRAMUSCULAR
  Filled 2017-06-25: qty 2

## 2017-06-25 NOTE — ED Provider Notes (Signed)
[facility] EMERGENCY DEPARTMENT Provider Note   CSN: [id number] Arrival date & time: 06/25/17  1005     History   Chief Complaint Chief Complaint  Patient presents with  . Chest Pain  . Fall    HPI Anna Cantu is a 52 y.o. female.  HPI   52 y/o F who presents to the ED c/o intermittent pain to inferior and left lateral aspect of the left breast that began on 1/28 while she was driving to work. Pain was sharp and started suddenly. Initially pain lasted for a few minutes, but resolved spontaneously after she massaged the area. Reports that it felt like a muscle spasm, and she thinks that she pulled a muscle while getting into the car. The pain radiated to left shoulder, left arm, and into back.  Has had 3 more episodes since then that have lasted for a few minutes.  States she has pain at rest now and it is 10/10. Movement and deep breaths make pain worse. Heat improves pain. Motrin and muscle relaxers do not improve pain.  Denies SOB, palpitations, leg swelling. Does report nasal congestion (x1 week), sore throat, postnasal drip, temp (100.29F), wheezing, and a dry cough (x3 days). No calf swelling, redness, or pain. No abd pain, NVD, diarrhea, constipation. No estrogen therapy or tobacco use. Denies a h/o blood clot or CA. Denies recent surgeries or trauma.   Does also report that yesterday she was walking and stepped off of a curb twisting her back. She denies that she fell to the ground and denies that she injured any other part of her body.  Now has worsening of her chronic low back pain that radiates down the RLE. States she has been ambulatory. Denies numbness/weakness to the BLE. Denies loss of control of bowels or bladder. No h/o CA.   Denies tobacco use, HTN, HLD. Denies a personal or family h/o heart disease.   Past Medical History:  Diagnosis Date  . Chronic back pain   . Gastritis   . Hemorrhoids   . Nicotine addiction   . Obesity   . Ovarian cyst      Patient Active Problem List   Diagnosis Date Noted  . Encounter for pain management 10/13/2016  . Arthralgia of both knees 05/23/2015  . Allergic rhinitis 03/23/2015  . Neck muscle spasm 10/11/2014  . Cervical high risk HPV (human papillomavirus) test positive 06/13/2014  . Back pain with right-sided sciatica 10/20/2013  . GERD (gastroesophageal reflux disease) 06/24/2013  . Fibroids 04/13/2013  . Neck pain on right side 04/13/2013  . 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 Panic anxiety syndrome 12/15/2009  . Vitamin D deficiency 11/14/2009  . Obesity 08/11/2007    Past Surgical History:  Procedure Laterality Date  . btl and endometrial abaltion    . CHOLECYSTECTOMY  2007   Dr. Aviva Signs   . COLONOSCOPY   08/19/2008   Dr. Madolyn Frieze canal hemorrhoids.  Diminutive rectal polyp  status post cold biopsy and removal.  The remainder of rectal mucosa and colon was normal. Path: polypoid.   Marland Kitchen COLONOSCOPY WITH ESOPHAGOGASTRODUODENOSCOPY (EGD) N/A 07/08/2013   RMR: Rectal and colonic polyps-removed ast described above. colonic diverticulosis. Hemorroids-likely source of hematochezia.  Small hiatal hernia . Antral erosions;nonbleeding duodenal AVM; otherwise negative EGD-status post gastric biopsy.  . ESOPHAGOGASTRODUODENOSCOPY   08/19/2008   Dr. Rourk:Distal esophageal erosions, consistent with erosive reflux esophagitis/small HH/The remainder of her upper GI tract appeared normal  . left knee surgey-  arthroscopy June 2011     Dr. Percell Miller   . right carpal tunnel release  1991  . Right Inguinal hernia repair  1982  . right knee surgery  1993  . TUBAL LIGATION      OB History    No data available       Home Medications    Prior to Admission medications   Medication Sig Start Date End Date Taking? Authorizing Provider  Ascorbic Acid (VITAMIN C) 1000 MG tablet Take 1,000 mg by mouth daily.   Yes [provider]  diazepam (VALIUM) 5 MG tablet TAKE 1  TABLET BY MOUTH AT BEDTIME AS NEEDED 03/27/17  Yes Fayrene Helper, MD  HYDROcodone-acetaminophen (NORCO) 7.5-325 MG tablet One tablet at bedtime, as needed, for uncontrolled pain Patient taking differently: Take 1 tablet by mouth daily as needed. One tablet at bedtime, as needed, for uncontrolled pain 04/03/17 10/04/17 Yes Fayrene Helper, MD  ibuprofen (ADVIL,MOTRIN) 800 MG tablet Take 1 tablet (800 mg total) 3 (three) times daily by mouth. 04/03/17  Yes Fayrene Helper, MD  Multiple Vitamin (MULITIVITAMIN WITH MINERALS) TABS Take 1 tablet by mouth daily.   Yes [provider]  ranitidine (ZANTAC) 300 MG tablet Take 1 tablet (300 mg total) at bedtime by mouth. 04/03/17  Yes Fayrene Helper, MD  UNABLE TO FIND Tumeric 2000mg  daily   Yes [provider]  fluconazole (DIFLUCAN) 150 MG tablet TAKE ONE TABLET BY MOUTH AS A ONE-TIME DOSE 04/15/17   Fayrene Helper, MD  fluticasone [facility]) 50 MCG/ACT nasal spray Place 2 sprays into both nostrils daily. 06/25/17   Aviella Disbrow S, PA-C  gabapentin (NEURONTIN) 100 MG capsule One in the morning and two at the bedtime 10/07/16   Fayrene Helper, MD    Family History Family History  Problem Relation Age of Onset  . Hypertension Mother   . Stroke Mother   . Hypertension Father   . Colon cancer Neg Hx     Social History Social History   Tobacco Use  . Smoking status: Former Smoker    Packs/day: 0.50    Types: Cigarettes  . Smokeless tobacco: Never Used  . Tobacco comment: quit Mar 03, 2013   Substance Use Topics  . Alcohol use: Yes    Comment: occasionally  . Drug use: No     Allergies   Patient has no known allergies.   Review of Systems Review of Systems  Constitutional: Negative for chills and fever.  HENT: Positive for congestion, postnasal drip and sore throat. Negative for ear pain.   Eyes: Negative for pain and visual disturbance.  Respiratory: Positive for cough and wheezing. Negative  for shortness of breath.   Cardiovascular: Negative for palpitations and leg swelling.       Left lateral and inferior breast pain worse with movement  Gastrointestinal: Negative for abdominal pain, constipation, diarrhea, nausea and vomiting.  Genitourinary: Negative for dysuria, frequency, hematuria, pelvic pain and urgency.  Musculoskeletal: Positive for back pain. Negative for arthralgias and gait problem.  Skin: Negative for color change and rash.  Neurological: Negative for seizures, syncope, weakness and numbness.  All other systems reviewed and are negative.    Physical Exam Updated Vital Signs BP 105/72   Pulse 63   Temp 98.9 F (37.2 C)   Resp 16   Ht 5\' 5"  (1.651 m)   Wt 86.2 kg (190 lb)   LMP 07/01/2013   SpO2 98%   BMI 31.62 kg/m  Physical Exam  Constitutional: She appears well-developed and well-nourished. No distress.  HENT:  Head: Normocephalic and atraumatic.  Eyes: Conjunctivae and EOM are normal.  Normal conjunctiva  Neck: Normal range of motion. Neck supple. No JVD present. No tracheal deviation present.  Cardiovascular: Normal rate, regular rhythm, intact distal pulses and normal pulses. Exam reveals no friction rub.  No murmur heard. Pulmonary/Chest: Effort normal and breath sounds normal. No accessory muscle usage or stridor. No tachypnea. No respiratory distress. She has no decreased breath sounds. She has no wheezes. She has no rhonchi. She has no rales.  Abdominal: Soft. Bowel sounds are normal. She exhibits no distension and no mass. There is no tenderness. There is no guarding.  Musculoskeletal: She exhibits no edema.  Moderate TTP along left lateral chest wall that reproduces her pain.  Also with TTP along muscles to inferolateral aspect of left scapula. Pt has subjective pain and reproduction of pain when raising the left arm. No midline or paraspinous TTP. No calf TTP, erythema, or edema.   Neurological: She is alert.  Normal tone. 5/5 strength  of BLE major muscle groups including strong and equal dorsiflexion/plantar flexion Sensory: light touch normal in all extremities. CV: 2+ radial and DP/PT pulses  Skin: Skin is warm and dry. Capillary refill takes less than 2 seconds.  Psychiatric: She has a normal mood and affect.  Nursing note and vitals reviewed.    ED Treatments / Results  Labs (all labs ordered are listed, but only abnormal results are displayed) Labs Reviewed  BASIC METABOLIC PANEL - Abnormal; Notable for the following components:      Result Value   Glucose, Bld 102 (*)    All other components within normal limits  CBC  I-STAT TROPONIN, ED    EKG  EKG Interpretation  Date/Time:  Wednesday June 25 2017 10:27:17 EST Ventricular Rate:  71 PR Interval:    QRS Duration: 98 QT Interval:  385 QTC Calculation: 419 R Axis:   59 Text Interpretation:  Sinus rhythm no acute st/ts similar to prior 10/14 Confirmed by Aletta Edouard [phone]) on 06/25/2017 10:59:29 AM       Radiology Dg Chest 2 View  Result Date: 06/25/2017 CLINICAL DATA:  Chest congestion for 1 week. Left-sided chest pain for 2 days. EXAM: CHEST  2 VIEW COMPARISON:  03/14/2013 FINDINGS: Lateral view degraded by patient arm position. Midline trachea. Normal heart size and mediastinal contours. No pleural effusion or pneumothorax. Clear lungs. IMPRESSION: No acute cardiopulmonary disease. Electronically Signed   By: Abigail Miyamoto M.D.   On: 06/25/2017 10:57    Procedures Procedures (including critical care time)  Medications Ordered in ED Medications  aspirin tablet 325 mg (325 mg Oral Given 06/25/17 1122)  ketorolac (TORADOL) injection 15 mg (15 mg Intramuscular Given 06/25/17 1304)     Initial Impression / Assessment and Plan / ED Course  I have reviewed the triage vital signs and the nursing notes.  Pertinent labs & imaging results that were available during my care of the patient were reviewed by me and considered in my medical  decision making (see chart for details).  Clinical Course as of Jun 25 1329  Wed Jan 30, 836  250 52 year old female with no prior history of cardiac disease complaining of some left-sided chest wall spasms and pain.  She states she has been taking Valium and Vicodin without relief and called her PCP today because of this worsening symptoms and was told to go to the emergency department.  Patient states she has had these spasms before.  She was hoping to get a shot for the pain and a steroid.  Her exam is benign her initial EKG and first troponin are unremarkable.  [MB]    Clinical Course User Index [MB] Hayden Rasmussen, MD   10:55 Evaluated pt in ED. Hr 65. BP 124/79. Stting at 100% on RA. In NAD.   Rechecked pt. States she feels somewhat improved after ASA, however still does have some pain. She is requesting a shot for pain and a steroid shot. VSS and she is sitting in the room in NAD.  Discussed laboratory, ECG and imaging results with pt.  Discussed plan for discharge with outpatient follow up in 5-7 days. Advised pt to return to the ER if they experience any new or worsening symptoms. Pt understands and agrees with the plan. All questions answered.   Final Clinical Impressions(s) / ED Diagnoses   Final diagnoses:  Muscle strain of chest wall, initial encounter  Acute exacerbation of chronic low back pain  Acute nasopharyngitis    Patient is to be discharged with recommendation to follow up with PCP in regards to today's hospital visit. Chest pain is not likely of cardiac or pulmonary etiology d/t presentation, Wells criteria for PE negative, VSS, no tracheal deviation, no JVD or new murmur, RRR, breath sounds equal bilaterally, EKG without acute abnormalities, negative troponin, and negative CXR. Pain is reproduced movement, and with palpation along lateral aspect of left chest wall and inferior scapular musculature. Pt has been advised to return to the ED if CP becomes exertional,  associated with diaphoresis or nausea, radiates to left jaw/arm, worsens or becomes concerning in any way. Pt appears reliable for follow up and is agreeable to discharge.   Patient also with acute exacerbation of her chronic back pain. No recent falls or trauma. No neurological deficits and normal neuro exam.  Patient can walk but states is painful.  No loss of bowel or bladder control.  No concern for cauda equina.  No fever, night sweats, weight loss, h/o cancer, IVDU.  RICE protocol and pain medicine indicated and discussed with patient.   Also Pt c/o URI sxs for about 1 week. CXR negative for acute infiltrate. Patients symptoms are consistent with URI, likely viral etiology. Discussed that antibiotics are not indicated for viral infections. Pt will be discharged with symptomatic treatment.  Verbalizes understanding and is agreeable with plan. Pt is hemodynamically stable & in NAD prior to dc.  Case has been discussed with and seen by Dr. Melina Copa who agrees with the above plan to discharge.    ED Discharge Orders        Ordered    fluticasone [facility]) 50 MCG/ACT nasal spray  Daily     06/25/17 1252       Rodney Booze, PA-C 06/25/17 1331    Hayden Rasmussen, MD 06/25/17 1935

## 2017-06-25 NOTE — ED Triage Notes (Signed)
Pt reports left sided chest pain and left arm pain since Monday.  Reports pain worse with movement.  Reports congestion x 1 week.  Yesterday you tripped over a curb and jerked to prevent from falling and now c/o pain to r side of body and back.

## 2017-06-25 NOTE — Discharge Instructions (Signed)
Please follow up with your primary doctor within the next 3-5 days. Please return to the ER sooner if you have any new or worsening symptoms including fevers, loss of control of your bowels/bladder, weakness to the legs, chest pain, shortness of breath.

## 2017-06-25 NOTE — Telephone Encounter (Signed)
When patient called this morning her complaint was left sided chest pain and left arm pain, I directed her to ER.

## 2017-06-25 NOTE — Telephone Encounter (Signed)
Patient is requesting to speak to a nurse or Dr.Simpson about her ER visit today. She says she went for congestion but they only gave her Flonase. Cb#: [phone]

## 2017-06-27 NOTE — Telephone Encounter (Signed)
pls call pt and document her concern, offer f/u appt next week with me if neeed to be seen, thanks

## 2017-07-01 ENCOUNTER — Telehealth: Payer: Self-pay | Admitting: Family Medicine

## 2017-07-01 NOTE — Telephone Encounter (Signed)
Pt called in advised she had Fever, chills, Diarrhea, Body aches, Headaches... I advised that I could schedule her an appointment with another Dr in the practice, as Dr Moshe Cipro was Full, and she wanted to sch for the next day. I advised the sch was full, and she could call first thing in the morning if she was still sick for any open Sick visit slots.

## 2017-07-02 ENCOUNTER — Ambulatory Visit (INDEPENDENT_AMBULATORY_CARE_PROVIDER_SITE_OTHER): Payer: No Typology Code available for payment source | Admitting: Family Medicine

## 2017-07-02 ENCOUNTER — Encounter: Payer: Self-pay | Admitting: Family Medicine

## 2017-07-02 VITALS — BP 118/80 | HR 74 | Temp 99.5°F | Resp 16 | Ht 64.0 in | Wt 194.0 lb

## 2017-07-02 DIAGNOSIS — B349 Viral infection, unspecified: Secondary | ICD-10-CM | POA: Diagnosis not present

## 2017-07-02 DIAGNOSIS — N644 Mastodynia: Secondary | ICD-10-CM | POA: Diagnosis not present

## 2017-07-02 DIAGNOSIS — J029 Acute pharyngitis, unspecified: Secondary | ICD-10-CM

## 2017-07-02 DIAGNOSIS — M5431 Sciatica, right side: Secondary | ICD-10-CM

## 2017-07-02 DIAGNOSIS — Z1231 Encounter for screening mammogram for malignant neoplasm of breast: Secondary | ICD-10-CM

## 2017-07-02 DIAGNOSIS — K529 Noninfective gastroenteritis and colitis, unspecified: Secondary | ICD-10-CM | POA: Diagnosis not present

## 2017-07-02 DIAGNOSIS — R0789 Other chest pain: Secondary | ICD-10-CM

## 2017-07-02 LAB — POCT RAPID STREP A (OFFICE): RAPID STREP A SCREEN: NEGATIVE

## 2017-07-02 MED ORDER — TIZANIDINE HCL 4 MG PO TABS: 4.0000 mg | ORAL_TABLET | Freq: Four times a day (QID) | ORAL | 0 refills | Status: DC | PRN

## 2017-07-02 MED ORDER — KETOROLAC TROMETHAMINE 10 MG PO TABS: 10.0000 mg | ORAL_TABLET | Freq: Four times a day (QID) | ORAL | 0 refills | Status: DC | PRN

## 2017-07-02 NOTE — Telephone Encounter (Signed)
Pt worked in

## 2017-07-02 NOTE — Progress Notes (Signed)
   Anna Cantu     MRN: [id number]      DOB: 1965-10-09   HPI Ms. Colclasure is here for follow up of recent Ed vist last week on 01/30 when she experienced left breast/ chest pain wqhile driving rated at a 10, ruled out for ACS, now c/o left breast pain feels heavy worse with deep breathing and leaning forward, also localized, also  radiates around to the back. Does not have a rash c/o sore throat and left earache No cough , h/o fever to 102 in past 2 days , no sputum c/o body aches  And chills.  2 day h/o excess n/ no vomit, loose stool x 2 days up to ove 6 yesterday   Today, 2 watery stool today 1 week h/o breast pain localized to 3 o clock position   ROS See HPI   . Denies chest congestion, productive cough or wheezing. Denies , palpitations and leg swelling .   Denies dysuria, frequency, hesitancy or incontinence. Denies joint pain, swelling and limitation in mobility. Denies headaches, seizures, numbness, or tingling. Denies depression, anxiety or insomnia. Denies skin break down or rash.   PE  BP 118/80   Pulse 74   Temp 99.5 F (37.5 C) (Oral)   Resp 16   Ht 5\' 4"  (1.626 m)   Wt 194 lb (88 kg)   LMP 07/01/2013   SpO2 99%   BMI 33.30 kg/m   Patient alert and oriented and in no cardiopulmonary distress.ill appearing  HEENT: No facial asymmetry, EOMI,   oropharynx pink and moist.No erythema or exudate  Neck supple no JVD, no mass.TM clear. Sinuses non tender  Chest: Clear to auscultation bilaterally.Reproducible localized chest wall pain at CC junctions 4 and 5 on the left   Breast: left tender at 3 o clock position, no palpable mass or nipple d/c or inversion , no lymphadenopathy. Right breast exam is normal  CVS: S1, S2 no murmurs, no S3.Regular rate.  ABD: Soft diffuse superficial tenderness, no guarding or rebound, hyperactive BS  Ext: No edema  MS: Adequate ROM spine, shoulders, hips and knees.  Skin: Intact, no ulcerations or rash  noted.  Psych: Good eye contact, normal affect. Memory intact not anxious or depressed appearing.  CNS: CN 2-12 intact, power,  normal throughout.no focal deficits noted.   Assessment & Plan  Acute gastroenteritis Symptomatic treatment with nausea med which she reports having and lomotil prescribed. Pt educated regarding the illness,and work excuse provided t return on 07/04/2017  Sore throat Normal exam and rapid strep test is negative  Breast pain, left 1 week history, localized tenderness at 3 o'clock position, needs diagnostic mammogram  Viral illness H/o acute fever , body aches, chills wit GE, , no significant respirator symtoms  Acute chest wall pain Short course of oral toradol prescribed  Back pain with right-sided sciatica Muscle relaxant prescribed for spasm as needed

## 2017-07-02 NOTE — Patient Instructions (Addendum)
Physical and pap in March, call if you need me soopner  Throat swab today   Ear exam is normal  You have chest wall pain and toradol tablets are sent in , also medication for back spasm, zanaflex is prescribed   You are referred for diagnostic mammogram due to left breast  pain, you may go to the radiology dept after you leave here to schedule an appointment, or ask [place] to call you with one  For acute gastroenteritis, you report having ttablets for nausea, pleas e use as directed, and lomotil is prescribed.  Work excuse for the day you went to the ED, also for yesterday, today and tomorrow to return to work on 07/04/2017

## 2017-07-09 ENCOUNTER — Telehealth: Payer: Self-pay | Admitting: Family Medicine

## 2017-07-09 ENCOUNTER — Ambulatory Visit (INDEPENDENT_AMBULATORY_CARE_PROVIDER_SITE_OTHER): Payer: No Typology Code available for payment source | Admitting: Gastroenterology

## 2017-07-09 ENCOUNTER — Encounter: Payer: Self-pay | Admitting: Gastroenterology

## 2017-07-09 VITALS — BP 112/74 | HR 66 | Temp 97.2°F | Ht 64.0 in | Wt 199.8 lb

## 2017-07-09 DIAGNOSIS — K649 Unspecified hemorrhoids: Secondary | ICD-10-CM | POA: Insufficient documentation

## 2017-07-09 DIAGNOSIS — A048 Other specified bacterial intestinal infections: Secondary | ICD-10-CM | POA: Diagnosis not present

## 2017-07-09 DIAGNOSIS — K219 Gastro-esophageal reflux disease without esophagitis: Secondary | ICD-10-CM | POA: Diagnosis not present

## 2017-07-09 DIAGNOSIS — R0789 Other chest pain: Secondary | ICD-10-CM | POA: Insufficient documentation

## 2017-07-09 DIAGNOSIS — K648 Other hemorrhoids: Secondary | ICD-10-CM | POA: Insufficient documentation

## 2017-07-09 DIAGNOSIS — K625 Hemorrhage of anus and rectum: Secondary | ICD-10-CM | POA: Diagnosis not present

## 2017-07-09 DIAGNOSIS — R1032 Left lower quadrant pain: Secondary | ICD-10-CM | POA: Diagnosis not present

## 2017-07-09 DIAGNOSIS — R197 Diarrhea, unspecified: Secondary | ICD-10-CM | POA: Insufficient documentation

## 2017-07-09 DIAGNOSIS — K529 Noninfective gastroenteritis and colitis, unspecified: Secondary | ICD-10-CM | POA: Insufficient documentation

## 2017-07-09 DIAGNOSIS — N644 Mastodynia: Secondary | ICD-10-CM | POA: Insufficient documentation

## 2017-07-09 DIAGNOSIS — B349 Viral infection, unspecified: Secondary | ICD-10-CM | POA: Insufficient documentation

## 2017-07-09 MED ORDER — CIPROFLOXACIN HCL 500 MG PO TABS: 500.0000 mg | ORAL_TABLET | Freq: Two times a day (BID) | ORAL | 0 refills | Status: DC

## 2017-07-09 MED ORDER — PANTOPRAZOLE SODIUM 40 MG PO TBEC: 40.0000 mg | DELAYED_RELEASE_TABLET | Freq: Every day | ORAL | 5 refills | Status: DC

## 2017-07-09 MED ORDER — METRONIDAZOLE 500 MG PO TABS: 500.0000 mg | ORAL_TABLET | Freq: Three times a day (TID) | ORAL | 0 refills | Status: DC

## 2017-07-09 MED ORDER — HYDROCORTISONE 2.5 % RE CREA: 1.0000 "application " | TOPICAL_CREAM | Freq: Two times a day (BID) | RECTAL | 0 refills | Status: DC

## 2017-07-09 NOTE — Progress Notes (Signed)
Primary Care Physician:  Fayrene Helper, MD  Primary Gastroenterologist:  Garfield Cornea, MD   Chief Complaint  Patient presents with  . Abdominal Pain  . Diarrhea    HPI:  Anna Cantu is a 52 y.o. female here for further evaluation of diarrhea and abdominal pain.   At baseline, usually one solid stool daily. Diarrhea started about 10 days ago. Saw PCP who suspected gastroenteritis. By the weekend, diarrhea resolved and no stool for two days. However, last three days recurrent diarrhea, brbpr. About 5 stools per day. Monday some nocturnal diarrhea. No fever. Abdominal cramping and lower abd pain especially LLQ. Nausea, and loss of appetite. No melena. Denies ill contacts or recent antibiotics.   Complains of hemorrhoid flare with bleeding and rectal pain. Tried suppository last night. Reflux flared up as well. Chronically has rectal bleeding at least twice per month. Patient has h/o H.pylori treatment failure and was referred to ID several years ago but she never went. Previously listed amoxicillin as allergy causing rash/itching but this was removed from her allergy list in 2015.  Initially treated with prevacid, biaxin, flagyl but she did not complete regimen.   Patient was seen in ED on 06/25/17 with left sided chest pain.  Trop negative X 1, cbc, met 7 as below. cxr and ekg reported normal.   Current Outpatient Medications  Medication Sig Dispense Refill  . Ascorbic Acid (VITAMIN C) 1000 MG tablet Take 1,000 mg by mouth daily.    . diazepam (VALIUM) 5 MG tablet TAKE 1 TABLET BY MOUTH AT BEDTIME AS NEEDED 30 tablet 2  . HYDROcodone-acetaminophen (NORCO) 7.5-325 MG tablet One tablet at bedtime, as needed, for uncontrolled pain (Patient taking differently: Take 1 tablet by mouth daily as needed. One tablet at bedtime, as needed, for uncontrolled pain) 30 tablet 0  . ketorolac (TORADOL) 10 MG tablet Take 1 tablet (10 mg total) by mouth every 6 (six) hours as needed. 20 tablet 0   . Multiple Vitamin (MULITIVITAMIN WITH MINERALS) TABS Take 1 tablet by mouth daily.    Marland Kitchen UNABLE TO FIND Tumeric 2032m daily    . ciprofloxacin (CIPRO) 500 MG tablet Take 1 tablet (500 mg total) by mouth 2 (two) times daily. 20 tablet 0  . hydrocortisone (ANUSOL-HC) 2.5 % rectal cream Place 1 application rectally 2 (two) times daily. 30 g 0  . metroNIDAZOLE (FLAGYL) 500 MG tablet Take 1 tablet (500 mg total) by mouth 3 (three) times daily. 30 tablet 0  . pantoprazole (PROTONIX) 40 MG tablet Take 1 tablet (40 mg total) by mouth daily before breakfast. 30 tablet 5   No current facility-administered medications for this visit.     Allergies as of 07/09/2017  . (No Known Allergies)    Past Medical History:  Diagnosis Date  . Chronic back pain   . Gastritis   . Hemorrhoids   . Nicotine addiction   . Obesity   . Ovarian cyst     Past Surgical History:  Procedure Laterality Date  . btl and endometrial abaltion    . CHOLECYSTECTOMY  2007   Dr. MAviva Signs  . COLONOSCOPY   08/19/2008   Dr. RMadolyn Friezecanal hemorrhoids.  Diminutive rectal polyp  status post cold biopsy and removal.  The remainder of rectal mucosa and colon was normal. Path: polypoid.   .Marland KitchenCOLONOSCOPY WITH ESOPHAGOGASTRODUODENOSCOPY (EGD) N/A 07/08/2013   RMR: Rectal and colonic polyps-removed ast described above. colonic diverticulosis. Hemorroids-likely source of hematochezia.  Small hiatal hernia .  Antral erosions;nonbleeding duodenal AVM; otherwise negative EGD-status post gastric biopsy.  . ESOPHAGOGASTRODUODENOSCOPY   08/19/2008   Dr. Rourk:Distal esophageal erosions, consistent with erosive reflux esophagitis/small HH/The remainder of her upper GI tract appeared normal  . left knee surgey- arthroscopy June 2011     Dr. Percell Miller   . right carpal tunnel release  1991  . Right Inguinal hernia repair  1982  . right knee surgery  1993  . TUBAL LIGATION      Family History  Problem Relation Age of Onset  .  Hypertension Mother   . Stroke Mother   . Hypertension Father   . Colon cancer Neg Hx     Social History   Socioeconomic History  . Marital status: Divorced    Spouse name: Not on file  . Number of children: Not on file  . Years of education: Not on file  . Highest education level: Not on file  Social Needs  . Financial resource strain: Not on file  . Food insecurity - worry: Not on file  . Food insecurity - inability: Not on file  . Transportation needs - medical: Not on file  . Transportation needs - non-medical: Not on file  Occupational History  . Not on file  Tobacco Use  . Smoking status: Former Smoker    Packs/day: 0.50    Types: Cigarettes  . Smokeless tobacco: Never Used  . Tobacco comment: quit Mar 03, 2013   Substance and Sexual Activity  . Alcohol use: Yes    Comment: occasionally  . Drug use: No  . Sexual activity: Yes    Birth control/protection: Surgical  Other Topics Concern  . Not on file  Social History Narrative  . Not on file      ROS:  General: Negative for anorexia, weight loss, fever, chills, fatigue, weakness. Eyes: Negative for vision changes.  ENT: Negative for hoarseness, difficulty swallowing , nasal congestion. CV: Negative for chest pain, angina, palpitations, dyspnea on exertion, peripheral edema.  Respiratory: Negative for dyspnea at rest, dyspnea on exertion, cough, sputum, wheezing.  GI: See history of present illness. GU:  Negative for dysuria, hematuria, urinary incontinence, urinary frequency, nocturnal urination.  MS: Negative for joint pain, low back pain.  Derm: Negative for rash or itching.  Neuro: Negative for weakness, abnormal sensation, seizure, frequent headaches, memory loss, confusion.  Psych: Negative for anxiety, depression, suicidal ideation, hallucinations.  Endo: Negative for unusual weight change.  Heme: Negative for bruising or bleeding. Allergy: Negative for rash or hives.    Physical Examination:  BP  112/74   Pulse 66   Temp (!) 97.2 F (36.2 C) (Oral)   Ht _0  (1.626 m)   Wt 199 lb 12.8 oz (90.6 kg)   LMP 07/01/2013   BMI 34.30 kg/m    General: Well-nourished, well-developed in no acute distress.  Head: Normocephalic, atraumatic.   Eyes: Conjunctiva pink, no icterus. Mouth: Oropharyngeal mucosa moist and pink , no lesions erythema or exudate. Neck: Supple without thyromegaly, masses, or lymphadenopathy.  Lungs: Clear to auscultation bilaterally.  Heart: Regular rate and rhythm, no murmurs rubs or gallops.  Abdomen: Bowel sounds are normal, mild llq tenderness, nondistended, no hepatosplenomegaly or masses, no abdominal bruits or    hernia , no rebound or guarding.   Rectal: palpable tender internal hemorrhoids. No stool present. Secretions heme negative.  Extremities: No lower extremity edema. No clubbing or deformities.  Neuro: Alert and oriented x 4 , grossly normal neurologically.  Skin: Warm  and dry, no rash or jaundice.   Psych: Alert and cooperative, normal mood and affect.  Labs: Lab Results  Component Value Date   CREATININE 0.89 06/25/2017   BUN 11 06/25/2017   NA 138 06/25/2017   K 4.0 06/25/2017   CL 103 06/25/2017   CO2 24 06/25/2017   Lab Results  Component Value Date   WBC 7.5 06/25/2017   HGB 12.9 06/25/2017   HCT 39.8 06/25/2017   MCV 92.8 06/25/2017   PLT 321 06/25/2017     Imaging Studies: Dg Chest 2 View  Result Date: 06/25/2017 CLINICAL DATA:  Chest congestion for 1 week. Left-sided chest pain for 2 days. EXAM: CHEST  2 VIEW COMPARISON:  03/14/2013 FINDINGS: Lateral view degraded by patient arm position. Midline trachea. Normal heart size and mediastinal contours. No pleural effusion or pneumothorax. Clear lungs. IMPRESSION: No acute cardiopulmonary disease. Electronically Signed   By: Abigail Miyamoto M.D.   On: 06/25/2017 10:57

## 2017-07-09 NOTE — Patient Instructions (Addendum)
1. Collect stool for H.pylori as soon as possible. AFTER you collect the stool, then you can start Cipro/Flagyl for diarrhea and pantoprazole for acid reflux. IF you collect the stool after starting these medications, the result could be inaccurate.  2. Start Anusol cream twice a day for hemorrhoids.  3. WHILE ON CIPRO YOU CANNOT TAKE TIZANIDINE (ZANAFLEX)!!!!!!!!!!!!!!!!! 4. Return to the office in four weeks.

## 2017-07-09 NOTE — Assessment & Plan Note (Signed)
1 week history, localized tenderness at 3 o'clock position, needs diagnostic mammogram

## 2017-07-09 NOTE — Assessment & Plan Note (Signed)
Normal exam and rapid strep test is negative

## 2017-07-09 NOTE — Assessment & Plan Note (Signed)
Short course of oral toradol prescribed

## 2017-07-09 NOTE — Assessment & Plan Note (Signed)
H/o acute fever , body aches, chills wit GE, , no significant respirator symtoms

## 2017-07-09 NOTE — Assessment & Plan Note (Signed)
Muscle relaxant prescribed for spasm as needed

## 2017-07-09 NOTE — Telephone Encounter (Signed)
Patient wants to know if she can get an rx for suppository and diarrhea. She still has diarrhea, lasting since before her last appt. She states she is having bleeding but she knows its hemmoriods because she can feel them.  She knows Dr.Simpson is not available  and she states she does have a Office manager Dr. However when I told her she may need to call them she asked me to still forward you this message.  Cb#: [phone] Pharmacy: [facility]

## 2017-07-09 NOTE — Assessment & Plan Note (Signed)
Symptomatic treatment with nausea med which she reports having and lomotil prescribed. Pt educated regarding the illness,and work excuse provided t return on 07/04/2017

## 2017-07-10 ENCOUNTER — Encounter: Payer: Self-pay | Admitting: Gastroenterology

## 2017-07-10 NOTE — Assessment & Plan Note (Signed)
Topical regimen for hemorrhoid flare. We briefly discussed possibility of hemorrhoid banding at some time in the future after acute symptoms have resolved.

## 2017-07-10 NOTE — Progress Notes (Signed)
CC'D TO PCP °

## 2017-07-10 NOTE — Assessment & Plan Note (Signed)
Recent flare of heartburn. No longer on medication. Prior h/o of H.pylori failed initial treatment and she never follow up with ID.   At this point, check H.pylori stool antigen. After stool collected, she will start pantoprazole once daily.

## 2017-07-10 NOTE — Assessment & Plan Note (Signed)
10 day history of persistent diarrhea, acute on chronic rectal bleeding (likely hemorrhoid related) and LLQ discomfort. Empirically treat with course of Cipro/Flgayl. Patient may have protracted gastroenteritis, less likely diverticulitis. If no significant improvement, she will require full stool studies +/- CT A/P +/- colonoscopy. Return for follow up in 4 weeks.

## 2017-07-14 NOTE — Telephone Encounter (Signed)
Noted. Was seen by GI

## 2017-07-15 LAB — HELICOBACTER PYLORI  SPECIAL ANTIGEN
MICRO NUMBER:: 90212259
SPECIMEN QUALITY: ADEQUATE

## 2017-07-16 ENCOUNTER — Telehealth: Payer: Self-pay

## 2017-07-16 MED ORDER — FLUCONAZOLE 150 MG PO TABS: ORAL_TABLET | ORAL | 0 refills | Status: DC

## 2017-07-16 NOTE — Addendum Note (Signed)
Addended by: Mahala Menghini on: 07/16/2017 11:29 PM   Modules accepted: Orders

## 2017-07-16 NOTE — Telephone Encounter (Signed)
Pt left a VM in reference to stool study results.  Called pt back, pt is taking Cipro and Flagyl as directed. Pt started last week and today would be her sixth day on medication. Pt broke out in a full body rash last night. Pt took 2 benadryl tablets and feels that most of the rash has resolved this morning. Pt did see her pcp last week due to her throat feeling scratchy and a strep test was done. Results for strep were negative. Pt also started having chills, body aches, sore throat and a cough last night. Pt does feel worse today. Pt isn't sure if she's having an allergic reaction to Cipro or Flagyl.   Pt also would like a medication for yeast called in, pt forgot to mention she gets yeast infections when taking antibiotics. Pt isn't sure if the yeast caused her itching last night.

## 2017-07-16 NOTE — Telephone Encounter (Signed)
Suggest patient seen PCP again for body aches/chills/sore throat and cough and rash.   Difficult to say if rash due to cipro/flagyl. ?viral related or scarlett fever rash (less likely). Yeast is not cause of full body rash.   I have sent in diflucan for yeast.  I would advise stopping cipro/flagyl for now.  How is her diarrhea and LLQ pain? Please ask patient if she collected her stool PRIOR to starting cipro/flagyl?

## 2017-07-17 NOTE — Telephone Encounter (Signed)
Lmom, waiting on a return call.  

## 2017-07-18 NOTE — Telephone Encounter (Signed)
Lmom, waiting on a return call.  

## 2017-07-20 NOTE — Progress Notes (Signed)
Her H.pylori stool antigen was negative. As long as she collected stool prior to starting cipro/flagyl/pantoprazole as advised at time of OV, her H.pylori was adequately treated.   She can start pantoprazole at any time if she hasn't already.

## 2017-07-21 ENCOUNTER — Telehealth: Payer: Self-pay | Admitting: Internal Medicine

## 2017-07-21 NOTE — Telephone Encounter (Signed)
lmom pt notified.

## 2017-07-21 NOTE — Telephone Encounter (Signed)
Tried calling pt, no answer. Will try again.

## 2017-07-21 NOTE — Telephone Encounter (Signed)
Pt called to say that she can only call when she is at lunch. She wants to know if something had been called in for her yeast problem and also checking to see if her lab results were back. Please call [phone] and leave a VM if she isn't able to answer.

## 2017-07-21 NOTE — Telephone Encounter (Signed)
Lmom, per pts request. Pt can call back if needed.

## 2017-07-21 NOTE — Progress Notes (Signed)
Called. Many rings and no answer.  

## 2017-07-22 NOTE — Progress Notes (Signed)
LMOM to call.

## 2017-07-23 NOTE — Progress Notes (Signed)
Mailed letter for pt to call.

## 2017-07-27 ENCOUNTER — Encounter: Payer: Self-pay | Admitting: Gastroenterology

## 2017-07-29 ENCOUNTER — Encounter: Payer: Self-pay | Admitting: Family Medicine

## 2017-07-29 ENCOUNTER — Ambulatory Visit (INDEPENDENT_AMBULATORY_CARE_PROVIDER_SITE_OTHER): Payer: No Typology Code available for payment source | Admitting: Family Medicine

## 2017-07-29 ENCOUNTER — Telehealth: Payer: Self-pay | Admitting: Internal Medicine

## 2017-07-29 ENCOUNTER — Other Ambulatory Visit: Payer: Self-pay | Admitting: Family Medicine

## 2017-07-29 VITALS — BP 118/80 | HR 93 | Resp 16 | Ht 64.0 in | Wt 191.0 lb

## 2017-07-29 DIAGNOSIS — M5431 Sciatica, right side: Secondary | ICD-10-CM

## 2017-07-29 DIAGNOSIS — Z6833 Body mass index (BMI) 33.0-33.9, adult: Secondary | ICD-10-CM

## 2017-07-29 DIAGNOSIS — E6609 Other obesity due to excess calories: Secondary | ICD-10-CM | POA: Diagnosis not present

## 2017-07-29 DIAGNOSIS — N644 Mastodynia: Secondary | ICD-10-CM

## 2017-07-29 DIAGNOSIS — R87810 Cervical high risk human papillomavirus (HPV) DNA test positive: Secondary | ICD-10-CM | POA: Diagnosis not present

## 2017-07-29 DIAGNOSIS — R52 Pain, unspecified: Secondary | ICD-10-CM | POA: Diagnosis not present

## 2017-07-29 DIAGNOSIS — E66811 Obesity, class 1: Secondary | ICD-10-CM

## 2017-07-29 DIAGNOSIS — J309 Allergic rhinitis, unspecified: Secondary | ICD-10-CM | POA: Diagnosis not present

## 2017-07-29 DIAGNOSIS — M62838 Other muscle spasm: Secondary | ICD-10-CM

## 2017-07-29 MED ORDER — ACETAMINOPHEN 500 MG PO TABS: ORAL_TABLET | ORAL | 5 refills | Status: DC

## 2017-07-29 MED ORDER — MELOXICAM 15 MG PO TABS: 15.0000 mg | ORAL_TABLET | Freq: Every day | ORAL | 0 refills | Status: DC

## 2017-07-29 MED ORDER — FLUTICASONE PROPIONATE 50 MCG/ACT NA SUSP
1.0000 | Freq: Every day | NASAL | 3 refills | Status: DC
Start: 2017-07-29 — End: 2018-01-27

## 2017-07-29 MED ORDER — GABAPENTIN 100 MG PO CAPS: 100.0000 mg | ORAL_CAPSULE | Freq: Every day | ORAL | 5 refills | Status: DC

## 2017-07-29 MED ORDER — HYDROCODONE-ACETAMINOPHEN 7.5-325 MG PO TABS: ORAL_TABLET | ORAL | 0 refills | Status: DC

## 2017-07-29 MED ORDER — KETOROLAC TROMETHAMINE 60 MG/2ML IM SOLN
60.0000 mg | Freq: Once | INTRAMUSCULAR | Status: AC
  Administered 2017-07-29: 60 mg via INTRAMUSCULAR

## 2017-07-29 MED ORDER — LORATADINE 10 MG PO TABS: 10.0000 mg | ORAL_TABLET | Freq: Every day | ORAL | 5 refills | Status: DC

## 2017-07-29 MED ORDER — METHYLPREDNISOLONE ACETATE 80 MG/ML IJ SUSP
80.0000 mg | Freq: Once | INTRAMUSCULAR | Status: AC
  Administered 2017-07-29: 80 mg via INTRAMUSCULAR

## 2017-07-29 MED ORDER — DIAZEPAM 5 MG PO TABS: 5.0000 mg | ORAL_TABLET | Freq: Every evening | ORAL | 2 refills | Status: DC | PRN

## 2017-07-29 MED ORDER — PREDNISONE 20 MG PO TABS
ORAL_TABLET | ORAL | 0 refills | Status: DC
Start: 2017-07-29 — End: 2018-01-27

## 2017-07-29 NOTE — Progress Notes (Signed)
Fill Date ID Written Drug Qty Days Prescriber Rx # Pharmacy Refill Daily Dose * Pymt Type PMP  06/25/2017  1  04/03/2017  Hydrocodone-Acetamin 7.5-325  30 30 Ma Sim  [id number] [id number]  0 7.50 MME Comm Ins  [place]  06/24/2017  1  03/27/2017  Diazepam 5 Mg Tablet  30 30 Ma Sim  [id number] [id number]  2 0.50 LME Comm Ins  [place]  05/29/2017  1  04/03/2017  Hydrocodone-Acetamin 7.5-325  30 30 Ma Sim  [id number] [id number]  0 7.50 MME Comm Ins  [place]  05/07/2017  1  03/27/2017  Diazepam 5 Mg Tablet  30 30 Ma Sim  [id number] [id number]  1 0.50 LME Comm Ins  [place]  04/28/2017  1  04/03/2017  Hydrocodone-Acetamin 7.5-325  30 30 Ma Sim  [id number] [id number]  0 7.50 MME Comm Ins  [place]  04/05/2017  1  03/27/2017  Diazepam 5 Mg Tablet  30 30 Ma Sim  [id number] [id number]  0 0.50 LME Comm Ins  [place]  03/24/2017  3  01/14/2017  Hydrocodone-Acetamin 7.5-325  30 30 Ma Sim  [id number] [id number]  0 7.50 MME Comm Ins    02/20/2017  1  09/12/2016  Diazepam 5 Mg Tablet             Reviewed at visit

## 2017-07-29 NOTE — Telephone Encounter (Signed)
Pt was calling for her lab results. Please call her at [phone]

## 2017-07-29 NOTE — Patient Instructions (Addendum)
Keep appointment for physical exam as before  Toradol 60 mg and depo medrol 80 mg IM in office today for back pain and prednisone 20 mg prescribed for 5 days  New daily for back pain is meloxicam, ES tylenol and gabapentin  New for allergies is loratadine and Flonase  Please re schedule your mammogram , you need this  Thank you  for choosing [facility]. We consider it a privelige to serve you.  Delivering excellent health care in a caring and  compassionate way is our goal.  Partnering with you,  so that together we can achieve this goal is our strategy.

## 2017-07-29 NOTE — Telephone Encounter (Signed)
Lmom, waiting on a return call.  

## 2017-07-31 ENCOUNTER — Other Ambulatory Visit: Payer: No Typology Code available for payment source

## 2017-08-05 ENCOUNTER — Ambulatory Visit
Admission: RE | Admit: 2017-08-05 | Discharge: 2017-08-05 | Disposition: A | Discharge status: Home / Self Care | Payer: No Typology Code available for payment source | Source: Ambulatory Visit | Attending: Family Medicine | Admitting: Family Medicine

## 2017-08-05 ENCOUNTER — Ambulatory Visit: Payer: No Typology Code available for payment source

## 2017-08-05 DIAGNOSIS — N644 Mastodynia: Secondary | ICD-10-CM

## 2017-08-06 NOTE — Telephone Encounter (Signed)
Closing note out. Previous documentation on a different phone note.

## 2017-08-08 ENCOUNTER — Telehealth: Payer: Self-pay

## 2017-08-08 NOTE — Telephone Encounter (Signed)
GI noted in tele message that they would try to reach pt today per the voicemail she left them of her availability

## 2017-08-08 NOTE — Telephone Encounter (Signed)
Please call pt about her results 

## 2017-08-08 NOTE — Telephone Encounter (Signed)
Pt left VM that she can be reached during her lunch between 11:30-12:30. She received a letter when we could not reach her by phone. See notes of 07/12/2017 with H pylori results.

## 2017-08-11 NOTE — Telephone Encounter (Signed)
Pt is aware of her H Pylori results being negative. She said she thought she did start the antibiotics a day or two before she did her stool test. Sending FYI to Neil Crouch, PA.

## 2017-08-13 NOTE — Telephone Encounter (Signed)
LMOM to call.

## 2017-08-13 NOTE — Telephone Encounter (Signed)
I suspect it's ok but if she wants to be sure and repeat the H. Pylori stool antigen we can.   I gave her instructions at time of OV, it was outlined on her AVS provided to collect stool prior to starting antibiotics and PPI.   If she choosing to repeat testing, she will need to be off PPI, Pepto, antibiotics for 2 weeks before stool collection.

## 2017-08-14 ENCOUNTER — Encounter: Payer: No Typology Code available for payment source | Admitting: Family Medicine

## 2017-08-14 NOTE — Telephone Encounter (Signed)
Pt is feeling better and does not want to repeat the stool antigen now.

## 2017-08-15 ENCOUNTER — Ambulatory Visit: Payer: No Typology Code available for payment source | Admitting: Gastroenterology

## 2017-08-17 ENCOUNTER — Encounter: Payer: Self-pay | Admitting: Family Medicine

## 2017-08-17 NOTE — Assessment & Plan Note (Signed)
Improved. Patient re-educated about  the importance of commitment to a  minimum of 150 minutes of exercise per week.  The importance of healthy food choices with portion control discussed. Encouraged to start a food diary, count calories and to consider  joining a support group. Sample diet sheets offered. Goals set by the patient for the next several months.   Weight /BMI 07/29/2017 07/09/2017 07/02/2017  WEIGHT 191 lb 199 lb 12.8 oz 194 lb  HEIGHT 5\' 4"  5\' 4"  5\' 4"   BMI 32.79 kg/m2 34.3 kg/m2 33.3 kg/m2

## 2017-08-17 NOTE — Assessment & Plan Note (Signed)
Uncontrolled.Toradol and depo medrol administered IM in the office , to be followed by a short course of oral prednisone   

## 2017-08-17 NOTE — Assessment & Plan Note (Signed)
Continue bedtime diazepam

## 2017-08-17 NOTE — Progress Notes (Signed)
   Anna Cantu     MRN: [id number]      DOB: [birth year]   HPI [name] is here for follow up and re-evaluation of chronic medical conditions, medication management in particular chronic pain management  For her back , which she states is inadequate and needs to be reviewed , due to uncontrolled neck and back pain chronically. C/o increased allergy symptoms, not unexpected for this time of the year, and requests chronic medication be renewed, which is apropriate    Preventive health is updated, specifically  Cancer screening and Immunization.     ROS Denies recent fever or chills. Denies sinus pressure c/o increased clear nasal drainage and  nasal congestion, denies ear pain or sore throat. Denies chest congestion, productive cough or wheezing. Denies chest pains, palpitations and leg swelling Denies abdominal pain, nausea, vomiting,diarrhea or constipation.   Denies dysuriac/o back and knee pain and limitation in mobility. Denies headaches, seizures, numbness, or tingling. Denies uncontrolled  depression, anxiety or insomnia. Denies skin break down or rash.   PE  BP 118/80   Pulse 93   Resp 16   Ht 5\' 4"  (1.626 m)   Wt 191 lb (86.6 kg)   LMP 07/01/2013   SpO2 98%   BMI 32.79 kg/m   Patient alert and oriented and in no cardiopulmonary distress.  HEENT: No facial asymmetry, EOMI,   oropharynx pink and moist.  Neck decreased , though adequate ROM,, no JVD, no mass.  Chest: Clear to auscultation bilaterally.  CVS: S1, S2 no murmurs, no S3.Regular rate.  ABD: Soft non tender.   Ext: No edema  MS: decreased  ROM lumbar  spine,  and knees.normal in shoulders and hips  Skin: Intact, no ulcerations or rash noted.  Psych: Good eye contact, normal affect. Memory intact not anxious or depressed appearing.  CNS: CN 2-12 intact, power,  normal throughout.no focal deficits noted.   Assessment & Plan  Encounter for pain management The patient's Controlled  Substance registry is reviewed and compliance confirmed. Adequacy of  Pain control and level of function is assessed.Inadequate and regular dosing of meloxicam, ES tylenol  and gabapentin added Medication dosing is adjusted as deemed appropriate. Twelve weeks of medication is prescribed , patient signs for the script and is provided with a follow up appointment between 11 to 12 weeks .   Back pain with right-sided sciatica Uncontrolled.Toradol and depo medrol administered IM in the office , to be followed by a short course of oral prednisone .   Allergic rhinitis Increased and uncontrolled symptoms, start daily steroid nasal spray and tablet  Cervical high risk HPV (human papillomavirus) test positive Repeat pap upcoming  Neck muscle spasm Continue bedtime diazepam  Obesity Improved. Patient re-educated about  the importance of commitment to a  minimum of 150 minutes of exercise per week.  The importance of healthy food choices with portion control discussed. Encouraged to start a food diary, count calories and to consider  joining a support group. Sample diet sheets offered. Goals set by the patient for the next several months.   Weight /BMI 07/29/2017 07/09/2017 07/02/2017  WEIGHT 191 lb 199 lb 12.8 oz 194 lb  HEIGHT 5\' 4"  5\' 4"  5\' 4"   BMI 32.79 kg/m2 34.3 kg/m2 33.3 kg/m2      Breast pain, left Mammogram to be rescheduled by pateint

## 2017-08-17 NOTE — Assessment & Plan Note (Addendum)
The patient's Controlled Substance registry is reviewed and compliance confirmed. Adequacy of  Pain control and level of function is assessed.Inadequate and regular dosing of meloxicam, ES tylenol  and gabapentin added Medication dosing is adjusted as deemed appropriate. Twelve weeks of medication is prescribed , patient signs for the script and is provided with a follow up appointment between 11 to 12 weeks .

## 2017-08-17 NOTE — Assessment & Plan Note (Signed)
Increased and uncontrolled symptoms, start daily steroid nasal spray and tablet

## 2017-08-17 NOTE — Assessment & Plan Note (Signed)
Mammogram to be rescheduled by pateint

## 2017-08-17 NOTE — Assessment & Plan Note (Signed)
Repeat pap upcoming

## 2017-08-20 NOTE — Telephone Encounter (Signed)
noted 

## 2017-08-26 ENCOUNTER — Telehealth: Payer: Self-pay | Admitting: Family Medicine

## 2017-08-26 NOTE — Telephone Encounter (Signed)
Patient left message on nurse line stating that she thinks she has thrush. She thinks it is a result of her flonase, prednisone or a combination of the two. She states her mouth is white and it will not go away with mouthwash or toothbrush.

## 2017-08-27 ENCOUNTER — Other Ambulatory Visit: Payer: Self-pay | Admitting: Family Medicine

## 2017-08-27 MED ORDER — NYSTATIN 100000 UNIT/ML MT SUSP: OROMUCOSAL | 0 refills | Status: DC

## 2017-08-27 NOTE — Telephone Encounter (Signed)
flonase not the cause,may be prednisone, medication is sent for oral thrush

## 2017-08-27 NOTE — Telephone Encounter (Signed)
Left message

## 2017-09-15 ENCOUNTER — Telehealth: Payer: Self-pay | Admitting: Family Medicine

## 2017-09-15 ENCOUNTER — Other Ambulatory Visit: Payer: Self-pay | Admitting: Family Medicine

## 2017-09-15 MED ORDER — DICLOFENAC SODIUM 1 % TD GEL: TRANSDERMAL | 3 refills | Status: DC

## 2017-09-15 NOTE — Telephone Encounter (Signed)
A lot of pain in knees, and in back, started hurting last week, has tried creams, and lotions, --Is there any cream that she can get that does not stink   diclosenac topical gel, 1%, uses this cream that works, when she goes to work.

## 2017-09-15 NOTE — Telephone Encounter (Signed)
Medication is sent in please let her know 

## 2017-09-15 NOTE — Progress Notes (Signed)
diclofenac

## 2017-09-15 NOTE — Telephone Encounter (Signed)
Pt is aware.  

## 2017-09-18 ENCOUNTER — Telehealth: Payer: Self-pay | Admitting: Family Medicine

## 2017-09-18 NOTE — Telephone Encounter (Signed)
Patient called in requesting inj. For back pain, has a trip coming up. Cb#: [phone]

## 2017-09-19 NOTE — Telephone Encounter (Signed)
Now is requesting injection for back pain before going on a trip. Please advise

## 2017-09-19 NOTE — Telephone Encounter (Signed)
cnnot get that today, I am not in the office

## 2017-09-19 NOTE — Telephone Encounter (Signed)
Left message that it could not be done today and to call on Monday if she has not went out of town yet if the injection is still needed

## 2017-09-22 ENCOUNTER — Telehealth: Payer: Self-pay | Admitting: Family Medicine

## 2017-09-22 NOTE — Telephone Encounter (Signed)
Pt needs a Shot she is hurting really bad. She will wait to hear from you before she picks up that medication

## 2017-09-23 ENCOUNTER — Telehealth: Payer: Self-pay | Admitting: Family Medicine

## 2017-09-23 NOTE — Telephone Encounter (Signed)
Called the PT to advised that they could come in for a shot, and she advised that if she could get off work, she would stop by.

## 2017-09-25 NOTE — Telephone Encounter (Signed)
Patient was notified to come by for shot while dr was in the office but she was unable to get off work

## 2017-09-30 ENCOUNTER — Telehealth: Payer: Self-pay | Admitting: Family Medicine

## 2017-09-30 ENCOUNTER — Ambulatory Visit (INDEPENDENT_AMBULATORY_CARE_PROVIDER_SITE_OTHER): Payer: No Typology Code available for payment source

## 2017-09-30 ENCOUNTER — Encounter: Payer: No Typology Code available for payment source | Admitting: Family Medicine

## 2017-09-30 DIAGNOSIS — M5431 Sciatica, right side: Secondary | ICD-10-CM | POA: Diagnosis not present

## 2017-09-30 MED ORDER — KETOROLAC TROMETHAMINE 60 MG/2ML IM SOLN
60.0000 mg | Freq: Once | INTRAMUSCULAR | Status: AC
  Administered 2017-09-30: 60 mg via INTRAMUSCULAR

## 2017-09-30 MED ORDER — METHYLPREDNISOLONE ACETATE 80 MG/ML IJ SUSP
80.0000 mg | Freq: Once | INTRAMUSCULAR | Status: AC
  Administered 2017-09-30: 80 mg via INTRAMUSCULAR

## 2017-09-30 NOTE — Progress Notes (Signed)
Depo and toradol given per drs order for flare of patients back pain and sciatica from a recent trip she took and did a lot of walking. Rates pain 7/10

## 2017-09-30 NOTE — Telephone Encounter (Signed)
Please administer Toradol 60 mg IM and depo medrol 80 mg iM for uncontrolled back pain in the office today

## 2017-09-30 NOTE — Telephone Encounter (Signed)
Done

## 2017-10-11 IMAGING — MG DIGITAL SCREENING BILATERAL MAMMOGRAM WITH CAD
4 series · 4 of 4 positions shown · non-contrast
Comparison: Previous exam(s).

CLINICAL DATA: Screening.

EXAM:
DIGITAL SCREENING BILATERAL MAMMOGRAM WITH CAD

[L CC]
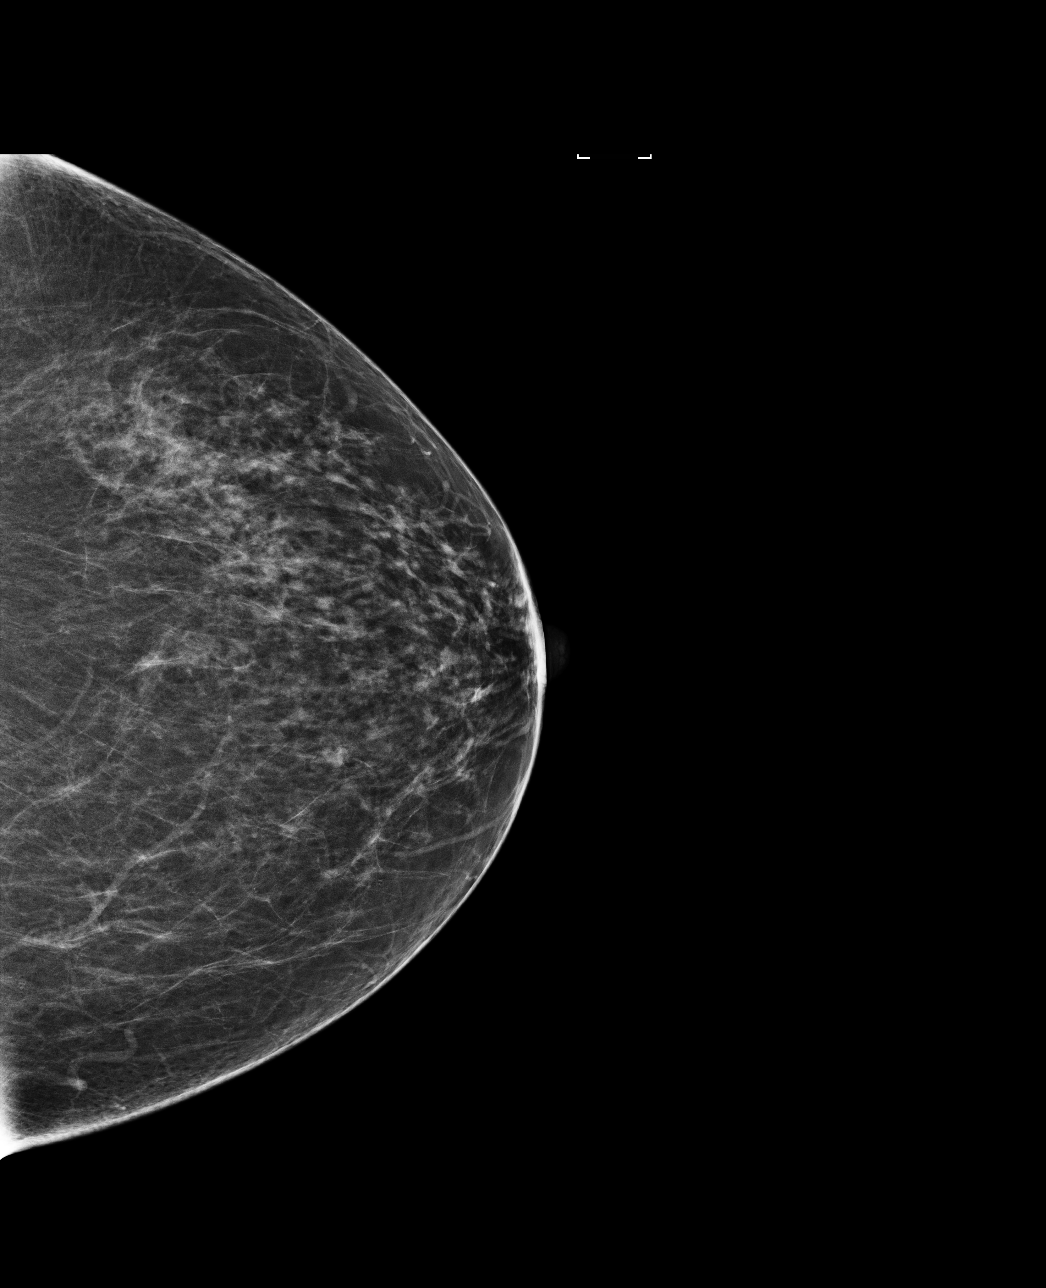

[L MLO]
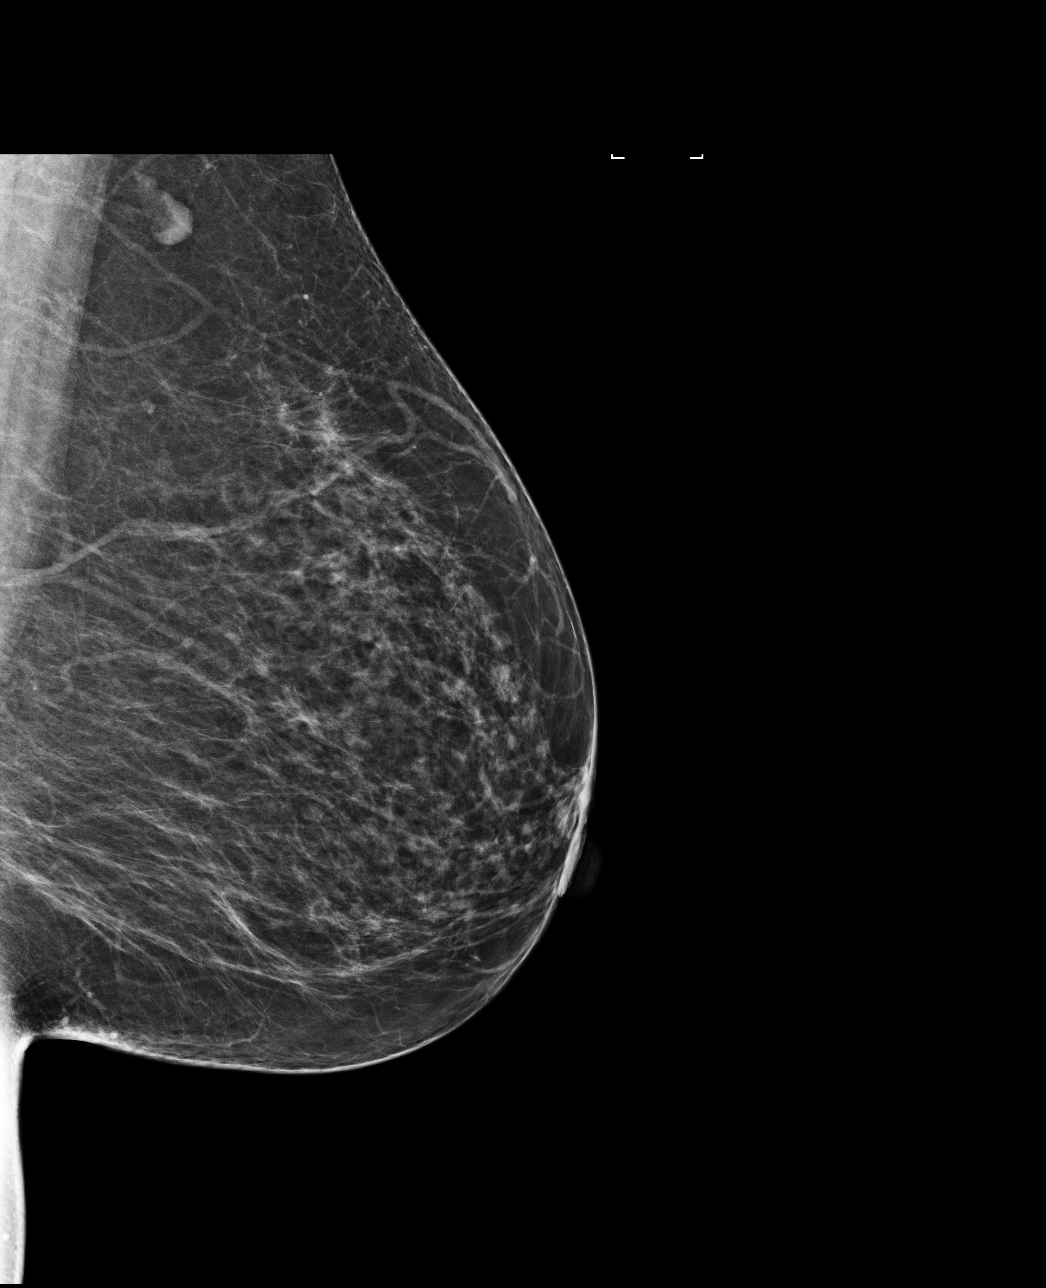

[R CC]
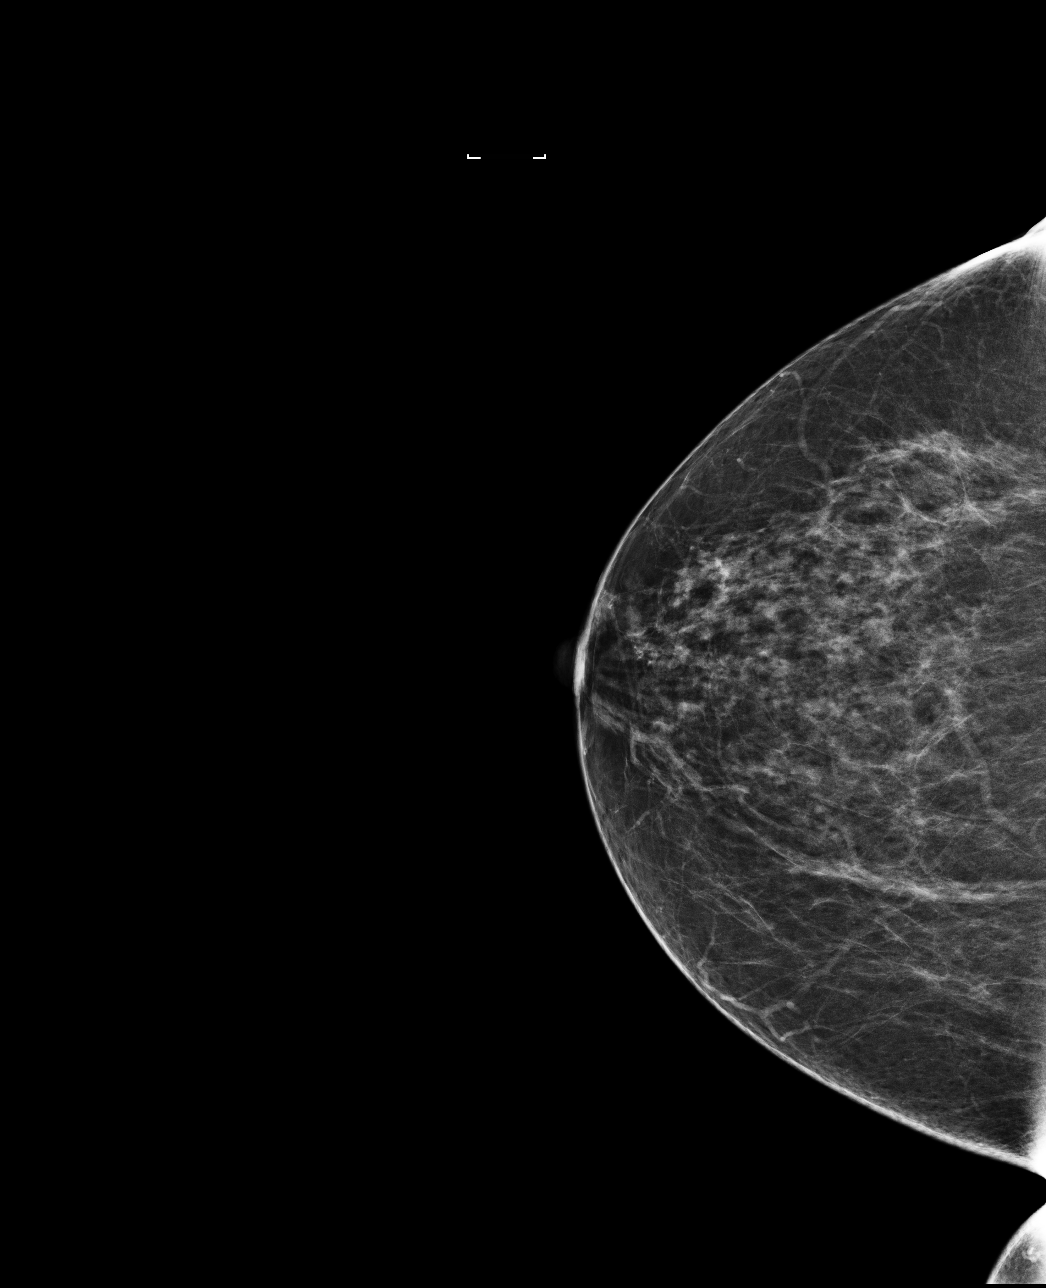

[R MLO]
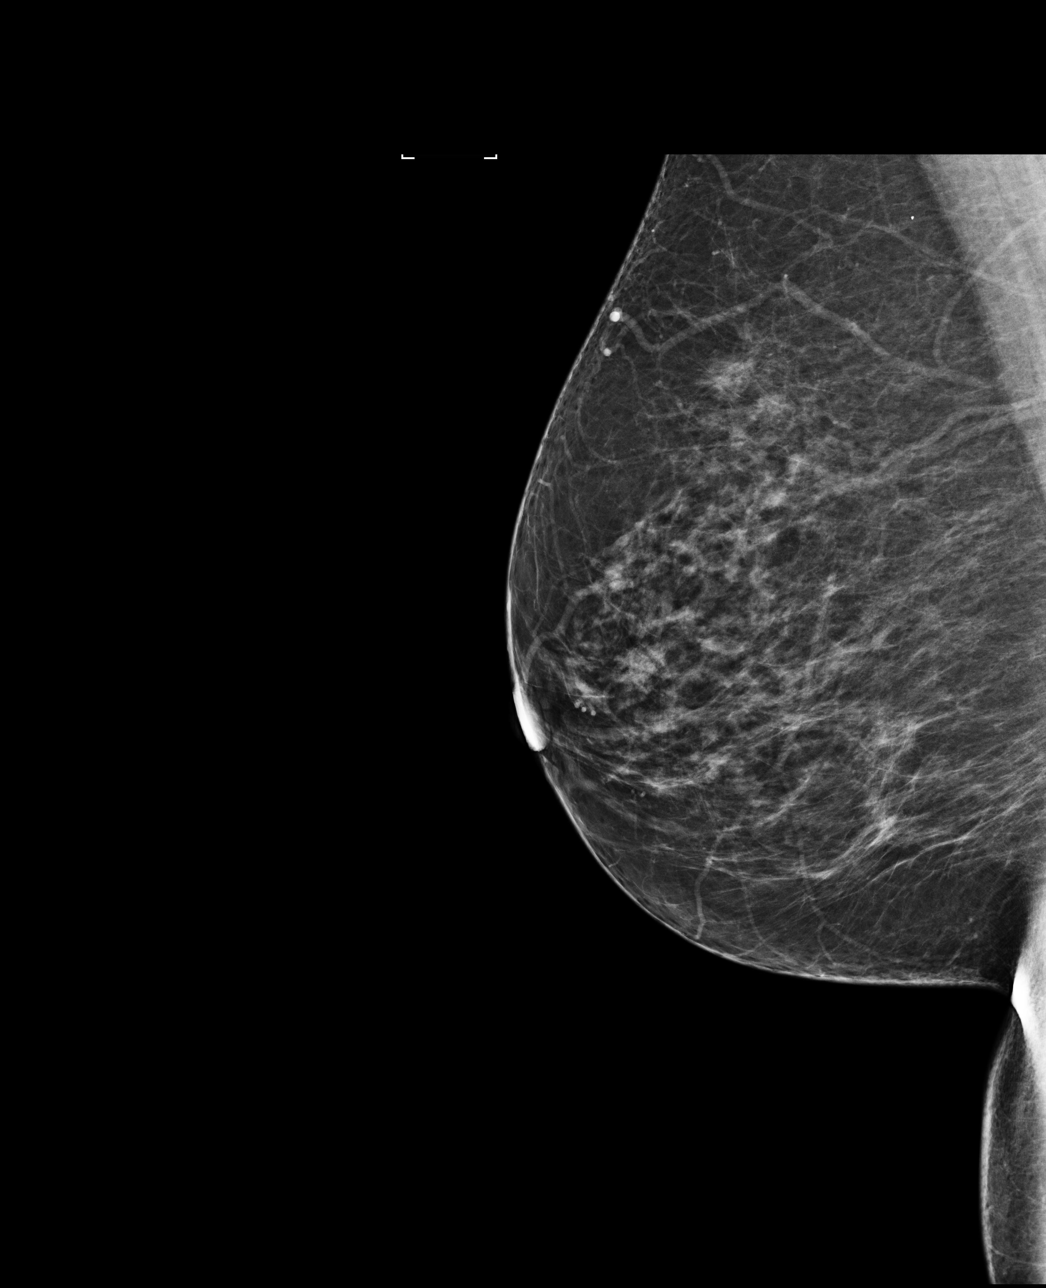

[4 of 4 positions shown; findings below may reference images not displayed]

ACR Breast Density Category b: There are scattered areas of
fibroglandular density.
FINDINGS: There are no findings suspicious for malignancy. Images were
processed with CAD.
IMPRESSION: No mammographic evidence of malignancy. A result letter of this
screening mammogram will be mailed directly to the patient.

RECOMMENDATION:
Screening mammogram in one year. (Code:AS-G-LCT)

BI-RADS CATEGORY  1: Negative.

## 2017-10-14 ENCOUNTER — Encounter: Payer: Self-pay | Admitting: Gastroenterology

## 2017-10-14 ENCOUNTER — Ambulatory Visit: Payer: No Typology Code available for payment source | Admitting: Gastroenterology

## 2017-11-04 ENCOUNTER — Ambulatory Visit (INDEPENDENT_AMBULATORY_CARE_PROVIDER_SITE_OTHER): Payer: No Typology Code available for payment source | Admitting: Family Medicine

## 2017-11-04 ENCOUNTER — Other Ambulatory Visit (HOSPITAL_COMMUNITY)
Admission: RE | Admit: 2017-11-04 | Discharge: 2017-11-04 | Disposition: A | Discharge status: Home / Self Care | Payer: No Typology Code available for payment source | Source: Ambulatory Visit | Attending: Family Medicine | Admitting: Family Medicine

## 2017-11-04 ENCOUNTER — Encounter: Payer: Self-pay | Admitting: Family Medicine

## 2017-11-04 VITALS — BP 120/78 | HR 66 | Temp 99.2°F | Ht 64.0 in | Wt 193.0 lb

## 2017-11-04 DIAGNOSIS — Z0000 Encounter for general adult medical examination without abnormal findings: Secondary | ICD-10-CM

## 2017-11-04 DIAGNOSIS — R87810 Cervical high risk human papillomavirus (HPV) DNA test positive: Secondary | ICD-10-CM

## 2017-11-04 DIAGNOSIS — Z124 Encounter for screening for malignant neoplasm of cervix: Secondary | ICD-10-CM | POA: Insufficient documentation

## 2017-11-04 DIAGNOSIS — Z6838 Body mass index (BMI) 38.0-38.9, adult: Secondary | ICD-10-CM

## 2017-11-04 DIAGNOSIS — M5431 Sciatica, right side: Secondary | ICD-10-CM

## 2017-11-04 DIAGNOSIS — E669 Obesity, unspecified: Secondary | ICD-10-CM

## 2017-11-04 DIAGNOSIS — M25562 Pain in left knee: Secondary | ICD-10-CM

## 2017-11-04 DIAGNOSIS — E559 Vitamin D deficiency, unspecified: Secondary | ICD-10-CM | POA: Diagnosis not present

## 2017-11-04 DIAGNOSIS — M25561 Pain in right knee: Secondary | ICD-10-CM

## 2017-11-04 MED ORDER — KETOROLAC TROMETHAMINE 60 MG/2ML IM SOLN
60.0000 mg | Freq: Once | INTRAMUSCULAR | Status: AC
  Administered 2017-11-04: 60 mg via INTRAMUSCULAR

## 2017-11-04 NOTE — Patient Instructions (Addendum)
F/u in  4.5 months, call if you need  Me sooner  Please get tSH, vit D and fasting  lipid panel and chem 7 a nd EGFR in the next week  Toradol 60 mg IM in the office for back pain and ibuprofen is being sent in  You will be referred to a pain clinic  You are referred to Dr Hal Morales for bilateral knee pain  Please work on good  health habits so that your health will improve. 1. Commitment to daily physical activity for 30 to 60  minutes, if you are able to do this.  2. Commitment to wise food choices. Aim for half of your  food intake to be vegetable and fruit, one quarter starchy foods, and one quarter protein. Try to eat on a regular schedule  3 meals per day, snacking between meals should be limited to vegetables or fruits or small portions of nuts. 64 ounces of water per day is generally recommended, unless you have specific health conditions, like heart failure or kidney failure where you will need to limit fluid intake.  3. Commitment to sufficient and a  good quality of physical and mental rest daily, generally between 6 to 8 hours per day.  WITH PERSISTANCE AND PERSEVERANCE, THE IMPOSSIBLE , BECOMES THE NORM! It is important that you exercise regularly at least 30 minutes 5 times a week. If you develop chest pain, have severe difficulty breathing, or feel very tired, stop exercising immediately and seek medical attention    Thank you  for choosing [facility]. We consider it a privelige to serve you.  Delivering excellent health care in a caring and  compassionate way is our goal.  Partnering with you,  so that together we can achieve this goal is our strategy.

## 2017-11-07 ENCOUNTER — Encounter: Payer: Self-pay | Admitting: Family Medicine

## 2017-11-07 ENCOUNTER — Other Ambulatory Visit: Payer: Self-pay | Admitting: Family Medicine

## 2017-11-07 ENCOUNTER — Telehealth: Payer: Self-pay

## 2017-11-07 DIAGNOSIS — G8929 Other chronic pain: Secondary | ICD-10-CM

## 2017-11-07 DIAGNOSIS — M5442 Lumbago with sciatica, left side: Principal | ICD-10-CM

## 2017-11-07 LAB — CYTOLOGY - PAP
Diagnosis: NEGATIVE
HPV (WINDOPATH): NOT DETECTED

## 2017-11-07 MED ORDER — HYDROCODONE-ACETAMINOPHEN 7.5-325 MG PO TABS: ORAL_TABLET | ORAL | 0 refills | Status: AC

## 2017-11-07 MED ORDER — DIAZEPAM 5 MG PO TABS: ORAL_TABLET | ORAL | 1 refills | Status: DC

## 2017-11-07 NOTE — Telephone Encounter (Signed)
I sent in 3 2 months of hydrocodone and diazepam, she is referred to a pain clinic as we discussed at the visit,  Hopefully she will get an appointment in that time frame, please let her know

## 2017-11-07 NOTE — Assessment & Plan Note (Signed)

## 2017-11-07 NOTE — Assessment & Plan Note (Signed)
Increasingly uncontrolled bilateral knee pain, refer to Dr Hal Morales for re eval and further management

## 2017-11-07 NOTE — Assessment & Plan Note (Signed)
Increasing in severity, uncontrolled on current regime, patient requests referral to pain clinic soonest available, also injection at visit. Toradol 60 mg iM and ibuprofen prescribed . 2 months of vicodin and valium prescribed and referral made

## 2017-11-07 NOTE — Telephone Encounter (Signed)
Patient is out of her pain meds and said they were supposed to be sent in at her visit. Please advise

## 2017-11-07 NOTE — Assessment & Plan Note (Signed)
Deteriorated. Patient re-educated about  the importance of commitment to a  minimum of 150 minutes of exercise per week.  The importance of healthy food choices with portion control discussed. Encouraged to start a food diary, count calories and to consider  joining a support group. Sample diet sheets offered. Goals set by the patient for the next several months.   Weight /BMI 11/04/2017 07/29/2017 07/09/2017  WEIGHT 193 lb 191 lb 199 lb 12.8 oz  HEIGHT 5\' 4"  5\' 4"  5\' 4"   BMI 33.13 kg/m2 32.79 kg/m2 34.3 kg/m2

## 2017-11-07 NOTE — Progress Notes (Signed)
    Anna Cantu     MRN: [id number]      DOB: 03/29/1966  HPI: Patient is in for annual physical exam. C/o uncontrolled back pain, wants to be seen at a pain center as her pain is worsening, feels as though her back is opening up, no relief from the current regime. Requests pain injection at the visit today C/o bilateral knee pain and requests referral to orthopedic Doc she has seen in the past for injections in the knees Labs are past due  Immunization is reviewed , and  updated if needed.   PE: BP 120/78 (BP Location: Left Arm, Patient Position: Sitting, Cuff Size: Large)   Pulse 66   Temp 99.2 F (37.3 C)   Ht 5\' 4"  (1.626 m)   Wt 193 lb (87.5 kg)   LMP 07/01/2013   SpO2 97%   BMI 33.13 kg/m   Pleasant  female, alert and oriented x 3, in no cardio-pulmonary distress.Patient in pain Afebrile. HEENT No facial trauma or asymetry. Sinuses non tender.  Extra occullar muscles intact, pupils equally reactive to light. External ears normal, tympanic membranes clear. Oropharynx moist, no exudate. Neck: supple, no adenopathy,JVD or thyromegaly.No bruits.  Chest: Clear to ascultation bilaterally.No crackles or wheezes. Non tender to palpation  Breast: No asymetry,no masses or lumps. No tenderness. No nipple discharge or inversion. No axillary or supraclavicular adenopathy  Cardiovascular system; Heart sounds normal,  S1 and  S2 ,no S3.  No murmur, or thrill. Apical beat not displaced Peripheral pulses normal.  Abdomen: Soft, non tender, no organomegaly or masses. No bruits. Bowel sounds normal. No guarding, tenderness or rebound.   GU: External genitalia normal female genitalia , normal female distribution of hair. No lesions. Urethral meatus normal in size, no  Prolapse, no lesions visibly  Present. Bladder non tender. Vagina pink and moist , with no visible lesions , discharge present . Adequate pelvic support no  cystocele or rectocele noted Cervix pink  and appears healthy, no lesions or ulcerations noted, no discharge noted from os Uterus normal size, no adnexal masses, no cervical motion or adnexal tenderness.   Musculoskeletal exam: Decreased  ROM of lumbar  spine,  and knees.Normal in shoulders and hips No deformity ,swelling or crepitus noted. No muscle wasting or atrophy.   Neurologic: Cranial nerves 2 to 12 intact. Power, tone ,sensation and reflexes normal throughout. No disturbance in gait. No tremor.  Skin: Intact, no ulceration, erythema , scaling or rash noted. Pigmentation normal throughout  Psych; Normal mood and affect. Judgement and concentration normal   Assessment & Plan:  No problem-specific Assessment & Plan notes found for this encounter.

## 2017-11-07 NOTE — Assessment & Plan Note (Signed)
Pap sent ?

## 2017-11-11 NOTE — Telephone Encounter (Signed)
Spoke with patient and let her know pain medication and diazepam have been sent in for her and referral has been placed for the pain clinic. Gave results of recent pap all with verbal understanding.

## 2017-11-12 ENCOUNTER — Telehealth: Payer: Self-pay | Admitting: Family Medicine

## 2017-11-12 NOTE — Telephone Encounter (Signed)
Anna Cantu w/ [facility] left voicemail requesting insurance information for injection approval. I faxed insurance information, no card on file.

## 2017-12-04 ENCOUNTER — Telehealth: Payer: Self-pay | Admitting: Family Medicine

## 2017-12-04 NOTE — Telephone Encounter (Signed)
DONE

## 2017-12-04 NOTE — Telephone Encounter (Signed)
PT was going in to get lab work, and noticed she was broke out with tiny bumps, would like you to call her.

## 2017-12-04 NOTE — Telephone Encounter (Signed)
pls advise her to use topical OTC hydocortisone cream sparingly to the rash or OTC benadryl cream if worsens will need to go to urgent care or ED, check if any new product whether toiletry or soap

## 2017-12-04 NOTE — Telephone Encounter (Signed)
Please refer her to a dermatologist , I will sign th referrAL

## 2017-12-04 NOTE — Telephone Encounter (Signed)
Patient called in complaining of rash on arms and her face. Been there for 2 weeks.  No itching on face or burning Arms are itching. Cb#: [phone]

## 2017-12-05 LAB — BASIC METABOLIC PANEL WITH GFR
BUN: 8 mg/dL (ref 7–25)
CO2: 27 mmol/L (ref 20–32)
Calcium: 9.5 mg/dL (ref 8.6–10.4)
Chloride: 107 mmol/L (ref 98–110)
Creat: 0.72 mg/dL (ref 0.50–1.05)
GFR, EST AFRICAN AMERICAN: 112 mL/min/{1.73_m2} (ref >=60)
GFR, EST NON AFRICAN AMERICAN: 96 mL/min/{1.73_m2} (ref >=60)
Glucose, Bld: 99 mg/dL (ref 65–139)
Potassium: 3.7 mmol/L (ref 3.5–5.3)
SODIUM: 142 mmol/L (ref 135–146)

## 2017-12-05 LAB — LIPID PANEL
CHOLESTEROL: 192 mg/dL (ref <200)
HDL: 77 mg/dL (ref >50)
LDL CHOLESTEROL (CALC): 95 mg/dL
Non-HDL Cholesterol (Calc): 115 mg/dL (calc) (ref <130)
Total CHOL/HDL Ratio: 2.5 (calc) (ref <5.0)
Triglycerides: 104 mg/dL (ref <150)

## 2017-12-05 LAB — VITAMIN D 25 HYDROXY (VIT D DEFICIENCY, FRACTURES): Vit D, 25-Hydroxy: 25 ng/mL — ABNORMAL LOW (ref 30–100)

## 2017-12-05 LAB — TSH: TSH: 0.35 m[IU]/L — AB

## 2017-12-07 NOTE — Telephone Encounter (Signed)
Patient is referred to dermatology re rash

## 2017-12-09 ENCOUNTER — Telehealth: Payer: Self-pay | Admitting: Family Medicine

## 2017-12-09 NOTE — Telephone Encounter (Signed)
Can pt have a refill on meds  Forwarding to Dr.Simpson

## 2017-12-09 NOTE — Telephone Encounter (Signed)
This patinet needs a scheduled office visit to have her medication

## 2017-12-09 NOTE — Telephone Encounter (Signed)
Patient is requesting a refill for Vicodin She is requesting a 3 month supply, she will not have insurance til mid oct. Due to starting a new job. Cb#: [phone] Her next appt is 03/23/18.

## 2017-12-10 NOTE — Telephone Encounter (Signed)
At June appt, 2 months of pain med prescribed and shwe was referred to a pain cllinic as she requested suince pain was not well controlled, pls print her recent narc history

## 2017-12-10 NOTE — Telephone Encounter (Signed)
Patient states she was just here 11/04/17, wants to know if she has to schedule another appt because she just started another job & has no insurance til mid October.

## 2017-12-11 ENCOUNTER — Other Ambulatory Visit: Payer: Self-pay | Admitting: Family Medicine

## 2017-12-11 ENCOUNTER — Encounter: Payer: Self-pay | Admitting: Family Medicine

## 2017-12-11 NOTE — Telephone Encounter (Signed)
narc history printed and given to Kincaid for review.

## 2017-12-12 ENCOUNTER — Encounter: Payer: Self-pay | Admitting: Family Medicine

## 2017-12-12 ENCOUNTER — Other Ambulatory Visit: Payer: Self-pay | Admitting: Family Medicine

## 2017-12-12 MED ORDER — DIAZEPAM 5 MG PO TABS: ORAL_TABLET | ORAL | 2 refills | Status: DC

## 2017-12-12 MED ORDER — HYDROCODONE-ACETAMINOPHEN 7.5-325 MG PO TABS: ORAL_TABLET | ORAL | 0 refills | Status: AC

## 2017-12-14 NOTE — Telephone Encounter (Signed)
Already addressed

## 2018-01-19 ENCOUNTER — Encounter: Payer: Self-pay | Admitting: Family Medicine

## 2018-01-19 ENCOUNTER — Other Ambulatory Visit: Payer: Self-pay | Admitting: Family Medicine

## 2018-01-27 ENCOUNTER — Ambulatory Visit (INDEPENDENT_AMBULATORY_CARE_PROVIDER_SITE_OTHER): Payer: No Typology Code available for payment source | Admitting: Family Medicine

## 2018-01-27 ENCOUNTER — Encounter: Payer: Self-pay | Admitting: Family Medicine

## 2018-01-27 VITALS — BP 112/78 | HR 62 | Resp 16 | Ht 64.0 in | Wt 187.0 lb

## 2018-01-27 DIAGNOSIS — R52 Pain, unspecified: Secondary | ICD-10-CM

## 2018-01-27 DIAGNOSIS — Z6833 Body mass index (BMI) 33.0-33.9, adult: Secondary | ICD-10-CM

## 2018-01-27 DIAGNOSIS — E6609 Other obesity due to excess calories: Secondary | ICD-10-CM | POA: Diagnosis not present

## 2018-01-27 DIAGNOSIS — M5431 Sciatica, right side: Secondary | ICD-10-CM

## 2018-01-27 DIAGNOSIS — M25562 Pain in left knee: Secondary | ICD-10-CM

## 2018-01-27 DIAGNOSIS — M25561 Pain in right knee: Secondary | ICD-10-CM

## 2018-01-27 DIAGNOSIS — E66811 Obesity, class 1: Secondary | ICD-10-CM

## 2018-01-27 MED ORDER — FLUCONAZOLE 150 MG PO TABS: ORAL_TABLET | ORAL | 0 refills | Status: DC

## 2018-01-27 MED ORDER — PANTOPRAZOLE SODIUM 40 MG PO TBEC: 40.0000 mg | DELAYED_RELEASE_TABLET | Freq: Every day | ORAL | 5 refills | Status: DC

## 2018-01-27 MED ORDER — METHYLPREDNISOLONE ACETATE 80 MG/ML IJ SUSP
80.0000 mg | Freq: Once | INTRAMUSCULAR | Status: AC
  Administered 2018-01-27: 80 mg via INTRAMUSCULAR

## 2018-01-27 MED ORDER — DIAZEPAM 5 MG PO TABS: ORAL_TABLET | ORAL | 5 refills | Status: AC

## 2018-01-27 MED ORDER — KETOROLAC TROMETHAMINE 60 MG/2ML IM SOLN
60.0000 mg | Freq: Once | INTRAMUSCULAR | Status: AC
  Administered 2018-01-27: 60 mg via INTRAMUSCULAR

## 2018-01-27 MED ORDER — IBUPROFEN 800 MG PO TABS: 800.0000 mg | ORAL_TABLET | Freq: Three times a day (TID) | ORAL | 1 refills | Status: DC | PRN

## 2018-01-27 MED ORDER — PREDNISONE 5 MG (21) PO TBPK: 5.0000 mg | ORAL_TABLET | ORAL | 0 refills | Status: DC

## 2018-01-27 NOTE — Progress Notes (Signed)
   Anna Cantu     MRN: [id number]      DOB: 1965/10/31   HPI Anna Cantu is here for chronic pain management and increased and unconrtrolled bilateral knee pain 'Also c/o fine rash on neck and other areasd of her skin since she had excessive sun exposure Had planne d to go to a pain center , however states pain management is adequate and she will just get injections in the knees from ortho when able ROS Denies recent fever or chills. Denies sinus pressure, nasal congestion, ear pain or sore throat. Denies chest congestion, productive cough or wheezing. Denies chest pains, palpitations and leg swelling Denies abdominal pain, nausea, vomiting,diarrhea or constipation.   Denies dysuria, frequency, hesitancy or incontinence. Denies headaches, seizures, numbness, or tingling. Denies depression, anxiety or insomnia.   PE  BP 112/78   Pulse 62   Resp 16   Ht 5\' 4"  (1.626 m)   Wt 187 lb (84.8 kg)   LMP 07/01/2013   SpO2 100%   BMI 32.10 kg/m   Patient alert and oriented and in no cardiopulmonary distress.  HEENT: No facial asymmetry, EOMI,   oropharynx pink and moist.  Neck supple no JVD, no mass.  Chest: Clear to auscultation bilaterally.  CVS: S1, S2 no murmurs, no S3.Regular rate.  ABD: Soft non tender.   Ext: No edema  MS: Adequate though decreased  ROM lumbar  spine,  and knees.Adequate in shoulders and hips  Skin: Intact, Psych: Good eye contact, normal affect. Memory intact not anxious or depressed appearing.  CNS: CN 2-12 intact, power,  normal throughout.no focal deficits noted.   Assessment & Plan  Encounter for pain management The patient's Controlled Substance registry is reviewed and compliance confirmed. Adequacy of  Pain control and level of function is assessed. Medication dosing is adjusted as deemed appropriate. Twelve weeks of medication is prescribed , patient signs for the script and is provided with a follow up appointment between 11  to 12 weeks .   Arthralgia of both knees Increased and uncontrolled pain , will get intra articular injection with orthopedics when able, in the interim she is having IM toradol and depo medrol today  Back pain with right-sided sciatica Patient's controlled substance registry is reviewed, no scripts needed before next visit, diazepam at bedtime prescribed   Obesity Improved Patient re-educated about  the importance of commitment to a  minimum of 150 minutes of exercise per week.  The importance of healthy food choices with portion control discussed. Encouraged to start a food diary, count calories and to consider  joining a support group. Sample diet sheets offered. Goals set by the patient for the next several months.   Weight /BMI 01/27/2018 11/04/2017 07/29/2017  WEIGHT 187 lb 193 lb 191 lb  HEIGHT 5\' 4"  5\' 4"  5\' 4"   BMI 32.1 kg/m2 33.13 kg/m2 32.79 kg/m2

## 2018-01-27 NOTE — Assessment & Plan Note (Addendum)
Increased and uncontrolled pain , will get intra articular injection with orthopedics when able, in the interim she is having IM toradol and depo medrol today

## 2018-01-27 NOTE — Assessment & Plan Note (Signed)
Improved Patient re-educated about  the importance of commitment to a  minimum of 150 minutes of exercise per week.  The importance of healthy food choices with portion control discussed. Encouraged to start a food diary, count calories and to consider  joining a support group. Sample diet sheets offered. Goals set by the patient for the next several months.   Weight /BMI 01/27/2018 11/04/2017 07/29/2017  WEIGHT 187 lb 193 lb 191 lb  HEIGHT 5\' 4"  5\' 4"  5\' 4"   BMI 32.1 kg/m2 33.13 kg/m2 32.79 kg/m2

## 2018-01-27 NOTE — Assessment & Plan Note (Signed)
The patient's Controlled Substance registry is reviewed and compliance confirmed. Adequacy of  Pain control and level of function is assessed. Medication dosing is adjusted as deemed appropriate. Twelve weeks of medication is prescribed , patient signs for the script and is provided with a follow up appointment between 11 to 12 weeks .  

## 2018-01-27 NOTE — Patient Instructions (Signed)
F/u in 12 weeks, call if you need me before   Toradol and depo medrol  In office  Today  Medication will be sent  As discussed

## 2018-01-27 NOTE — Assessment & Plan Note (Signed)
Patient's controlled substance registry is reviewed, no scripts needed before next visit, diazepam at bedtime prescribed

## 2018-01-27 NOTE — Progress Notes (Signed)
Fill Date ID Written Drug Qty Days Prescriber Rx # Pharmacy Refill Daily Dose * Pymt Type PMP  01/19/2018  1  12/12/2017  Diazepam 5 Mg Tablet  30.00 30 Ma Sim  [id number]  [id number]  2/2 0.50 LME Private Pay  [place]  01/19/2018  1  12/12/2017  Hydrocodone-Acetamin 7.5-325  30.00 30 Ma Sim  [id number]  [id number]  1/1 7.50 MME Private Pay  [place]  12/12/2017  1  12/12/2017  Diazepam 5 Mg Tablet  30.00 30 Ma Sim  [id number]  [id number]  1/2 0.50 LME Private Pay  [place]  12/12/2017  1  12/12/2017  Hydrocodone-Acetamin 7.5-325  30.00 30 Ma Sim  [id number]  [id number])  1/1 7.50 MME Private Pay  [place]  11/10/2017  1  11/07/2017  Diazepam 5 Mg Tablet  30.00 30 Ma Sim  [id number]  [id number])  1/1 0.50 LME Private Pay  [place]  11/07/2017  1  11/07/2017  Hydrocodone-Acetamin 7.5-325  30.00 30 Ma Sim  [id number]  [facility] ([id number])  1/1 7.50 MME Private Pay  [place]  10/04/2017  1  07/29/2017  Diazepam 5 Mg Tablet  30.00 30 Ma Sim  [id number]  [id number])  3/2 0.50 LME Private Pay  [place]  10/04/2017  1  07/29/2017  Hydrocodone-Acetamin 7.5-325  30.00 30 Ma Sim  [id number]  [facility] ([id number])  1/1 7.50 MME Private Pay  [place]  08/31/2017  1  07/29/2017  Diazepam 5 Mg Tablet  30.00 30 Ma Sim  [id number]  [id number])  2/2 0.50 LME Comm Ins  [place]  08/31/2017  1  07/29/2017  Hydrocodone-Acetamin 7.5-325  30.00 30 Ma Sim  [id number]  [facility] ([id number])  1/1 7.50 MME Comm Ins  [place]  07/29/2017  1  07/29/2017  Diazepam 5 Mg Tablet  30.00 30 Ma Sim  [id number]  [id number])  1/2 0.50 LME Comm Ins  [place]  07/29/2017  1  07/29/2017  Hydrocodone-Acetamin 7.5-325  30.00 30 Ma Sim  [id number]  [facility] ([id number])  1/1 7.50 MME Comm Ins  [place]  06/25/2017  1  04/03/2017  Hydrocodone-Acetamin 7.5-325  30.00 30 Ma Sim  [id number]  [id number])  1/1 7.50 MME Comm Ins  [place]  06/24/2017  1  03/27/2017  Diazepam 5 Mg Tablet  30.00 30 Ma Sim  [id number]  [id number])  3/2 0.50 LME Comm Ins  [place]  05/29/2017  1  04/03/2017  Hydrocodone-Acetamin 7.5-325  30.00 30 Ma Sim  [id number]  [facility] ([id number])  1/1 7.50 MME Comm Ins  [place]  05/07/2017  1  03/27/2017  Diazepam 5 Mg  Tablet  30.00 30 Ma Sim  [id number]  [id number])  2/2 0.50 LME Comm Ins  [place]  04/28/2017  1  04/03/2017  Hydrocodone-Acetamin 7.5-325  30.00 30 Ma Sim  [id number]  [facility] ([id number])  1/1 7.50 MME Comm Ins  [place]  04/05/2017  1  03/27/2017  Diazepam 5 Mg Tablet  30.00 30 Ma Sim  [id number]  [facility] ([id number])  1/2 0.50 LME Comm Ins  [place]  03/24/2017  2  01/14/2017  Hydrocodone-Acetamin 7.5-325  30.00 30 Ma Sim  [id number]  [facility] ([facility])  1/1 7.50 MME Comm Ins  [place]  02/20/2017  1  09/12/2016  Diazepam 5 Mg Tablet  30.00 30 Ma Sim  [id number]  [facility] ([id number])  1/1 0.50 LME Private Pay    02/20/2017  1  01/14/2017  Hydrocodone-Acetamin 7.5-325  30.00 30 Ma Sim  [id number]  [facility] ([id number])  1/1  7.50 MME Private Pay  [place]  01/15/2017  1  01/14/2017  Diazepam 5 Mg Tablet  30.00 30 Ma Sim  [id number]  [id number])  1/1 0.50 LME Private Pay  [place]  01/14/2017  1  01/14/2017  Hydrocodone-Acetamin 7.5-325  30.00 30 Ma Sim  [id number]  [id number]  1/1 7.50 MME Private Pay  [place]  12/11/2016  1  12/11/2016  Diazepam 5 Mg Tablet  30.00 30

## 2018-03-23 ENCOUNTER — Ambulatory Visit: Payer: No Typology Code available for payment source | Admitting: Family Medicine

## 2018-03-25 ENCOUNTER — Telehealth: Payer: Self-pay

## 2018-03-25 NOTE — Telephone Encounter (Signed)
Prior authorization for patient's Hydrocodone was approved on 03-24-2018 and good through 03-24-2019.

## 2018-04-01 ENCOUNTER — Telehealth: Payer: Self-pay | Admitting: Family Medicine

## 2018-04-01 NOTE — Telephone Encounter (Signed)
She just had both injections at her last visit 9/3. I told Gwinda Passe no more nurse visits for pain injections but wanted to check with you before we scheduled her an appt to see if it was too early to even get the injections again.

## 2018-04-01 NOTE — Telephone Encounter (Signed)
Per Dr Moshe Cipro, its too early for more injections (just got 2 at her last visit)

## 2018-04-01 NOTE — Telephone Encounter (Signed)
Too early , and yes, no more nurse visits

## 2018-04-01 NOTE — Telephone Encounter (Signed)
Pt is calling she wants an Injection in her back, and wants prednisone called in

## 2018-04-01 NOTE — Telephone Encounter (Signed)
Patient is aware 

## 2018-04-07 ENCOUNTER — Encounter: Payer: Self-pay | Admitting: Family Medicine

## 2018-04-13 ENCOUNTER — Ambulatory Visit: Payer: No Typology Code available for payment source | Admitting: Family Medicine

## 2018-04-26 ENCOUNTER — Encounter: Payer: Self-pay | Admitting: Family Medicine

## 2018-04-27 ENCOUNTER — Other Ambulatory Visit: Payer: Self-pay | Admitting: Family Medicine

## 2018-04-27 ENCOUNTER — Telehealth: Payer: Self-pay | Admitting: *Deleted

## 2018-04-27 ENCOUNTER — Telehealth: Payer: Self-pay | Admitting: Family Medicine

## 2018-04-27 MED ORDER — DIAZEPAM 5 MG PO TABS: ORAL_TABLET | ORAL | 2 refills | Status: DC

## 2018-04-27 MED ORDER — HYDROCODONE-ACETAMINOPHEN 7.5-325 MG PO TABS: ORAL_TABLET | ORAL | 0 refills | Status: AC

## 2018-04-27 NOTE — Telephone Encounter (Signed)
03/23/2018 1 01/27/2018 Diazepam 5 Mg Tablet  30.00 30 Ma Sim [id number] [id number] 1/5 0.50 LME Comm Ins [place] 03/23/2018 1 11/07/2017 Hydrocodone-Acetamin 7.5-325  30.00 30 Ma Sim [id number] [facility] ([id number] 0/0 7.50 MME Private Pay [place] 02/19/2018 1 01/27/2018 Diazepam 5 Mg Tablet  30.00 30 Ma Sim [id number] [facility] ([id number] 0/5 0.50 LME Private Pay [place] 02/19/2018 1 12/12/2017 Hydrocodone-Acetamin 7.5-325  30.00 30 Ma Sim [id number] [facility] ([id number] 0/0 7.50 MME Private Pay [place] 01/19/2018 1 12/12/2017 Hydrocodone-Acetamin 7.5-325  30.00 30 Ma Sim [id number] [facility] ([facility] 0/0 7.50 MME Private Pay [place] 01/19/2018 1 12/12/2017 Diazepam 5 Mg Tablet  30.00 30 Ma Sim [id number] [facility] ([facility] 1/2 0.50 LME Private Pay [place] 12/12/2017 1 12/12/2017 Hydrocodone-Acetamin 7.5-325  30.00 30 Ma Sim [id number] [facility] ([id number]) 0/0 7.50 MME Private Pay [place] 12/12/2017 1 12/12/2017 Diazepam 5 Mg Tablet  30.00 30 Ma Sim [id number] [facility] ([id number] 0/2 0.50 LME Private Pay [place] 11/10/2017 1 11/07/2017 Diazepam 5 Mg Tablet  30.00 30 Ma Sim [id number] [facility] ([id number] 0/1 0.50 LME Private Pay [place] 11/07/2017 1 11/07/2017 Hydrocodone-Acetamin 7.5-325  30.00 30 Ma Sim [id number] [facility] ([id number]) 0/0 7.50 MME Private Pay [place] 10/04/2017 1 07/29/2017 Diazepam 5 Mg Tablet  30.00 30 Ma Sim [id number] [facility] ([facility]) 2/2 0.50 LME Private Pay [place] 10/04/2017 1 07/29/2017 Hydrocodone-Acetamin 7.5-325  30.00 30 Ma Sim [id number] [facility] ([id number]) 0/0 7.50 MME Private Pay [place] 08/31/2017 1 07/29/2017 Diazepam 5 Mg Tablet  30.00 30 Ma Sim [id number] [facility] ([id number]) 1/2 0.50 LME Comm Ins [place] 08/31/2017 1 07/29/2017 Hydrocodone-Acetamin 7.5-325  30.00 30 Ma Sim [id number] [facility] ([id number]) 0/0 7.50 MME Comm Ins  07/29/2017 1 07/29/2017 Diazepam 5 Mg Tablet  30.00 30 Ma Sim [id number] [facility] ([id number]) 0/2 0.50 LME Comm Ins  07/29/2017 1 07/29/2017 Hydrocodone-Acetamin 7.5-325  30.00 30 Ma Sim [id number] [facility] ([id number]) 0/0 7.50 MME Comm Ins 

## 2018-04-27 NOTE — Telephone Encounter (Signed)
Noted will send in 1 month only of hydrocodone, she needs oV or to go to pain clinic, will follow through with a message to pt

## 2018-04-27 NOTE — Progress Notes (Signed)
Hydrocodone 7.5

## 2018-04-27 NOTE — Telephone Encounter (Signed)
pls import patient's controlled substance registry, thanks

## 2018-04-27 NOTE — Telephone Encounter (Signed)
PT stated she was supposed to get a 3 month supply of hydrocortisone cream. She only got one now she is almost out. Was requesting a refill on the cream. Can be called back at [phone]

## 2018-04-28 NOTE — Telephone Encounter (Signed)
Patient was actually looking for hydrocodone. She called the pharmacy last week when she was here for her appt, however at that time it was too early for the prescription to be filled. The prescription was sent in yesterday. Advised patient to call pharmacy and check because script was sent in.

## 2018-04-28 NOTE — Telephone Encounter (Signed)
Left message requesting call back. There is no hydrocortisone cream active on patient's chart. Trying to get clarification from patient.

## 2018-06-03 ENCOUNTER — Ambulatory Visit (INDEPENDENT_AMBULATORY_CARE_PROVIDER_SITE_OTHER): Payer: No Typology Code available for payment source | Admitting: Family Medicine

## 2018-06-03 ENCOUNTER — Encounter: Payer: Self-pay | Admitting: Family Medicine

## 2018-06-03 VITALS — BP 108/62 | HR 74 | Resp 14 | Ht 64.0 in | Wt 185.1 lb

## 2018-06-03 DIAGNOSIS — J0101 Acute recurrent maxillary sinusitis: Secondary | ICD-10-CM

## 2018-06-03 DIAGNOSIS — R946 Abnormal results of thyroid function studies: Secondary | ICD-10-CM

## 2018-06-03 DIAGNOSIS — R52 Pain, unspecified: Secondary | ICD-10-CM | POA: Diagnosis not present

## 2018-06-03 DIAGNOSIS — M5431 Sciatica, right side: Secondary | ICD-10-CM | POA: Diagnosis not present

## 2018-06-03 DIAGNOSIS — J309 Allergic rhinitis, unspecified: Secondary | ICD-10-CM

## 2018-06-03 MED ORDER — METHYLPREDNISOLONE ACETATE 80 MG/ML IJ SUSP
80.0000 mg | Freq: Once | INTRAMUSCULAR | Status: AC
  Administered 2018-06-03: 80 mg via INTRAMUSCULAR

## 2018-06-03 MED ORDER — FLUCONAZOLE 150 MG PO TABS: 150.0000 mg | ORAL_TABLET | Freq: Once | ORAL | 0 refills | Status: AC

## 2018-06-03 MED ORDER — DIAZEPAM 5 MG PO TABS: ORAL_TABLET | ORAL | 2 refills | Status: DC

## 2018-06-03 MED ORDER — AZITHROMYCIN 250 MG PO TABS: ORAL_TABLET | ORAL | 0 refills | Status: DC

## 2018-06-03 MED ORDER — KETOROLAC TROMETHAMINE 60 MG/2ML IM SOLN
60.0000 mg | Freq: Once | INTRAMUSCULAR | Status: AC
  Administered 2018-06-03: 60 mg via INTRAMUSCULAR

## 2018-06-03 MED ORDER — HYDROCODONE-ACETAMINOPHEN 7.5-325 MG PO TABS: ORAL_TABLET | ORAL | 0 refills | Status: AC

## 2018-06-03 NOTE — Patient Instructions (Addendum)
F/u in 11 to 12 weeks, call if you need me sooner  Medications are prescribed as discussed, for pain and sinus infection  Change food choice and eating habit so hope is to lose 5 pounds in next 12 weeks  Pain contract signed at visit today,   Toradol and depo medrol administered at visit for uncontrolled pain  Please  get [place] this week, to re evaluate thyroid

## 2018-06-07 ENCOUNTER — Encounter: Payer: Self-pay | Admitting: Family Medicine

## 2018-06-07 NOTE — Assessment & Plan Note (Signed)
The patient's Controlled Substance registry is reviewed and compliance confirmed. Adequacy of  Pain control and level of function is assessed. Medication dosing is adjusted as deemed appropriate. Twelve weeks of medication is prescribed , patient signs for the script and is provided with a follow up appointment between 11 to 12 weeks .  

## 2018-06-07 NOTE — Assessment & Plan Note (Signed)
Uncontrolled , medication prescribed

## 2018-06-07 NOTE — Progress Notes (Signed)
   KARLI WICKIZER     MRN: [id number]      DOB: 1966-05-03   HPI Ms. Jeter is here for follow up and re-evaluation of chronic medical conditions, medication management in particular chronic pain management for back and knee pain, and review of any available recent lab and radiology data.  Preventive health is updated, specifically  Cancer screening and Immunization.   C/o knee pain despite 3 intra articular injections, states unable to afford pain management  4 day h/o sore throat and nasal congestion, and 2 day h/o nausea. No documented fever , has had chills  ROS Denies recent fever or chills. c/o  sinus pressure and nasal congestion, ear pain or sore throat. Denies chest congestion, productive cough or wheezing. Denies chest pains, palpitations and leg swelling Denies abdominal pain, nausea, vomiting,diarrhea or constipation.   Denies dysuria, frequency, hesitancy or incontinence.  Denies headaches, seizures, numbness, or tingling. Denies depression, anxiety or insomnia. Denies skin break down or rash.   PE  BP 108/62 (BP Location: Right Arm, Patient Position: Sitting)   Pulse 74   Resp 14   Ht 5\' 4"  (1.626 m)   Wt 185 lb 1.3 oz (84 kg)   LMP 07/01/2013   SpO2 98% Comment: room air  BMI 31.77 kg/m   Patient alert and oriented and in no cardiopulmonary distress.  HEENT: No facial asymmetry, EOMI,   oropharynx pink and moist.  Neck supple no JVD, no mass.Maxillary sinus tenderness, TM clear bilaterally  Chest: Clear to auscultation bilaterally.  CVS: S1, S2 no murmurs, no S3.Regular rate.  ABD: Soft non tender.   Ext: No edema  MS: Decreased  ROM lumbar spine, adequate in shoulders, hips and reduced in  knees.  Skin: Intact, no ulcerations or rash noted.  Psych: Good eye contact, normal affect. Memory intact not anxious or depressed appearing.  CNS: CN 2-12 intact, power,  normal throughout.no focal deficits noted.   Assessment & Plan  Encounter  for pain management The patient's Controlled Substance registry is reviewed and compliance confirmed. Adequacy of  Pain control and level of function is assessed. Medication dosing is adjusted as deemed appropriate. Twelve weeks of medication is prescribed , patient signs for the script and is provided with a follow up appointment between 11 to 12 weeks .   Allergic rhinitis Uncontrolled , medication prescribed  Back pain with right-sided sciatica Uncontrolled.Toradol and depo medrol administered IM in the office , to be followed by a short course of oral prednisone and NSAIDS.   Maxillary sinusitis, acute Antibiotic course prescribed

## 2018-06-07 NOTE — Assessment & Plan Note (Signed)
Uncontrolled.Toradol and depo medrol administered IM in the office , to be followed by a short course of oral prednisone and NSAIDS.  

## 2018-06-07 NOTE — Assessment & Plan Note (Signed)
Antibiotic course prescribed 

## 2018-06-29 ENCOUNTER — Encounter: Payer: Self-pay | Admitting: Family Medicine

## 2018-06-29 LAB — TSH: TSH: 0.87 mIU/L

## 2018-06-30 ENCOUNTER — Encounter: Payer: Self-pay | Admitting: Family Medicine

## 2018-06-30 ENCOUNTER — Telehealth: Payer: Self-pay | Admitting: Family Medicine

## 2018-06-30 ENCOUNTER — Other Ambulatory Visit: Payer: Self-pay | Admitting: Family Medicine

## 2018-06-30 MED ORDER — DIAZEPAM 5 MG PO TABS: ORAL_TABLET | ORAL | 1 refills | Status: DC

## 2018-06-30 MED ORDER — HYDROCODONE-ACETAMINOPHEN 7.5-325 MG PO TABS: ORAL_TABLET | ORAL | 0 refills | Status: AC

## 2018-06-30 NOTE — Telephone Encounter (Signed)
Please advise 

## 2018-06-30 NOTE — Telephone Encounter (Signed)
Medication sent, she also needs appt scheduled , I sent her a message

## 2018-06-30 NOTE — Telephone Encounter (Signed)
Pt is calling in need of Valium and [place]

## 2018-07-06 NOTE — Telephone Encounter (Signed)
Scheduled 4/27

## 2018-07-29 ENCOUNTER — Telehealth: Payer: Self-pay | Admitting: Family Medicine

## 2018-07-29 ENCOUNTER — Other Ambulatory Visit: Payer: Self-pay | Admitting: Family Medicine

## 2018-07-29 MED ORDER — ALPRAZOLAM 0.25 MG PO TABS: 0.2500 mg | ORAL_TABLET | Freq: Two times a day (BID) | ORAL | 0 refills | Status: DC | PRN

## 2018-07-29 NOTE — Telephone Encounter (Signed)
pls let her know that I am very sorry to hear this, I have sent in 1 weeek o ONLY of low dose xanax

## 2018-07-29 NOTE — Telephone Encounter (Signed)
Pt aware.

## 2018-07-29 NOTE — Progress Notes (Signed)
Klonopin 
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
## 2018-08-08 ENCOUNTER — Encounter: Payer: Self-pay | Admitting: Family Medicine

## 2018-08-10 ENCOUNTER — Telehealth: Payer: Self-pay | Admitting: Family Medicine

## 2018-08-10 ENCOUNTER — Other Ambulatory Visit: Payer: Self-pay | Admitting: Family Medicine

## 2018-08-10 MED ORDER — DIAZEPAM 5 MG PO TABS: ORAL_TABLET | ORAL | 0 refills | Status: DC

## 2018-08-10 MED ORDER — HYDROCODONE-ACETAMINOPHEN 7.5-325 MG PO TABS: ORAL_TABLET | ORAL | 0 refills | Status: DC

## 2018-08-10 NOTE — Telephone Encounter (Signed)
Please change her appt to the week of April 10, for pain management .she will NOT get any pain medication refills if she does not make and keep that appointment , I have sent her a message to that effect  Questions, please ask Thank you!

## 2018-09-03 ENCOUNTER — Other Ambulatory Visit: Payer: Self-pay

## 2018-09-03 ENCOUNTER — Ambulatory Visit (INDEPENDENT_AMBULATORY_CARE_PROVIDER_SITE_OTHER): Payer: No Typology Code available for payment source | Admitting: Family Medicine

## 2018-09-03 ENCOUNTER — Encounter: Payer: Self-pay | Admitting: Family Medicine

## 2018-09-03 VITALS — BP 110/70 | Ht 64.0 in | Wt 185.0 lb

## 2018-09-03 DIAGNOSIS — E669 Obesity, unspecified: Secondary | ICD-10-CM

## 2018-09-03 DIAGNOSIS — J309 Allergic rhinitis, unspecified: Secondary | ICD-10-CM

## 2018-09-03 DIAGNOSIS — Z1231 Encounter for screening mammogram for malignant neoplasm of breast: Secondary | ICD-10-CM

## 2018-09-03 DIAGNOSIS — E66811 Obesity, class 1: Secondary | ICD-10-CM

## 2018-09-03 DIAGNOSIS — R52 Pain, unspecified: Secondary | ICD-10-CM

## 2018-09-03 DIAGNOSIS — M25561 Pain in right knee: Secondary | ICD-10-CM | POA: Diagnosis not present

## 2018-09-03 DIAGNOSIS — M25562 Pain in left knee: Secondary | ICD-10-CM

## 2018-09-03 MED ORDER — HYDROCODONE-ACETAMINOPHEN 7.5-325 MG PO TABS: ORAL_TABLET | ORAL | 0 refills | Status: AC

## 2018-09-03 MED ORDER — DIAZEPAM 5 MG PO TABS: ORAL_TABLET | ORAL | 3 refills | Status: AC

## 2018-09-03 MED ORDER — MONTELUKAST SODIUM 10 MG PO TABS: 10.0000 mg | ORAL_TABLET | Freq: Every day | ORAL | 3 refills | Status: DC

## 2018-09-03 MED ORDER — IBUPROFEN 800 MG PO TABS: 800.0000 mg | ORAL_TABLET | Freq: Three times a day (TID) | ORAL | 1 refills | Status: DC | PRN

## 2018-09-03 MED ORDER — FLUCONAZOLE 150 MG PO TABS: 150.0000 mg | ORAL_TABLET | Freq: Once | ORAL | 0 refills | Status: AC

## 2018-09-03 MED ORDER — PREDNISONE 5 MG (21) PO TBPK: 5.0000 mg | ORAL_TABLET | ORAL | 0 refills | Status: DC

## 2018-09-03 NOTE — Assessment & Plan Note (Addendum)
The patient's Controlled Substance registry is reviewed and compliance confirmed. Adequacy of  Pain control and level of function is assessed.Needs specifically directed treatment for managementr Medication dosing is adjusted as deemed appropriate. Twelve weeks of medication is prescribed , patient signs for the script and is provided with a follow up appointment between 11 to 12 weeks .

## 2018-09-03 NOTE — Assessment & Plan Note (Signed)
   Patient re-educated about  the importance of commitment to a  minimum of 150 minutes of exercise per week as able. Reports intentionally controlling her food environment with fruit and vegetables, and exercising in her home  The importance of healthy food choices with portion control discussed, as well as eating regularly and within a 12 hour window most days. The need to choose "clean , green" food 50 to 75% of the time is discussed, as well as to make water the primary drink and set a goal of 64 ounces water daily.  Encouraged to start a food diary,  and to consider  joining a support group. Sample diet sheets offered. Goals set by the patient for the next several months.   Weight /BMI 09/03/2018 06/03/2018 01/27/2018  WEIGHT 185 lb 185 lb 1.3 oz 187 lb  HEIGHT 5\' 4"  5\' 4"  5\' 4"   BMI 31.76 kg/m2 31.77 kg/m2 32.1 kg/m2

## 2018-09-03 NOTE — Progress Notes (Signed)
Virtual Visit via Telephone Note  I connected with Anna Cantu on 09/03/18 at  8:20 AM EDT by telephone and verified that I am speaking with the correct person using two identifiers.   I discussed the limitations, risks, security and privacy concerns of performing an evaluation and management service by telephone and the availability of in person appointments. I also discussed with the patient that there may be a patient responsible charge related to this service. The patient expressed understanding and agreed to proceed. Pt is at her home working , and I am in my home, face to face contact though desired is not possible   History of Present Illness: Denies recent fever or chills. C/o increased , nasal congestion,denies ear pain or sore throat. Denies chest congestion, productive cough or wheezing. Denies chest pains, palpitations and leg swelling Denies abdominal pain, nausea, vomiting,diarrhea or constipation.   Denies dysuria, frequency, hesitancy or incontinence. Uncontrolled back and lower extremity pain, left greater than right, reports weakness in left leg, no incontinenece Denies headaches, seizures, numbness, or tingling. Denies depression, anxiety or insomnia. Denies skin break down or rash.       Observations/Objective:  BP 110/70   Ht 5\' 4"  (1.626 m)   Wt 185 lb (83.9 kg)   LMP 07/01/2013   BMI 31.76 kg/m   Assessment and Plan: Encounter for pain management The patient's Controlled Substance registry is reviewed and compliance confirmed. Adequacy of  Pain control and level of function is assessed.Needs specifically directed treatment for managementr Medication dosing is adjusted as deemed appropriate. Twelve weeks of medication is prescribed , patient signs for the script and is provided with a follow up appointment between 11 to 12 weeks .   Arthralgia of both knees Increased and uncontrolled, rated at a 10, prednisone dose pack and fluconazole x 1  prescribed  Allergic rhinitis Increased and uncontrolled with onset of Spring, start daily singulair, use face mask and prednisone dose pack is prescribed  Obesity   Patient re-educated about  the importance of commitment to a  minimum of 150 minutes of exercise per week as able. Reports intentionally controlling her food environment with fruit and vegetables, and exercising in her home  The importance of healthy food choices with portion control discussed, as well as eating regularly and within a 12 hour window most days. The need to choose "clean , green" food 50 to 75% of the time is discussed, as well as to make water the primary drink and set a goal of 64 ounces water daily.  Encouraged to start a food diary,  and to consider  joining a support group. Sample diet sheets offered. Goals set by the patient for the next several months.   Weight /BMI 09/03/2018 06/03/2018 01/27/2018  WEIGHT 185 lb 185 lb 1.3 oz 187 lb  HEIGHT 5\' 4"  5\' 4"  5\' 4"   BMI 31.76 kg/m2 31.77 kg/m2 32.1 kg/m2        Follow Up Instructions:    I discussed the assessment and treatment plan with the patient. The patient was provided an opportunity to ask questions and all were answered. The patient agreed with the plan and demonstrated an understanding of the instructions.   The patient was advised to call back or seek an in-person evaluation if the symptoms worsen or if the condition fails to improve as anticipated.  I provided 25 minutes of non-face-to-face time during this encounter.   [name], MD

## 2018-09-03 NOTE — Assessment & Plan Note (Signed)
Increased and uncontrolled with onset of Spring, start daily singulair, use face mask and prednisone dose pack is prescribed

## 2018-09-03 NOTE — Assessment & Plan Note (Signed)
Increased and uncontrolled, rated at a 10, prednisone dose pack and fluconazole x 1 prescribed

## 2018-09-03 NOTE — Patient Instructions (Addendum)
F/U week of July 6 , call if you need me before  Mammogram appointment is scheduled for the Summer, appt info enclosed  Medication is sent in for chronic allergies and current flare   Pain medication is prescribed as before, also with a short course of prednisone due to current uncontrolled pain  It is important that you exercise regularly at least 30 minutes 5 times a week. If you develop chest pain, have severe difficulty breathing, or feel very tired, stop exercising immediately and seek medical attention    Social distancing. Use a face mask when you are out in the public Frequent hand washing with soap and water Keeping your hands off of your face. These 4 practices will help to keep both you and your community healthy during this time. Please practice them faithfully!  Think about what you will eat, plan ahead. Choose " clean, green, fresh or frozen" over canned, processed or packaged foods which are more sugary, salty and fatty. 70 to 75% of food eaten should be vegetables and fruit. Three meals at set times with snacks allowed between meals, but they must be fruit or vegetables. Aim to eat over a 12 hour period , example 7 am to 7 pm, and STOP after  your last meal of the day. Drink water,generally about 64 ounces per day, no other drink is as healthy. Fruit juice is best enjoyed in a healthy way, by EATING the fruit. Thanks for choosing [facility], we consider it a privelige to serve you.         Back Exercises If you have pain in your back, do these exercises 2-3 times each day or as told by your doctor. When the pain goes away, do the exercises once each day, but repeat the steps more times for each exercise (do more repetitions). If you do not have pain in your back, do these exercises once each day or as told by your doctor. Exercises Single Knee to Chest Do these steps 3-5 times in a row for each leg: 1. Lie on your back on a firm bed or the floor with  your legs stretched out. 2. Bring one knee to your chest. 3. Hold your knee to your chest by grabbing your knee or thigh. 4. Pull on your knee until you feel a gentle stretch in your lower back. 5. Keep doing the stretch for 10-30 seconds. 6. Slowly let go of your leg and straighten it. Pelvic Tilt Do these steps 5-10 times in a row: 1. Lie on your back on a firm bed or the floor with your legs stretched out. 2. Bend your knees so they point up to the ceiling. Your feet should be flat on the floor. 3. Tighten your lower belly (abdomen) muscles to press your lower back against the floor. This will make your tailbone point up to the ceiling instead of pointing down to your feet or the floor. 4. Stay in this position for 5-10 seconds while you gently tighten your muscles and breathe evenly. Cat-Cow Do these steps until your lower back bends more easily: 1. Get on your hands and knees on a firm surface. Keep your hands under your shoulders, and keep your knees under your hips. You may put padding under your knees. 2. Let your head hang down, and make your tailbone point down to the floor so your lower back is round like the back of a cat. 3. Stay in this position for 5 seconds. 4. Slowly  lift your head and make your tailbone point up to the ceiling so your back hangs low (sags) like the back of a cow. 5. Stay in this position for 5 seconds.  Press-Ups Do these steps 5-10 times in a row: 1. Lie on your belly (face-down) on the floor. 2. Place your hands near your head, about shoulder-width apart. 3. While you keep your back relaxed and keep your hips on the floor, slowly straighten your arms to raise the top half of your body and lift your shoulders. Do not use your back muscles. To make yourself more comfortable, you may change where you place your hands. 4. Stay in this position for 5 seconds. 5. Slowly return to lying flat on the floor.  Bridges Do these steps 10 times in a row: 1. Lie on  your back on a firm surface. 2. Bend your knees so they point up to the ceiling. Your feet should be flat on the floor. 3. Tighten your butt muscles and lift your butt off of the floor until your waist is almost as high as your knees. If you do not feel the muscles working in your butt and the back of your thighs, slide your feet 1-2 inches farther away from your butt. 4. Stay in this position for 3-5 seconds. 5. Slowly lower your butt to the floor, and let your butt muscles relax. If this exercise is too easy, try doing it with your arms crossed over your chest. Belly Crunches Do these steps 5-10 times in a row: 1. Lie on your back on a firm bed or the floor with your legs stretched out. 2. Bend your knees so they point up to the ceiling. Your feet should be flat on the floor. 3. Cross your arms over your chest. 4. Tip your chin a little bit toward your chest but do not bend your neck. 5. Tighten your belly muscles and slowly raise your chest just enough to lift your shoulder blades a tiny bit off of the floor. 6. Slowly lower your chest and your head to the floor. Back Lifts Do these steps 5-10 times in a row: 1. Lie on your belly (face-down) with your arms at your sides, and rest your forehead on the floor. 2. Tighten the muscles in your legs and your butt. 3. Slowly lift your chest off of the floor while you keep your hips on the floor. Keep the back of your head in line with the curve in your back. Look at the floor while you do this. 4. Stay in this position for 3-5 seconds. 5. Slowly lower your chest and your face to the floor. Contact a doctor if:  Your back pain gets a lot worse when you do an exercise.  Your back pain does not lessen 2 hours after you exercise. If you have any of these problems, stop doing the exercises. Do not do them again unless your doctor says it is okay. Get help right away if:  You have sudden, very bad back pain. If this happens, stop doing the  exercises. Do not do them again unless your doctor says it is okay. This information is not intended to replace advice given to you by your health care provider. Make sure you discuss any questions you have with your health care provider. Document Released: 06/15/2010 Document Revised: 02/04/2018 Document Reviewed: 07/07/2014 Elsevier Interactive Patient Education  [employer].

## 2018-09-21 ENCOUNTER — Ambulatory Visit: Payer: No Typology Code available for payment source | Admitting: Family Medicine

## 2018-09-22 ENCOUNTER — Encounter: Payer: Self-pay | Admitting: Family Medicine

## 2018-09-22 ENCOUNTER — Ambulatory Visit (INDEPENDENT_AMBULATORY_CARE_PROVIDER_SITE_OTHER): Payer: No Typology Code available for payment source | Admitting: Family Medicine

## 2018-09-22 VITALS — BP 110/70 | Ht 64.0 in | Wt 185.0 lb

## 2018-09-22 DIAGNOSIS — M5431 Sciatica, right side: Secondary | ICD-10-CM

## 2018-09-22 DIAGNOSIS — R062 Wheezing: Secondary | ICD-10-CM

## 2018-09-22 DIAGNOSIS — B9789 Other viral agents as the cause of diseases classified elsewhere: Secondary | ICD-10-CM

## 2018-09-22 DIAGNOSIS — E6609 Other obesity due to excess calories: Secondary | ICD-10-CM | POA: Diagnosis not present

## 2018-09-22 DIAGNOSIS — Z6831 Body mass index (BMI) 31.0-31.9, adult: Secondary | ICD-10-CM

## 2018-09-22 DIAGNOSIS — J309 Allergic rhinitis, unspecified: Secondary | ICD-10-CM

## 2018-09-22 DIAGNOSIS — J069 Acute upper respiratory infection, unspecified: Secondary | ICD-10-CM | POA: Diagnosis not present

## 2018-09-22 DIAGNOSIS — N3000 Acute cystitis without hematuria: Secondary | ICD-10-CM

## 2018-09-22 MED ORDER — ALBUTEROL SULFATE (SENSOR) 108 (90 BASE) MCG/ACT IN AEPB: 2.0000 | INHALATION_SPRAY | Freq: Three times a day (TID) | RESPIRATORY_TRACT | 1 refills | Status: DC

## 2018-09-22 MED ORDER — ACETAMINOPHEN 500 MG PO TABS: 500.0000 mg | ORAL_TABLET | Freq: Four times a day (QID) | ORAL | 0 refills | Status: AC | PRN

## 2018-09-22 MED ORDER — FLUCONAZOLE 150 MG PO TABS: ORAL_TABLET | ORAL | 0 refills | Status: DC

## 2018-09-22 MED ORDER — ONDANSETRON HCL 4 MG PO TABS: 4.0000 mg | ORAL_TABLET | Freq: Three times a day (TID) | ORAL | 0 refills | Status: DC | PRN

## 2018-09-22 MED ORDER — SULFAMETHOXAZOLE-TRIMETHOPRIM 800-160 MG PO TABS: 1.0000 | ORAL_TABLET | Freq: Two times a day (BID) | ORAL | 0 refills | Status: DC

## 2018-09-22 NOTE — Patient Instructions (Addendum)
Keep July appointment already scheduled, call if you need me sooner  You are treated for presumed urinary tract infection 3 days of septra are prescribed (also fluoconazole )  For sinus symptoms and cough  use mucinex as directed, and albuterol inhaler is prescribed for intermittent wheezing   tylenol is prescribed for use for pain and fever  Zofran is prescribed for nausea  Apart from a UTI, your symptoms are most  Consistent with an acute viral infection  Call back if you worsen or seek urgent medical attention please  Thanks for choosing [facility], we consider it a privelige to serve you.

## 2018-09-22 NOTE — Progress Notes (Signed)
Virtual Visit via Video Note  I connected with Anna Cantu on 09/22/18 at  3:00 PM EDT by a video enabled telemedicine application and verified that I am speaking with the correct person using two identifiers.   I discussed the limitations of evaluation and management by telemedicine and the availability of in person appointments. The patient expressed understanding and agreed to proceed. 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 Scant sputum which is clear, no known sick contact C/o intermittent wheezing C/o dysuria and frequency x 3 days, denies flank pain C/o chronic nausea, no vomit or change in BM, associated it with sinus drainage Denies recent fever or chills. Denies chest pains, palpitations and leg swelling  . C/o chronic back and knee pain and muscle spasm Denies headaches, seizures, numbness, or tingling. Denies depression, uncontrolled anxiety or insomnia. Denies skin break down or rash.       Observations/Objective: BP 110/70   Ht 5\' 4"  (1.626 m)   Wt 185 lb (83.9 kg)   LMP 07/01/2013   BMI 31.76 kg/m   Good communication with no confusion and intact memory. Alert and oriented x 3 No signs of respiratory distress during sppech    Assessment and Plan: UTI (urinary tract infection) History c/w  With acute UTI, short antibiotic course prescribed, encouraged pt to push fluids  Viral URI with cough Tylenol alt with ibuprofen for pain and fever. Push fluids Mucinex for symptom relief  Obesity   Patient re-educated about  the importance of commitment to a  minimum of 150 minutes of exercise per week as able.  The importance of healthy food choices with portion control discussed, as well as eating regularly and within a 12 hour window most days. The need to choose "clean  , green" food 50 to 75% of the time is discussed, as well as to make water the primary drink and set a goal of 64 ounces water daily.   Weight /BMI 09/22/2018 09/03/2018 06/03/2018  WEIGHT 185 lb 185 lb 185 lb 1.3 oz  HEIGHT 5\' 4"  5\' 4"  5\' 4"   BMI 31.76 kg/m2 31.76 kg/m2 31.77 kg/m2      Back pain with right-sided sciatica Controlled on current regime , continue same  Wheezing Exacerbated by URI, albuterol prescribed  Allergic rhinitis Re educated re need to use prescription medication daily for symptom control as Spring is a season which exacerbates her symptoms    Follow Up Instructions:    I discussed the assessment and treatment plan with the patient. The patient was provided an opportunity to ask questions and all were answered. The patient agreed with the plan and demonstrated an understanding of the instructions.   The patient was advised to call back or seek an in-person evaluation if the symptoms worsen or if the condition fails to improve as anticipated.  I provided 25 minutes of non-face-to-face time during this encounter.   [name], MD

## 2018-09-26 ENCOUNTER — Encounter: Payer: Self-pay | Admitting: Family Medicine

## 2018-09-26 DIAGNOSIS — R062 Wheezing: Secondary | ICD-10-CM | POA: Insufficient documentation

## 2018-09-26 DIAGNOSIS — J069 Acute upper respiratory infection, unspecified: Secondary | ICD-10-CM | POA: Insufficient documentation

## 2018-09-26 DIAGNOSIS — N390 Urinary tract infection, site not specified: Secondary | ICD-10-CM | POA: Insufficient documentation

## 2018-09-26 DIAGNOSIS — B9789 Other viral agents as the cause of diseases classified elsewhere: Principal | ICD-10-CM

## 2018-09-26 NOTE — Assessment & Plan Note (Signed)
Tylenol alt with ibuprofen for pain and fever. Push fluids Mucinex for symptom relief

## 2018-09-26 NOTE — Assessment & Plan Note (Signed)
Re educated re need to use prescription medication daily for symptom control as Spring is a season which exacerbates her symptoms

## 2018-09-26 NOTE — Assessment & Plan Note (Signed)
Exacerbated by URI, albuterol prescribed

## 2018-09-26 NOTE — Assessment & Plan Note (Signed)
History c/w  With acute UTI, short antibiotic course prescribed, encouraged pt to push fluids

## 2018-09-26 NOTE — Assessment & Plan Note (Signed)
Controlled on current regime , continue same

## 2018-09-26 NOTE — Assessment & Plan Note (Signed)
   Patient re-educated about  the importance of commitment to a  minimum of 150 minutes of exercise per week as able.  The importance of healthy food choices with portion control discussed, as well as eating regularly and within a 12 hour window most days. The need to choose "clean , green" food 50 to 75% of the time is discussed, as well as to make water the primary drink and set a goal of 64 ounces water daily.   Weight /BMI 09/22/2018 09/03/2018 06/03/2018  WEIGHT 185 lb 185 lb 185 lb 1.3 oz  HEIGHT 5\' 4"  5\' 4"  5\' 4"   BMI 31.76 kg/m2 31.76 kg/m2 31.77 kg/m2

## 2018-09-28 ENCOUNTER — Other Ambulatory Visit: Payer: Self-pay | Admitting: Family Medicine

## 2018-11-17 ENCOUNTER — Encounter: Payer: Self-pay | Admitting: Family Medicine

## 2018-11-18 ENCOUNTER — Telehealth: Payer: Self-pay | Admitting: *Deleted

## 2018-11-18 ENCOUNTER — Other Ambulatory Visit: Payer: Self-pay

## 2018-11-18 ENCOUNTER — Telehealth: Payer: No Typology Code available for payment source | Admitting: Family Medicine

## 2018-11-18 ENCOUNTER — Ambulatory Visit (INDEPENDENT_AMBULATORY_CARE_PROVIDER_SITE_OTHER): Payer: No Typology Code available for payment source | Admitting: Family Medicine

## 2018-11-18 ENCOUNTER — Encounter: Payer: Self-pay | Admitting: Family Medicine

## 2018-11-18 VITALS — Ht 64.0 in | Wt 190.0 lb

## 2018-11-18 DIAGNOSIS — R51 Headache: Secondary | ICD-10-CM | POA: Diagnosis not present

## 2018-11-18 DIAGNOSIS — R197 Diarrhea, unspecified: Secondary | ICD-10-CM

## 2018-11-18 DIAGNOSIS — Z20822 Contact with and (suspected) exposure to covid-19: Secondary | ICD-10-CM

## 2018-11-18 DIAGNOSIS — R509 Fever, unspecified: Secondary | ICD-10-CM | POA: Diagnosis not present

## 2018-11-18 DIAGNOSIS — R519 Headache, unspecified: Secondary | ICD-10-CM

## 2018-11-18 NOTE — Progress Notes (Signed)
Virtual Visit via Telephone Note   This visit type was conducted due to national recommendations for restrictions regarding the COVID-19 Pandemic (e.g. social distancing) in an effort to limit this patient's exposure and mitigate transmission in our community.  Due to her co-morbid illnesses, this patient is at least at moderate risk for complications without adequate follow up.  This format is felt to be most appropriate for this patient at this time.  The patient did not have access to video technology/had technical difficulties with video requiring transitioning to audio format only (telephone).  All issues noted in this document were discussed and addressed.  No physical exam could be performed with this format.    Evaluation Performed:  Follow-up visit  Date:  11/18/2018   ID:  Anna Cantu, DOB Mar 06, 1966, MRN [id number]  Patient Location: Home Provider Location: Other:  telemedicine  Location of Patient: Home Location of Provider: Telehealth Consent was obtain for visit to be over via telehealth. I verified that I am speaking with the correct person using two identifiers.  PCP:  Fayrene Helper, MD   Chief Complaint:  sick  History of Present Illness:    Anna Cantu is a 53 y.o. female with diarrhea, fever, nausea, headache. Started on Saturday 11/24/2018. Body aches. Chills also. Cough just started yesterday. Some shortness of breath and trouble talking. Trouble and pain with swallowing.  Has been around two people that tested positive for COVID. Went to uncles funeral and was around several people who said they had  COVID. She is unsure what her Uncle passed away from.   The patient does have symptoms concerning for COVID-19 infection (fever, chills, cough, or new shortness of breath).   Past Medical, Surgical, Social History, Allergies, and Medications have been Reviewed.   Past Medical History:  Diagnosis Date  . Chronic back pain   .  Gastritis   . Hemorrhoids   . Nicotine addiction   . Obesity   . Ovarian cyst    Past Surgical History:  Procedure Laterality Date  . btl and endometrial abaltion    . CHOLECYSTECTOMY  2007   Dr. Aviva Signs   . COLONOSCOPY   08/19/2008   Dr. Madolyn Frieze canal hemorrhoids.  Diminutive rectal polyp  status post cold biopsy and removal.  The remainder of rectal mucosa and colon was normal. Path: polypoid.   Marland Kitchen COLONOSCOPY WITH ESOPHAGOGASTRODUODENOSCOPY (EGD) N/A 07/08/2013   RMR: Rectal and colonic polyps-one tubular adenoma. colonic diverticulosis. Hemorroids-likely source of hematochezia.  NEXT TCS IN 7 YEARS. Small hiatal hernia . Antral erosions;nonbleeding duodenal AVM; status post gastric biopsy with severe H.pylori  . ESOPHAGOGASTRODUODENOSCOPY   08/19/2008   Dr. Volney American esophageal erosions, consistent with erosive reflux esophagitis/small HH/The remainder of her upper GI tract appeared normal  . left knee surgey- arthroscopy June 2011     Dr. Percell Miller   . right carpal tunnel release  1991  . Right Inguinal hernia repair  1982  . right knee surgery  1993  . TUBAL LIGATION       Current Meds  Medication Sig  . acetaminophen (TYLENOL) 500 MG tablet Take 1 tablet (500 mg total) by mouth every 6 (six) hours as needed.  . Ascorbic Acid (VITAMIN C) 1000 MG tablet Take 1,000 mg by mouth daily.  . diazepam (VALIUM) 5 MG tablet Take 1 tablet by mouth at bedtime. For back spasms  . diclofenac sodium (VOLTAREN) 1 % GEL Apply twice daily to affected areas  .  HYDROcodone-acetaminophen (NORCO) 7.5-325 MG tablet Take one tablet two times daily by mouth for back and knee pain  . ibuprofen (ADVIL,MOTRIN) 800 MG tablet Take 1 tablet (800 mg total) by mouth every 8 (eight) hours as needed. Max 3 tabs a week  . montelukast (SINGULAIR) 10 MG tablet Take 1 tablet (10 mg total) by mouth at bedtime.  . Multiple Vitamin (MULITIVITAMIN WITH MINERALS) TABS Take 1 tablet by mouth daily.  . ondansetron  (ZOFRAN) 4 MG tablet Take 1 tablet (4 mg total) by mouth every 8 (eight) hours as needed for nausea or vomiting.  Marland Kitchen PROAIR RESPICLICK 076 (90 Base) MCG/ACT AEPB INHALE 2 PUFFS BY MOUTH EVERY 6 TO 8 HOURS AS NEEDED FOR  WHEEZING  . sulfamethoxazole-trimethoprim (BACTRIM DS) 800-160 MG tablet Take 1 tablet by mouth 2 (two) times daily.  Marland Kitchen UNABLE TO FIND Tumeric 2000mg  daily  . vitamin B-12 (CYANOCOBALAMIN) 500 MCG tablet Take 500 mcg by mouth daily.  . vitamin E 600 UNIT capsule Take 600 Units by mouth daily.  . Zinc 15 MG CAPS Take 1 capsule by mouth daily.     Allergies:   Ciprofloxacin, Flagyl [metronidazole], and Amoxicillin   Social History   Tobacco Use  . Smoking status: Former Smoker    Packs/day: 0.50    Types: Cigarettes  . Smokeless tobacco: Never Used  . Tobacco comment: quit Mar 03, 2013   Substance Use Topics  . Alcohol use: Yes    Comment: occasionally  . Drug use: No     Family Hx: The patient's family history includes Breast cancer in her paternal grandmother; Hypertension in her father and mother; Stroke in her mother. There is no history of Colon cancer.  ROS:   Please see the history of present illness.    All other systems reviewed and are negative.   Labs/Other Tests and Data Reviewed:     Recent Labs: 12/04/2017: BUN 8; Creat 0.72; Potassium 3.7; Sodium 142 06/29/2018: TSH 0.87   Recent Lipid Panel Lab Results  Component Value Date/Time   CHOL 192 12/04/2017 11:37 AM   TRIG 104 12/04/2017 11:37 AM   HDL 77 12/04/2017 11:37 AM   CHOLHDL 2.5 12/04/2017 11:37 AM   LDLCALC 95 12/04/2017 11:37 AM    Wt Readings from Last 3 Encounters:  11/18/18 190 lb (86.2 kg)  09/22/18 185 lb (83.9 kg)  09/03/18 185 lb (83.9 kg)     Objective:    Vital Signs:  Ht 5\' 4"  (1.626 m)   Wt 190 lb (86.2 kg)   LMP 07/01/2013   BMI 32.61 kg/m    GEN:  alert and oriented RESPIRATORY:  no shortness of breath noted in conversation PSYCH:  normal mood, affect, and  good communication  ASSESSMENT & PLAN:    1. Diarrhea, unspecified type Has been exposed to [place].  Around several people who are positive.  Will need testing to make sure that she is not positive as well.  In addition to diarrhea and fever and headache she is also having a cough and shortness of breath.  Educated on the increase of symptoms and need to go to the nearest emergency room.  Additionally she is educated on getting testing done through [facility].  She reports that there is a facility that has a sinusitis that she can get testing near where she lives.  We have going on in place that order to be on the safe side.  - Novel Coronavirus, NAA ([facility])  2. Fever, unspecified fever  cause Educated to make sure that she stay hydrated and using Tylenol to help with fever and ongoing body aches and pains.  In addition to having diarrhea she needs to stay hydrated as well for that.  - Novel Coronavirus, NAA ([facility])  3. Intractable headache, unspecified chronicity pattern, unspecified headache type Educated on making sure that she uses Tylenol to help with fever and headaches and body aches and pains.  - Novel Coronavirus, NAA ([facility])   Time:   Today, I have spent 10 minutes with the patient with telehealth technology discussing the above problems.     Medication Adjustments/Labs and Tests Ordered: Current medicines are reviewed at length with the patient today.  Concerns regarding medicines are outlined above.   Tests Ordered: Orders Placed This Encounter  Procedures  . Novel Coronavirus, NAA ([facility])    Medication Changes: No orders of the defined types were placed in this encounter.   Disposition:  Follow up as needed  Signed, Perlie Mayo, NP  11/18/2018 3:48 PM     [facility]

## 2018-11-18 NOTE — Patient Instructions (Addendum)
Thank you for completing your visit via telephone. I appreciate the opportunity to provide you with the care for your health and wellness. Today we discussed:   Follow Up: as needed  Current recommendations are symptom management.  Please use Imodium to help with diarrhea.  Please use Pepto-Bismol or antacids to help with any nausea or indigestion.  Please make sure you are staying hydrated with water low sugar version and Gatorade.  Additionally make sure that you are keeping an eye on your fever use Tylenol for body aches, headaches and fever.  If your fever does not respond to Tylenol we need to know this.  If your cough and shortness of breath worsen over the next few days pending what your labs say either way he should probably go to the hospital to be evaluated.  As each person is impacted differently with this particular virus.  And you do not know how he will respond.  Additionally, I would recommend that you are quarantined until you know the results of your test.  Please make sure that you are safe when you are out in public.  Make sure you wear a mask and wash your hands.  [place] YOUR HANDS WELL AND FREQUENTLY. AVOID TOUCHING YOUR FACE, UNLESS YOUR HANDS ARE FRESHLY WASHED.  GET FRESH AIR DAILY. STAY HYDRATED WITH WATER.   It was a pleasure to see you and I look forward to continuing to work together on your health and well-being. Please do not hesitate to call the office if you need care or have questions about your care.  Have a wonderful day and week.  With Gratitude,  Cherly Beach, DNP, AGNP-BC      Person Under Monitoring Name: Anna Cantu  Location: [address] [place] [address]   Infection Prevention Recommendations for Individuals Confirmed to have, or Being Evaluated for, 2019 Novel Coronavirus (COVID-19) Infection Who Receive Care at Home  Individuals who are confirmed to have, or are being evaluated for, COVID-19 should follow  the prevention steps below until a healthcare provider or local or state health department says they can return to normal activities.  Stay home except to get medical care You should restrict activities outside your home, except for getting medical care. Do not go to work, school, or public areas, and do not use public transportation or taxis.  Call ahead before visiting your doctor Before your medical appointment, call the healthcare provider and tell them that you have, or are being evaluated for, COVID-19 infection. This will help the healthcare provider's office take steps to keep other people from getting infected. Ask your healthcare provider to call the local or state health department.  Monitor your symptoms Seek prompt medical attention if your illness is worsening (e.g., difficulty breathing). Before going to your medical appointment, call the healthcare provider and tell them that you have, or are being evaluated for, COVID-19 infection. Ask your healthcare provider to call the local or state health department.  Wear a facemask You should wear a facemask that covers your nose and mouth when you are in the same room with other people and when you visit a healthcare provider. People who live with or visit you should also wear a facemask while they are in the same room with you.  Separate yourself from other people in your home As much as possible, you should stay in a different room from other people in your home. Also, you should use a separate  bathroom, if available.  Avoid sharing household items You should not share dishes, drinking glasses, cups, eating utensils, towels, bedding, or other items with other people in your home. After using these items, you should wash them thoroughly with soap and water.  Cover your coughs and sneezes Cover your mouth and nose with a tissue when you cough or sneeze, or you can cough or sneeze into your sleeve. Throw used tissues in a lined  trash can, and immediately wash your hands with soap and water for at least 20 seconds or use an alcohol-based hand rub.  Wash your [facility] your hands often and thoroughly with soap and water for at least 20 seconds. You can use an alcohol-based hand sanitizer if soap and water are not available and if your hands are not visibly dirty. Avoid touching your eyes, nose, and mouth with unwashed hands.   Prevention Steps for Caregivers and Household Members of Individuals Confirmed to have, or Being Evaluated for, COVID-19 Infection Being Cared for in the Home  If you live with, or provide care at home for, a person confirmed to have, or being evaluated for, COVID-19 infection please follow these guidelines to prevent infection:  Follow healthcare provider's instructions Make sure that you understand and can help the patient follow any healthcare provider instructions for all care.  Provide for the patient's basic needs You should help the patient with basic needs in the home and provide support for getting groceries, prescriptions, and other personal needs.  Monitor the patient's symptoms If they are getting sicker, call his or her medical provider and tell them that the patient has, or is being evaluated for, COVID-19 infection. This will help the healthcare provider's office take steps to keep other people from getting infected. Ask the healthcare provider to call the local or state health department.  Limit the number of people who have contact with the patient  If possible, have only one caregiver for the patient.  Other household members should stay in another home or place of residence. If this is not possible, they should stay  in another room, or be separated from the patient as much as possible. Use a separate bathroom, if available.  Restrict visitors who do not have an essential need to be in the home.  Keep older adults, very young children, and other sick people away  from the patient Keep older adults, very young children, and those who have compromised immune systems or chronic health conditions away from the patient. This includes people with chronic heart, lung, or kidney conditions, diabetes, and cancer.  Ensure good ventilation Make sure that shared spaces in the home have good air flow, such as from an air conditioner or an opened window, weather permitting.  Wash your hands often  Wash your hands often and thoroughly with soap and water for at least 20 seconds. You can use an alcohol based hand sanitizer if soap and water are not available and if your hands are not visibly dirty.  Avoid touching your eyes, nose, and mouth with unwashed hands.  Use disposable paper towels to dry your hands. If not available, use dedicated cloth towels and replace them when they become wet.  Wear a facemask and gloves  Wear a disposable facemask at all times in the room and gloves when you touch or have contact with the patient's blood, body fluids, and/or secretions or excretions, such as sweat, saliva, sputum, nasal mucus, vomit, urine, or feces.  Ensure the mask fits  over your nose and mouth tightly, and do not touch it during use.  Throw out disposable facemasks and gloves after using them. Do not reuse.  Wash your hands immediately after removing your facemask and gloves.  If your personal clothing becomes contaminated, carefully remove clothing and launder. Wash your hands after handling contaminated clothing.  Place all used disposable facemasks, gloves, and other waste in a lined container before disposing them with other household waste.  Remove gloves and wash your hands immediately after handling these items.  Do not share dishes, glasses, or other household items with the patient  Avoid sharing household items. You should not share dishes, drinking glasses, cups, eating utensils, towels, bedding, or other items with a patient who is confirmed to  have, or being evaluated for, COVID-19 infection.  After the person uses these items, you should wash them thoroughly with soap and water.  Wash laundry thoroughly  Immediately remove and wash clothes or bedding that have blood, body fluids, and/or secretions or excretions, such as sweat, saliva, sputum, nasal mucus, vomit, urine, or feces, on them.  Wear gloves when handling laundry from the patient.  Read and follow directions on labels of laundry or clothing items and detergent. In general, wash and dry with the warmest temperatures recommended on the label.  Clean all areas the individual has used often  Clean all touchable surfaces, such as counters, tabletops, doorknobs, bathroom fixtures, toilets, phones, keyboards, tablets, and bedside tables, every day. Also, clean any surfaces that may have blood, body fluids, and/or secretions or excretions on them.  Wear gloves when cleaning surfaces the patient has come in contact with.  Use a diluted bleach solution (e.g., dilute bleach with 1 part bleach and 10 parts water) or a household disinfectant with a label that says EPA-registered for coronaviruses. To make a bleach solution at home, add 1 tablespoon of bleach to 1 quart (4 cups) of water. For a larger supply, add  cup of bleach to 1 gallon (16 cups) of water.  Read labels of cleaning products and follow recommendations provided on product labels. Labels contain instructions for safe and effective use of the cleaning product including precautions you should take when applying the product, such as wearing gloves or eye protection and making sure you have good ventilation during use of the product.  Remove gloves and wash hands immediately after cleaning.  Monitor yourself for signs and symptoms of illness Caregivers and household members are considered close contacts, should monitor their health, and will be asked to limit movement outside of the home to the extent possible. Follow  the monitoring steps for close contacts listed on the symptom monitoring form.   ? If you have additional questions, contact your local health department or call the epidemiologist on call at [phone] (available 24/7). ? This guidance is subject to change. For the most up-to-date guidance from [facility], please refer to their website: YouBlogs.pl

## 2018-11-18 NOTE — Telephone Encounter (Signed)
-----   Message from [place] sent at 11/18/2018  3:37 PM EDT ----- Regarding: covid 19 testing Patient needs testing. Ordering provider is Cherly Beach NP. Please contact patient with appt info. I think I ordered it correctly.   Thank you, Abby W.

## 2018-11-18 NOTE — Telephone Encounter (Signed)
Pt scheduled for 11/19/18 @ 8:15 @ [facility]. Instructions given order placed

## 2018-11-19 ENCOUNTER — Other Ambulatory Visit: Payer: Self-pay

## 2018-11-19 ENCOUNTER — Encounter: Payer: Self-pay | Admitting: Family Medicine

## 2018-11-19 DIAGNOSIS — Z20822 Contact with and (suspected) exposure to covid-19: Secondary | ICD-10-CM

## 2018-11-23 ENCOUNTER — Other Ambulatory Visit: Payer: Self-pay

## 2018-11-23 ENCOUNTER — Other Ambulatory Visit: Payer: No Typology Code available for payment source

## 2018-11-23 ENCOUNTER — Ambulatory Visit: Payer: No Typology Code available for payment source | Admitting: Family Medicine

## 2018-11-23 DIAGNOSIS — Z20822 Contact with and (suspected) exposure to covid-19: Secondary | ICD-10-CM

## 2018-11-26 LAB — NOVEL CORONAVIRUS, NAA: SARS-CoV-2, NAA: NOT DETECTED

## 2018-11-30 ENCOUNTER — Other Ambulatory Visit: Payer: Self-pay

## 2018-11-30 ENCOUNTER — Encounter: Payer: Self-pay | Admitting: Family Medicine

## 2018-11-30 ENCOUNTER — Telehealth (INDEPENDENT_AMBULATORY_CARE_PROVIDER_SITE_OTHER): Payer: No Typology Code available for payment source | Admitting: Family Medicine

## 2018-11-30 DIAGNOSIS — Z6831 Body mass index (BMI) 31.0-31.9, adult: Secondary | ICD-10-CM

## 2018-11-30 DIAGNOSIS — J3089 Other allergic rhinitis: Secondary | ICD-10-CM

## 2018-11-30 DIAGNOSIS — R52 Pain, unspecified: Secondary | ICD-10-CM

## 2018-11-30 DIAGNOSIS — M5431 Sciatica, right side: Secondary | ICD-10-CM | POA: Diagnosis not present

## 2018-11-30 DIAGNOSIS — E6609 Other obesity due to excess calories: Secondary | ICD-10-CM | POA: Diagnosis not present

## 2018-11-30 MED ORDER — METHYLPREDNISOLONE ACETATE 80 MG/ML IJ SUSP
80.0000 mg | Freq: Once | INTRAMUSCULAR | Status: AC
  Administered 2018-11-30: 80 mg via INTRAMUSCULAR

## 2018-11-30 MED ORDER — PREDNISONE 5 MG (21) PO TBPK: 5.0000 mg | ORAL_TABLET | ORAL | 0 refills | Status: AC

## 2018-11-30 MED ORDER — FLUCONAZOLE 150 MG PO TABS: 150.0000 mg | ORAL_TABLET | Freq: Once | ORAL | 0 refills | Status: AC

## 2018-11-30 MED ORDER — DIAZEPAM 5 MG PO TABS: ORAL_TABLET | ORAL | 3 refills | Status: DC

## 2018-11-30 MED ORDER — HYDROCODONE-ACETAMINOPHEN 7.5-325 MG PO TABS: ORAL_TABLET | ORAL | 0 refills | Status: DC

## 2018-11-30 MED ORDER — KETOROLAC TROMETHAMINE 60 MG/2ML IM SOLN
60.0000 mg | Freq: Once | INTRAMUSCULAR | Status: AC
  Administered 2018-11-30: 60 mg via INTRAMUSCULAR

## 2018-11-30 MED ORDER — HYDROCODONE-ACETAMINOPHEN 7.5-325 MG PO TABS: ORAL_TABLET | ORAL | 0 refills | Status: AC

## 2018-11-30 NOTE — Patient Instructions (Addendum)
Annual exam with MD and pap in 11 weeks, call if you need me sooner  Please keep upcoming appt for your mammogram   Please come in by 11:30 this morning for Toradol 60 mg IM and depo Medrol 80 mg IM for knee pain and allergies  For uncontrolled allergies, prednisone dose pack is also prescribed and fluconazole   Your  Chronic pain management is reviewed and scripts will be sent for the next 12 weeks  Thanks for choosing [facility], we consider it a privelige to serve you.   Social distancing. Frequent hand washing with soap and water Keeping your hands off of your face. These 3 practices will help to keep both you and your community healthy during this time. Please practice them faithfully!

## 2018-11-30 NOTE — Assessment & Plan Note (Signed)
Uncontrolled.Toradol 60 mg and depo medrol 80 mg iM in office today and prednisone 10 mg dose pack is prescribed

## 2018-11-30 NOTE — Assessment & Plan Note (Signed)
The patient's Controlled Substance registry is reviewed and compliance confirmed. Adequacy of  Pain control and level of function is assessed. Medication dosing is adjusted as deemed appropriate. Twelve weeks of medication is prescribed , patient signs for the script and is provided with a follow up appointment between 11 to 12 weeks .  

## 2018-11-30 NOTE — Progress Notes (Signed)
Virtual Visit via Telephone Note  I connected with Anna Cantu on 11/30/18 at  8:40 AM EDT by telephone and verified that I am speaking with the correct person using two identifiers.  Location: Patient: home, coming in today by 11:30 amd for injection Provider: office   I discussed the limitations, risks, security and privacy concerns of performing an evaluation and management service by telephone and the availability of in person appointments. I also discussed with the patient that there may be a patient responsible charge related to this service. The patient expressed understanding and agreed to proceed.   History of Present Illness: F/u chronic problems and chronic pain management' 1 week h/o increased allergy symptoms, clear runnny nose and sinus pressure, denies fever , chills, sore throat or productive cough Was feeling ill 2 weeks ago, with positive covid exposure, tested negative last week C/o increased and uncontrolled knee pain, states needs intra articular injection but making no appt at this time   Observations/Objective:  BP 110/70   Ht 5\' 4"  (1.626 m)   Wt 187 lb (84.8 kg)   LMP 07/01/2013   BMI 32.10 kg/m  Good communication with no confusion and intact memory. Alert and oriented x 3 No signs of respiratory distress during speech, pt in pain   Assessment and Plan: Allergic rhinitis Uncontrolled, prednisone dose pack and Depo medrol  80 mg IM  Back pain with right-sided sciatica Uncontrolled.Toradol 60 mg and depo medrol 80 mg iM in office today and prednisone 10 mg dose pack is prescribed  Encounter for pain management The patient's Controlled Substance registry is reviewed and compliance confirmed. Adequacy of  Pain control and level of function is assessed. Medication dosing is adjusted as deemed appropriate. Twelve weeks of medication is prescribed , patient signs for the script and is provided with a follow up appointment between 11 to 12 weeks  .   Obesity  Patient re-educated about  the importance of commitment to a  minimum of 150 minutes of exercise per week as able.  The importance of healthy food choices with portion control discussed, as well as eating regularly and within a 12 hour window most days. The need to choose "clean , green" food 50 to 75% of the time is discussed, as well as to make water the primary drink and set a goal of 64 ounces water daily.    Weight /BMI 11/30/2018 11/18/2018 09/22/2018  WEIGHT 187 lb 190 lb 185 lb  HEIGHT 5\' 4"  5\' 4"  5\' 4"   BMI 32.1 kg/m2 32.61 kg/m2 31.76 kg/m2        Follow Up Instructions:    I discussed the assessment and treatment plan with the patient. The patient was provided an opportunity to ask questions and all were answered. The patient agreed with the plan and demonstrated an understanding of the instructions.   The patient was advised to call back or seek an in-person evaluation if the symptoms worsen or if the condition fails to improve as anticipated.  I provided 25 minutes of non-face-to-face time during this encounter.   [name], MD

## 2018-11-30 NOTE — Assessment & Plan Note (Signed)
  Patient re-educated about  the importance of commitment to a  minimum of 150 minutes of exercise per week as able.  The importance of healthy food choices with portion control discussed, as well as eating regularly and within a 12 hour window most days. The need to choose "clean , green" food 50 to 75% of the time is discussed, as well as to make water the primary drink and set a goal of 64 ounces water daily.    Weight /BMI 11/30/2018 11/18/2018 09/22/2018  WEIGHT 187 lb 190 lb 185 lb  HEIGHT 5\' 4"  5\' 4"  5\' 4"   BMI 32.1 kg/m2 32.61 kg/m2 31.76 kg/m2

## 2018-11-30 NOTE — Assessment & Plan Note (Signed)
Uncontrolled, prednisone dose pack and Depo medrol  80 mg IM

## 2018-12-03 ENCOUNTER — Other Ambulatory Visit: Payer: Self-pay | Admitting: Family Medicine

## 2018-12-09 ENCOUNTER — Ambulatory Visit (HOSPITAL_COMMUNITY): Admission: RE | Admit: 2018-12-09 | Payer: No Typology Code available for payment source | Source: Ambulatory Visit

## 2018-12-17 ENCOUNTER — Other Ambulatory Visit: Payer: Self-pay | Admitting: Family Medicine

## 2018-12-18 ENCOUNTER — Encounter: Payer: Self-pay | Admitting: Family Medicine

## 2019-02-03 ENCOUNTER — Encounter: Payer: Self-pay | Admitting: Family Medicine

## 2019-02-03 ENCOUNTER — Ambulatory Visit (INDEPENDENT_AMBULATORY_CARE_PROVIDER_SITE_OTHER): Payer: No Typology Code available for payment source | Admitting: Family Medicine

## 2019-02-03 ENCOUNTER — Other Ambulatory Visit: Payer: Self-pay

## 2019-02-03 VITALS — BP 130/84 | Ht 64.0 in | Wt 185.0 lb

## 2019-02-03 DIAGNOSIS — M62838 Other muscle spasm: Secondary | ICD-10-CM | POA: Diagnosis not present

## 2019-02-03 DIAGNOSIS — E6609 Other obesity due to excess calories: Secondary | ICD-10-CM

## 2019-02-03 DIAGNOSIS — Z6831 Body mass index (BMI) 31.0-31.9, adult: Secondary | ICD-10-CM | POA: Diagnosis not present

## 2019-02-03 DIAGNOSIS — R52 Pain, unspecified: Secondary | ICD-10-CM | POA: Diagnosis not present

## 2019-02-03 MED ORDER — DIAZEPAM 5 MG PO TABS: ORAL_TABLET | ORAL | 2 refills | Status: DC

## 2019-02-03 MED ORDER — HYDROCODONE-ACETAMINOPHEN 7.5-325 MG PO TABS: ORAL_TABLET | ORAL | 0 refills | Status: AC

## 2019-02-03 NOTE — Progress Notes (Signed)
Virtual Visit via Telephone Note  I connected with Anna Cantu on 02/03/19 at  1:00 PM EDT by telephone and verified that I am speaking with the correct person using two identifiers.  Location: Patient: home  Provider: office   I discussed the limitations, risks, security and privacy concerns of performing an evaluation and management service by telephone and the availability of in person appointments. I also discussed with the patient that there may be a patient responsible charge related to this service. The patient expressed understanding and agreed to proceed.   History of Present Illness: C/o chronic back pain , unrelieved, states recently bought lumbar and buttock pillow to help with pain. States her job is going to provide her with a desk that rises, and she expects this will help a lot Denies recent fever or chills. Denies sinus pressure, nasal congestion, ear pain or sore throat. Denies chest congestion, productive cough or wheezing. Denies chest pains, palpitations and leg swelling Denies abdominal pain, nausea, vomiting,diarrhea or constipation.   Denies dysuria, frequency, hesitancy or incontinence.  Denies depression, anxiety or insomnia. Denies skin break down or rash.     Observations/Objective: Pt reported BP 130/84   Ht 5\' 4"  (1.626 m)   Wt 185 lb (83.9 kg)   LMP 07/01/2013   BMI 31.76 kg/m   Good communication with no confusion and intact memory. Alert and oriented x 3 No signs of respiratory distress during speech    Assessment and Plan: Encounter for pain management The patient's Controlled Substance registry is reviewed and compliance confirmed. Adequacy of  Pain control and level of function is assessed. Medication dosing is adjusted as deemed appropriate. Twelve weeks of medication is prescribed , patient signs for the script and is provided with a follow up appointment between 11 to 12 weeks .   Obesity  Patient re-educated about   the importance of commitment to a  minimum of 150 minutes of exercise per week as able.  The importance of healthy food choices with portion control discussed, as well as eating regularly and within a 12 hour window most days. The need to choose "clean , green" food 50 to 75% of the time is discussed, as well as to make water the primary drink and set a goal of 64 ounces water daily.    Weight /BMI 02/03/2019 11/30/2018 11/18/2018  WEIGHT 185 lb 187 lb 190 lb  HEIGHT 5\' 4"  5\' 4"  5\' 4"   BMI 31.76 kg/m2 32.1 kg/m2 32.61 kg/m2      Neck muscle spasm Controlled with bedtime muscle relaxant    Follow Up Instructions:    I discussed the assessment and treatment plan with the patient. The patient was provided an opportunity to ask questions and all were answered. The patient agreed with the plan and demonstrated an understanding of the instructions.   The patient was advised to call back or seek an in-person evaluation if the symptoms worsen or if the condition fails to improve as anticipated.  I provided 15 minutes of non-face-to-face time during this encounter.   [name], MD

## 2019-02-06 ENCOUNTER — Encounter: Payer: Self-pay | Admitting: Family Medicine

## 2019-02-06 MED ORDER — HYDROCODONE-ACETAMINOPHEN 7.5-325 MG PO TABS: ORAL_TABLET | ORAL | 0 refills | Status: AC

## 2019-02-06 MED ORDER — HYDROCODONE-ACETAMINOPHEN 7.5-325 MG PO TABS: ORAL_TABLET | ORAL | 0 refills | Status: DC

## 2019-02-06 NOTE — Patient Instructions (Signed)
F/U in 3 months in office , call if you need me sooner  Reduced dose of medication as discussed

## 2019-02-06 NOTE — Assessment & Plan Note (Signed)
  Patient re-educated about  the importance of commitment to a  minimum of 150 minutes of exercise per week as able.  The importance of healthy food choices with portion control discussed, as well as eating regularly and within a 12 hour window most days. The need to choose "clean , green" food 50 to 75% of the time is discussed, as well as to make water the primary drink and set a goal of 64 ounces water daily.    Weight /BMI 02/03/2019 11/30/2018 11/18/2018  WEIGHT 185 lb 187 lb 190 lb  HEIGHT 5\' 4"  5\' 4"  5\' 4"   BMI 31.76 kg/m2 32.1 kg/m2 32.61 kg/m2

## 2019-02-06 NOTE — Assessment & Plan Note (Signed)
Controlled with bedtime muscle relaxant

## 2019-02-06 NOTE — Assessment & Plan Note (Signed)
The patient's Controlled Substance registry is reviewed and compliance confirmed. Adequacy of  Pain control and level of function is assessed. Medication dosing is adjusted as deemed appropriate. Twelve weeks of medication is prescribed , patient signs for the script and is provided with a follow up appointment between 11 to 12 weeks .  

## 2019-03-04 ENCOUNTER — Encounter: Payer: No Typology Code available for payment source | Admitting: Family Medicine

## 2019-04-05 ENCOUNTER — Encounter: Payer: Self-pay | Admitting: Family Medicine

## 2019-04-05 ENCOUNTER — Ambulatory Visit (INDEPENDENT_AMBULATORY_CARE_PROVIDER_SITE_OTHER): Payer: 59 | Admitting: Family Medicine

## 2019-04-05 ENCOUNTER — Other Ambulatory Visit (HOSPITAL_COMMUNITY)
Admission: RE | Admit: 2019-04-05 | Discharge: 2019-04-05 | Disposition: A | Discharge status: Home / Self Care | Payer: 59 | Source: Ambulatory Visit | Attending: Family Medicine | Admitting: Family Medicine

## 2019-04-05 ENCOUNTER — Other Ambulatory Visit: Payer: Self-pay

## 2019-04-05 VITALS — BP 112/78 | HR 88 | Temp 98.0°F | Resp 15 | Ht 64.0 in | Wt 176.0 lb

## 2019-04-05 DIAGNOSIS — Z1231 Encounter for screening mammogram for malignant neoplasm of breast: Secondary | ICD-10-CM

## 2019-04-05 DIAGNOSIS — Z124 Encounter for screening for malignant neoplasm of cervix: Secondary | ICD-10-CM | POA: Diagnosis not present

## 2019-04-05 DIAGNOSIS — R52 Pain, unspecified: Secondary | ICD-10-CM

## 2019-04-05 DIAGNOSIS — K648 Other hemorrhoids: Secondary | ICD-10-CM | POA: Diagnosis not present

## 2019-04-05 DIAGNOSIS — G8929 Other chronic pain: Secondary | ICD-10-CM | POA: Diagnosis not present

## 2019-04-05 DIAGNOSIS — Z0000 Encounter for general adult medical examination without abnormal findings: Secondary | ICD-10-CM | POA: Diagnosis not present

## 2019-04-05 DIAGNOSIS — M5440 Lumbago with sciatica, unspecified side: Secondary | ICD-10-CM

## 2019-04-05 DIAGNOSIS — Z23 Encounter for immunization: Secondary | ICD-10-CM

## 2019-04-05 DIAGNOSIS — R87810 Cervical high risk human papillomavirus (HPV) DNA test positive: Secondary | ICD-10-CM

## 2019-04-05 MED ORDER — HYDROCODONE-ACETAMINOPHEN 7.5-325 MG PO TABS: ORAL_TABLET | ORAL | 0 refills | Status: AC

## 2019-04-05 MED ORDER — HYDROCODONE-ACETAMINOPHEN 7.5-325 MG PO TABS: ORAL_TABLET | ORAL | 0 refills | Status: DC

## 2019-04-05 MED ORDER — KETOROLAC TROMETHAMINE 60 MG/2ML IM SOLN
60.0000 mg | Freq: Once | INTRAMUSCULAR | Status: AC
  Administered 2019-04-05: 60 mg via INTRAMUSCULAR

## 2019-04-05 MED ORDER — METHYLPREDNISOLONE ACETATE 80 MG/ML IJ SUSP
80.0000 mg | Freq: Once | INTRAMUSCULAR | Status: AC
  Administered 2019-04-05: 80 mg via INTRAMUSCULAR

## 2019-04-05 MED ORDER — DIAZEPAM 5 MG PO TABS: ORAL_TABLET | ORAL | 1 refills | Status: AC

## 2019-04-05 NOTE — Progress Notes (Signed)
    Anna Cantu     MRN: [id number]      DOB: 1965-11-21  HPI: Patient is in for annual physical exam. C/o increased and uncontrolled back pain, requests injections today for this, just had injections in both knees earlier today Chronic pain management is reviewed, clarification reduce valium to 5 days/ week, continue daily hydrocodone C/o symptomatic large intermal hemmorhoids  Immunization is reviewed , and  updated .   PE: BP 112/78   Pulse 88   Temp 98 F (36.7 C) (Temporal)   Resp 15   Ht 5\' 4"  (1.626 m)   Wt 176 lb (79.8 kg)   LMP 07/01/2013   SpO2 99%   BMI 30.21 kg/m  Pleasant  female, alert and oriented x 3, in no cardio-pulmonary distress. Afebrile. HEENT No facial trauma or asymetry. Sinuses non tender.  Extra occullar muscles intact.. External ears normal, . Neck: supple, no adenopathy,JVD or thyromegaly.No bruits.  Chest: Clear to ascultation bilaterally.No crackles or wheezes. Non tender to palpation  Breast: No asymetry,no masses or lumps. No tenderness. No nipple discharge or inversion. No axillary or supraclavicular adenopathy  Cardiovascular system; Heart sounds normal,  S1 and  S2 ,no S3.  No murmur, or thrill. Apical beat not displaced Peripheral pulses normal.  Abdomen: Soft, non tender, no organomegaly or masses. No bruits. Bowel sounds normal. No guarding, tenderness or rebound.   GU: External genitalia normal female genitalia , normal female distribution of hair. No lesions. Urethral meatus normal in size, no  Prolapse, no lesions visibly  Present. Bladder non tender. Vagina pink and moist , with no visible lesions , discharge present . Adequate pelvic support no  cystocele or rectocele noted Cervix pink and appears healthy, no lesions or ulcerations noted, no discharge noted from os Uterus normal size, no adnexal masses, no cervical motion or adnexal tenderness.   Musculoskeletal exam: Decreased  ROM of lumbar  spine,adequate in  hips , shoulders and knees.  deformity ,swelling and  crepitus noted in both klnees No muscle wasting or atrophy.   Neurologic: Cranial nerves 2 to 12 intact. Power, tone ,sensation and reflexes normal throughout. No disturbance in gait. No tremor.  Skin: Intact, no ulceration, erythema , scaling or rash noted. Pigmentation normal throughout  Psych; Normal mood and affect. Judgement and concentration normal   Assessment & Plan:  Annual physical exam Annual exam as documented. Counseling done  re healthy lifestyle involving commitment to 150 minutes exercise per week, heart healthy diet, and attaining healthy weight.The importance of adequate sleep also discussed. Regular seat belt use and home safety, is also discussed. Changes in health habits are decided on by the patient with goals and time frames  set for achieving them. Immunization and cancer screening needs are specifically addressed at this visit.   Encounter for pain management The patient's Controlled Substance registry is reviewed and compliance confirmed. Adequacy of  Pain control and level of function is assessed. Medication dosing is adjusted as deemed appropriate. Twelve weeks of medication is prescribed , patient signs for the script and is provided with a follow up appointment between 11 to 12 weeks .   Hemorrhoids, internal Increasingly symptomatic, refer to surgery  Back pain Uncontrolled.Toradol 60 mg and depo medrol 80 mg administered IM in the office ,   Cervical high risk HPV (human papillomavirus) test positive Pap sent 03/2019

## 2019-04-05 NOTE — Assessment & Plan Note (Signed)
The patient's Controlled Substance registry is reviewed and compliance confirmed. Adequacy of  Pain control and level of function is assessed. Medication dosing is adjusted as deemed appropriate. Twelve weeks of medication is prescribed , patient signs for the script and is provided with a follow up appointment between 11 to 12 weeks .  

## 2019-04-05 NOTE — Assessment & Plan Note (Signed)
Pap sent 03/2019

## 2019-04-05 NOTE — Assessment & Plan Note (Signed)

## 2019-04-05 NOTE — Patient Instructions (Addendum)
F/U 3rd week in February, call if you need me sooner  Please schedule mammogram at checkout, early morning in next 2 weeks if possible,starts work at 8: 30 am.If not after 5: 30 pm if they are done this late  Flu vaccine today  Toradol 60 mg and depo medrol 80 mg iM in office today for back pain  Medications will be prescribed, dose reduction in valium to 5 tablets per week  Please get fasting cBC, lipid, cmp and EGFR,  and vit D this week  You are referred to Surgeon, Dr Erma Haw re hemmorhoids  Next colonoscopy is due in 2022  It is important that you exercise regularly at least 30 minutes 5 times a week. If you develop chest pain, have severe difficulty breathing, or feel very tired, stop exercising immediately and seek medical attention  Thanks for choosing [facility], we consider it a privelige to serve you. Congrats on weight loss

## 2019-04-05 NOTE — Assessment & Plan Note (Signed)
Increasingly symptomatic, refer to surgery

## 2019-04-05 NOTE — Assessment & Plan Note (Addendum)
Uncontrolled.Toradol 60 mg and depo medrol 80 mg administered IM in the office ,

## 2019-04-09 LAB — CYTOLOGY - PAP
Comment: NEGATIVE
Diagnosis: NEGATIVE
High risk HPV: NEGATIVE

## 2019-04-13 ENCOUNTER — Ambulatory Visit: Payer: Self-pay | Admitting: General Surgery

## 2019-04-28 ENCOUNTER — Ambulatory Visit (HOSPITAL_COMMUNITY): Payer: 59

## 2019-05-03 ENCOUNTER — Encounter: Payer: Self-pay | Admitting: Family Medicine

## 2019-05-03 ENCOUNTER — Ambulatory Visit (INDEPENDENT_AMBULATORY_CARE_PROVIDER_SITE_OTHER): Payer: 59 | Admitting: Family Medicine

## 2019-05-03 ENCOUNTER — Other Ambulatory Visit: Payer: Self-pay

## 2019-05-03 VITALS — BP 112/76 | Temp 97.0°F | Ht 64.0 in | Wt 176.0 lb

## 2019-05-03 DIAGNOSIS — J3089 Other allergic rhinitis: Secondary | ICD-10-CM

## 2019-05-03 DIAGNOSIS — G43909 Migraine, unspecified, not intractable, without status migrainosus: Secondary | ICD-10-CM | POA: Insufficient documentation

## 2019-05-03 DIAGNOSIS — G43919 Migraine, unspecified, intractable, without status migrainosus: Secondary | ICD-10-CM | POA: Diagnosis not present

## 2019-05-03 DIAGNOSIS — R109 Unspecified abdominal pain: Secondary | ICD-10-CM | POA: Diagnosis not present

## 2019-05-03 MED ORDER — HYOSCYAMINE SULFATE ER 0.375 MG PO TB12: 0.3750 mg | ORAL_TABLET | Freq: Two times a day (BID) | ORAL | 0 refills | Status: DC | PRN

## 2019-05-03 MED ORDER — BUTALBITAL-APAP-CAFFEINE 50-325-40 MG PO TABS: ORAL_TABLET | ORAL | 0 refills | Status: DC

## 2019-05-03 MED ORDER — FLUCONAZOLE 150 MG PO TABS: 150.0000 mg | ORAL_TABLET | Freq: Once | ORAL | 0 refills | Status: AC

## 2019-05-03 MED ORDER — PREDNISONE 10 MG PO TABS: 10.0000 mg | ORAL_TABLET | Freq: Two times a day (BID) | ORAL | 0 refills | Status: DC

## 2019-05-03 NOTE — Progress Notes (Signed)
Virtual Visit via Telephone Note  I connected with Anna Cantu on 05/03/19 at 10:20 AM EST by telephone and verified that I am speaking with the correct person using two identifiers.  Location: Patient: home Provider: office   I discussed the limitations, risks, security and privacy concerns of performing an evaluation and management service by telephone and the availability of in person appointments. I also discussed with the patient that there may be a patient responsible charge related to this service. The patient expressed understanding and agreed to proceed.   History of Present Illness: 5 day h/o intermittent headache , thinks mainly sinus, though right sided throbbing Loose stool x 2 days until yesterday, today when leaving stomach again started cramping , nausea no vomitmax bM   3 days ago, liquid yellow and close contact with similar symptoms Denies recent fever or chills. Denies sinus pressure, nasal congestion, ear pain or sore throat. Denies chest congestion, productive cough or wheezing. Denies chest pains, palpitations and leg swelling  Denies dysuria, frequency, hesitancy or incontinence. C/o chronic joint pain, swelling and limitation in mobility. C/o  headaches, denies seizures, numbness, or tingling. Denies depression, anxiety or insomnia. Denies skin break down or rash.     Observations/Objective:  BP 112/76   Temp (!) 97 F (36.1 C) (Temporal)   Ht 5\' 4"  (1.626 m)   Wt 176 lb (79.8 kg)   LMP 07/01/2013   BMI 30.21 kg/m  Good communication with no confusion and intact memory. Alert and oriented x 3 No signs of respiratory distress during speech   Assessment and Plan: Abdominal cramps Associated wit acute gI distress, treat symptoamtically and encourage frequent small meals  Allergic rhinitis Uncontrolled, n eeds to commit to daily med, and short course of med for acute fare also prescribed  Migraine headache Increased frequency nd  severity currently, fioricet prescribed    Follow Up Instructions:    I discussed the assessment and treatment plan with the patient. The patient was provided an opportunity to ask questions and all were answered. The patient agreed with the plan and demonstrated an understanding of the instructions.   The patient was advised to call back or seek an in-person evaluation if the symptoms worsen or if the condition fails to improve as anticipated.  I provided 18 minutes of non-face-to-face time during this encounter.   [name], MD

## 2019-05-03 NOTE — Patient Instructions (Signed)
F/U as before, call if you need me sooner  Medication is prescribed for cramping abdominal pain  Medication is prescribed for uncontrolled allergies , please use your allergy pills daily during this flare.  Medication is prescribed for migraine headache  Thanks for choosing [facility], we consider it a privelige to serve you.

## 2019-05-04 ENCOUNTER — Other Ambulatory Visit: Payer: Self-pay

## 2019-05-04 ENCOUNTER — Ambulatory Visit: Payer: 59 | Admitting: General Surgery

## 2019-05-04 ENCOUNTER — Encounter: Payer: Self-pay | Admitting: General Surgery

## 2019-05-04 VITALS — BP 126/80 | HR 83 | Temp 98.5°F | Resp 16 | Ht 64.0 in | Wt 167.0 lb

## 2019-05-04 DIAGNOSIS — K602 Anal fissure, unspecified: Secondary | ICD-10-CM

## 2019-05-04 DIAGNOSIS — K643 Fourth degree hemorrhoids: Secondary | ICD-10-CM

## 2019-05-04 NOTE — Patient Instructions (Signed)
Surgical Procedures for Hemorrhoids Surgical procedures can be used to treat hemorrhoids. Hemorrhoids are swollen veins that are inside the rectum (internal hemorrhoids) or around the anus (external hemorrhoids). They are caused by increased pressure in the anal area. This pressure may result from straining to have a bowel movement (constipation), diarrhea, pregnancy, obesity, or sitting for long periods of time. Hemorrhoids can cause symptoms such as pain and bleeding. Surgery may be needed if diet changes, lifestyle changes, and other treatments do not help your symptoms. Common surgical methods that may be used include:  Closed hemorrhoidectomy. The hemorrhoids are surgically removed, and the incisions are closed with stitches (sutures).  Open hemorrhoidectomy. The hemorrhoids are surgically removed, but the incisions are allowed to heal without sutures.  Stapled hemorrhoidectomy. The hemorrhoids are partially removed, and the incisions are closed with staples. Tell a health care provider about:  Any allergies you have.  All medicines you are taking, including vitamins, herbs, eye drops, creams, and over-the-counter medicines.  Any problems you or family members have had with anesthetic medicines.  Any blood disorders you have.  Any surgeries you have had.  Any medical conditions you have.  Whether you are pregnant or may be pregnant. What are the risks? Generally, this is a safe procedure. However, problems may occur, including:  Infection.  Bleeding.  Allergic reactions to medicines.  Damage to other structures or organs.  Pain.  Constipation.  Difficulty passing urine.  Narrowing of the anal canal (stenosis).  Difficulty controlling bowel movements (incontinence).  Recurring hemorrhoids.  A new passage (fistula) that forms between the anus or rectum and another area. What happens before the procedure? Medicines  Ask your health care provider about: ? Changing  or stopping your regular medicines. This is especially important if you are taking diabetes medicines or blood thinners. ? Taking medicines such as aspirin and ibuprofen. These medicines can thin your blood. Do not take these medicines unless your health care provider tells you to take them. ? Taking over-the-counter medicines, vitamins, herbs, and supplements.   General instructions  You may need to have a procedure to examine the inside of your colon with a scope (colonoscopy). Your health care provider may do this to make sure that there are no other causes for your bleeding or pain.  You may be instructed to take a laxative and an enema to clean out your colon before surgery (bowel prep). Carefully follow instructions from your health care provider about bowel prep.  Plan to have someone take you home from the hospital or clinic.  Plan to have a responsible adult care for you for at least 24 hours after you leave the hospital or clinic. This is important.  Ask your health care provider: ? How your surgery site will be marked. ? What steps will be taken to help prevent infection. These may include:  Washing skin with a germ-killing soap.  Taking antibiotic medicine. What happens during the procedure?  An IV will be inserted into one of your veins.  You will be given one or more of the following: ? A medicine to help you relax (sedative). ? A medicine to numb the area (local anesthetic). ? A medicine to make you fall asleep (general anesthetic). ? A medicine that is injected into an area of your body to numb everything below the injection site (regional anesthetic).  A lubricating jelly may be placed into your rectum.  Your surgeon will insert a short scope (anoscope) into your rectum to examine  the hemorrhoids.  One of the following surgical methods will be used to remove the hemorrhoids: ? Closed hemorrhoidectomy.  Your surgeon will use surgical instruments to open the  tissue around the hemorrhoids.  The veins that supply the hemorrhoids will be tied off with a suture.  The hemorrhoids will be removed.  The tissue that surrounds the hemorrhoids will be closed with sutures that your body can absorb (absorbable sutures). ? Open hemorrhoidectomy.  The hemorrhoids will be removed with surgical instruments.  The incisions will be left open to heal without sutures. ? Stapled hemorrhoidectomy.  Your surgeon will use a circular stapling device to partially remove the hemorrhoids.  The device will be inserted into your anus. It will remove a circular ring of tissue that includes hemorrhoid tissue and some tissue above the hemorrhoids.  The staples in the device will close the edges of the tissue. This will cut off the blood supply to any remaining hemorrhoids and pull the tissue back into place. Each of these procedures may vary among health care providers and hospitals. What happens after the procedure?  Your blood pressure, heart rate, breathing rate, and blood oxygen level may be monitored until you leave the hospital or clinic.  You will be given pain medicine as needed.  Do not drive for 24 hours if you were given a sedative during your procedure. Summary  Surgery may be needed for hemorrhoids if diet changes, lifestyle changes, and other treatments do not help your symptoms.  There are three common methods of surgery that are used to treat hemorrhoids.  Follow instructions from your health care provider about taking medicines and about eating and drinking before the procedure.  You may be instructed to take a laxative and an enema to clean out your colon before surgery (bowel prep). This information is not intended to replace advice given to you by your health care provider. Make sure you discuss any questions you have with your health care provider. Document Released: 03/10/2009 Document Revised: 05/06/2018 Document Reviewed: 03/31/2018 Elsevier  Patient Education  2020 [employer].   Hemorrhoids Hemorrhoids are swollen veins that may develop:  In the butt (rectum). These are called internal hemorrhoids.  Around the opening of the butt (anus). These are called external hemorrhoids. Hemorrhoids can cause pain, itching, or bleeding. Most of the time, they do not cause serious problems. They usually get better with diet changes, lifestyle changes, and other home treatments. What are the causes? This condition may be caused by:  Having trouble pooping (constipation).  Pushing hard (straining) to poop.  Watery poop (diarrhea).  Pregnancy.  Being very overweight (obese).  Sitting for long periods of time.  Heavy lifting or other activity that causes you to strain.  Anal sex.  Riding a bike for a long period of time. What are the signs or symptoms? Symptoms of this condition include:  Pain.  Itching or soreness in the butt.  Bleeding from the butt.  Leaking poop.  Swelling in the area.  One or more lumps around the opening of your butt. How is this diagnosed? A doctor can often diagnose this condition by looking at the affected area. The doctor may also:  Do an exam that involves feeling the area with a gloved hand (digital rectal exam).  Examine the area inside your butt using a small tube (anoscope).  Order blood tests. This may be done if you have lost a lot of blood.  Have you get a test that involves looking  inside the colon using a flexible tube with a camera on the end (sigmoidoscopy or colonoscopy). How is this treated? This condition can usually be treated at home. Your doctor may tell you to change what you eat, make lifestyle changes, or try home treatments. If these do not help, procedures can be done to remove the hemorrhoids or make them smaller. These may involve:  Placing rubber bands at the base of the hemorrhoids to cut off their blood supply.  Injecting medicine into the hemorrhoids to  shrink them.  Shining a type of light energy onto the hemorrhoids to cause them to fall off.  Doing surgery to remove the hemorrhoids or cut off their blood supply. Follow these instructions at home: Eating and drinking   Eat foods that have a lot of fiber in them. These include whole grains, beans, nuts, fruits, and vegetables.  Ask your doctor about taking products that have added fiber (fibersupplements).  Reduce the amount of fat in your diet. You can do this by: ? Eating low-fat dairy products. ? Eating less red meat. ? Avoiding processed foods.  Drink enough fluid to keep your pee (urine) pale yellow. Managing pain and swelling   Take a warm-water bath (sitz bath) for 20 minutes to ease pain. Do this 3-4 times a day. You may do this in a bathtub or using a portable sitz bath that fits over the toilet.  If told, put ice on the painful area. It may be helpful to use ice between your warm baths. ? Put ice in a plastic bag. ? Place a towel between your skin and the bag. ? Leave the ice on for 20 minutes, 2-3 times a day. General instructions  Take over-the-counter and prescription medicines only as told by your doctor. ? Medicated creams and medicines may be used as told.  Exercise often. Ask your doctor how much and what kind of exercise is best for you.  Go to the bathroom when you have the urge to poop. Do not wait.  Avoid pushing too hard when you poop.  Keep your butt dry and clean. Use wet toilet paper or moist towelettes after pooping.  Do not sit on the toilet for a long time.  Keep all follow-up visits as told by your doctor. This is important. Contact a doctor if you:  Have pain and swelling that do not get better with treatment or medicine.  Have trouble pooping.  Cannot poop.  Have pain or swelling outside the area of the hemorrhoids. Get help right away if you have:  Bleeding that will not stop. Summary  Hemorrhoids are swollen veins in the  butt or around the opening of the butt.  They can cause pain, itching, or bleeding.  Eat foods that have a lot of fiber in them. These include whole grains, beans, nuts, fruits, and vegetables.  Take a warm-water bath (sitz bath) for 20 minutes to ease pain. Do this 3-4 times a day. This information is not intended to replace advice given to you by your health care provider. Make sure you discuss any questions you have with your health care provider. Document Released: 02/20/2008 Document Revised: 05/21/2018 Document Reviewed: 10/02/2017 Elsevier Patient Education  2020 [employer].

## 2019-05-05 DIAGNOSIS — K602 Anal fissure, unspecified: Secondary | ICD-10-CM | POA: Insufficient documentation

## 2019-05-05 DIAGNOSIS — K643 Fourth degree hemorrhoids: Secondary | ICD-10-CM | POA: Insufficient documentation

## 2019-05-05 NOTE — Progress Notes (Signed)
[facility] History and Physical  Reason for Referral: Hemorrhoids Referring Physician:  Dr. Moshe Cipro   Chief Complaint    Hemorrhoids      Anna Cantu is a 53 y.o. female.  HPI: Anna Cantu is a very sweet 53 yo who has had issues with hemorrhoids for years. She has had a colonoscopy in 2015 for hematochezia that demonstrated some polyps and internal hemorrhoids but no other signs of bleeding, and she says that she was offered banding but she did not pursue this at that time. She says that in addition to some occasional bleeding she has issues with getting clean, having some itching, and at times she feels a tearing/ sharp sensation when she is having a bowel movement. She also says that she has to do maneuvers and essentially disimpact herself with her fingers because she feels like she has stool balls that do not come out. She reports a history of a episiotomy versus tear with her son, and that she has had hemorrhoids and issues with her anus since that time. I asked about pain with sex, but she does not recall and it has been a while since having sexual intercourse. She denied any urinary leakage.   At this time, she is tired of the discomfort and bleeding. She says that the sharp pain with BM is the worse part, and that she tries to keep her stools soft and regular. She denies being on Linzess now but it looks like she was on it in 2015.   Past Medical History:  Diagnosis Date  . Chronic back pain   . Gastritis   . Hemorrhoids   . Nicotine addiction   . Obesity   . Ovarian cyst     Past Surgical History:  Procedure Laterality Date  . btl and endometrial abaltion    . CHOLECYSTECTOMY  2007   Dr. Aviva Signs   . COLONOSCOPY   08/19/2008   Dr. Madolyn Frieze canal hemorrhoids.  Diminutive rectal polyp  status post cold biopsy and removal.  The remainder of rectal mucosa and colon was normal. Path: polypoid.   Marland Kitchen COLONOSCOPY WITH ESOPHAGOGASTRODUODENOSCOPY  (EGD) N/A 07/08/2013   RMR: Rectal and colonic polyps-one tubular adenoma. colonic diverticulosis. Hemorroids-likely source of hematochezia.  NEXT TCS IN 7 YEARS. Small hiatal hernia . Antral erosions;nonbleeding duodenal AVM; status post gastric biopsy with severe H.pylori  . ESOPHAGOGASTRODUODENOSCOPY   08/19/2008   Dr. Volney American esophageal erosions, consistent with erosive reflux esophagitis/small HH/The remainder of her upper GI tract appeared normal  . left knee surgey- arthroscopy June 2011     Dr. Percell Miller   . right carpal tunnel release  1991  . Right Inguinal hernia repair  1982  . right knee surgery  1993  . TUBAL LIGATION      Family History  Problem Relation Age of Onset  . Hypertension Mother   . Stroke Mother   . Hypertension Father   . Breast cancer Paternal Grandmother   . Colon cancer Neg Hx     Social History   Tobacco Use  . Smoking status: Former Smoker    Packs/day: 0.50    Types: Cigarettes  . Smokeless tobacco: Never Used  . Tobacco comment: quit Mar 03, 2013   Substance Use Topics  . Alcohol use: Yes    Comment: occasionally  . Drug use: No    Medications: I have reviewed the patient's current medications. Allergies as of 05/04/2019      Reactions   Ciprofloxacin    ?  rash related to cipro or flagyl vs other reason   Flagyl [metronidazole]    ?rash to cipro or flagyl vs other reason   Amoxicillin Itching, Rash      Medication List       Accurate as of May 04, 2019 11:59 PM. If you have any questions, ask your nurse or doctor.        acetaminophen 500 MG tablet Commonly known as: TYLENOL Take 1 tablet (500 mg total) by mouth every 6 (six) hours as needed.   butalbital-acetaminophen-caffeine 50-325-40 MG tablet Commonly known as: FIORICET Take one tablet by mouth two times daily , as needed, for migraine headache   diazepam 5 MG tablet Commonly known as: VALIUM Take on tablet by mouth at bedtime for neck spasm   diazepam 5 MG  tablet Commonly known as: VALIUM Take one tablet at bedtime as needed, for neck and back  spasm Start taking on: May 06, 2019   diclofenac sodium 1 % Gel Commonly known as: VOLTAREN Apply twice daily to affected areas   ELDERBERRY PO Take 1 Dose by mouth daily.   HYDROcodone-acetaminophen 7.5-325 MG tablet Commonly known as: NORCO Take one tablet by mouth once daily for chronic back pain   HYDROcodone-acetaminophen 7.5-325 MG tablet Commonly known as: Norco Take one tablet by mouth once daily for chronic back pain Start taking on: May 22, 2019   HYDROcodone-acetaminophen 7.5-325 MG tablet Commonly known as: Norco Take one tablet by mouth once daily for chronic back pain Start taking on: June 21, 2019   hyoscyamine 0.375 MG 12 hr tablet Commonly known as: Levbid Take 1 tablet (0.375 mg total) by mouth every 12 (twelve) hours as needed.   ibuprofen 800 MG tablet Commonly known as: ADVIL Take 1 tablet (800 mg total) by mouth every 8 (eight) hours as needed. Max 3 tabs a week   montelukast 10 MG tablet Commonly known as: SINGULAIR Take 1 tablet (10 mg total) by mouth at bedtime.   multivitamin with minerals Tabs tablet Take 1 tablet by mouth daily.   ondansetron 4 MG tablet Commonly known as: ZOFRAN Take 1 tablet (4 mg total) by mouth every 8 (eight) hours as needed for nausea or vomiting.   predniSONE 10 MG tablet Commonly known as: DELTASONE Take 1 tablet (10 mg total) by mouth 2 (two) times daily with a meal.   UNABLE TO FIND Tumeric 2000mg  daily   VITAMIN A PO Take 1 Dose by mouth daily.   vitamin B-12 500 MCG tablet Commonly known as: CYANOCOBALAMIN Take 500 mcg by mouth daily.   vitamin C 1000 MG tablet Take 1,000 mg by mouth daily.   vitamin E 600 UNIT capsule Take 600 Units by mouth daily.   Zinc 15 MG Caps Take 1 capsule by mouth daily.        ROS:  A comprehensive review of systems was negative except for: Gastrointestinal:  positive for constipation and anal pain/ tearing, some itching and hygiene/ cleaning issues  Blood pressure 126/80, pulse 83, temperature 98.5 F (36.9 C), temperature source Oral, resp. rate 16, height 5\' 4"  (1.626 m), weight 167 lb (75.8 kg), last menstrual period 07/01/2013, SpO2 98 %. Physical Exam Vitals signs reviewed.  Constitutional:      Appearance: Normal appearance.  HENT:     Head: Normocephalic and atraumatic.     Nose: Nose normal.     Mouth/Throat:     Mouth: Mucous membranes are moist.  Eyes:     Extraocular  Movements: Extraocular movements intact.     Pupils: Pupils are equal, round, and reactive to light.  Neck:     Musculoskeletal: Normal range of motion. No neck rigidity.  Cardiovascular:     Rate and Rhythm: Normal rate and regular rhythm.  Pulmonary:     Effort: Pulmonary effort is normal.     Breath sounds: Normal breath sounds.  Abdominal:     General: There is no distension.     Palpations: Abdomen is soft.     Tenderness: There is no abdominal tenderness.  Genitourinary:    Rectum: Tenderness, anal fissure, external hemorrhoid and internal hemorrhoid present. Normal anal tone.     Comments: Right posterior and left lateral hemorrhoidal columns, right posterior column with irritated looking mucosa but also could have a verrucous appearance, posterior fissure, chronic appearing between, patulous rectal vault anterior and posterior Musculoskeletal: Normal range of motion.        General: No swelling.  Skin:    General: Skin is warm and dry.  Neurological:     General: No focal deficit present.     Mental Status: She is alert and oriented to person, place, and time.  Psychiatric:        Mood and Affect: Mood normal.        Behavior: Behavior normal.        Thought Content: Thought content normal.        Judgment: Judgment normal.     Results: PAP cytology- no high risk HPV   Assessment & Plan:  ASHRITA MINGE is a 53 y.o. 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  She has multiple things going on and we discussed a staged/ stepwise approach. I think some of her pain is likely from the fissure but her hygiene issues could be from the hemorrhoids as well as the bleeding.  She also has possible irritation versus verrucous type area on the right posterior hemorrhoid column.  Her fissure will likely get worse with the hemorrhoid surgery but she needs hemorrhoid surgery now especially given the concern for the verrucous area on there right posterior hemorrhoid.   -Fissure treatment with diltiazem 2%/ lidocaine 5% QID for the next month -Hemorrhoidectomy for extensive hemorrhoids, grade IV hemorrhoids in January 2021  -Discussed possibility of the verrucous lesion and her recent PAP with no high risk HPV which is reassuring but discussed sending the hemorrhoid columns to pathology as I always do to ensure no suspicious areas and just irritation.   Hemorrhoid surgery for external hemorrhoids is very painful. The pain and discomfort that the patient is having currently will be magnified after the surgery for at least 2-3 weeks.  The patient will have feelings of constant pressure and pain in the area from the swelling and removal of the anoderm (skin around the anus). The internal hemorrhoids are not painful to remove because the same nerves are not involved, and the sensation is different, but removal of any external hemorrhoids will cause significant discomfort. They will need at least 4-6 weeks to recover from the surgery, and should not expect to be able to feel back to "normal for 6-8 weeks."    The risk of hemorrhoid surgery include bleeding, risk of infection although rare, and the risk of narrowing the anal canal if too much tissue is removed. Given this  risk, it is likely  that only the 2 largest hemorrhoid columns would be removed during the initial surgery.  We have also discussed the risk of incontinence after surgery if the muscles were injured, and although this is rare that it can happen and is another reason to limit the amount of hemorrhoids removed.     -May need referral to physical therapy for pelvic floor training in the long run due to her rectocele type symptoms   All questions were answered to the satisfaction of the patient.   Anna Cantu 05/05/2019, 9:52 AM

## 2019-05-05 NOTE — H&P (Signed)
[facility] History and Physical  Reason for Referral: Hemorrhoids Referring Physician:  Dr. Moshe Cipro      Chief Complaint    Hemorrhoids      Anna Cantu is a 53 y.o. female.  HPI: Anna Cantu is a very sweet 53 yo who has had issues with hemorrhoids for years. She has had a colonoscopy in 2015 for hematochezia that demonstrated some polyps and internal hemorrhoids but no other signs of bleeding, and she says that she was offered banding but she did not pursue this at that time. She says that in addition to some occasional bleeding she has issues with getting clean, having some itching, and at times she feels a tearing/ sharp sensation when she is having a bowel movement. She also says that she has to do maneuvers and essentially disimpact herself with her fingers because she feels like she has stool balls that do not come out. She reports a history of a episiotomy versus tear with her son, and that she has had hemorrhoids and issues with her anus since that time. I asked about pain with sex, but she does not recall and it has been a while since having sexual intercourse. She denied any urinary leakage.   At this time, she is tired of the discomfort and bleeding. She says that the sharp pain with BM is the worse part, and that she tries to keep her stools soft and regular. She denies being on Linzess now but it looks like she was on it in 2015.       Past Medical History:  Diagnosis Date  . Chronic back pain   . Gastritis   . Hemorrhoids   . Nicotine addiction   . Obesity   . Ovarian cyst          Past Surgical History:  Procedure Laterality Date  . btl and endometrial abaltion    . CHOLECYSTECTOMY  2007   Dr. Aviva Signs   . COLONOSCOPY   08/19/2008   Dr. Madolyn Frieze canal hemorrhoids.  Diminutive rectal polyp  status post cold biopsy and removal.  The remainder of rectal mucosa and colon was normal. Path: polypoid.   Marland Kitchen  COLONOSCOPY WITH ESOPHAGOGASTRODUODENOSCOPY (EGD) N/A 07/08/2013   RMR: Rectal and colonic polyps-one tubular adenoma. colonic diverticulosis. Hemorroids-likely source of hematochezia.  NEXT TCS IN 7 YEARS. Small hiatal hernia . Antral erosions;nonbleeding duodenal AVM; status post gastric biopsy with severe H.pylori  . ESOPHAGOGASTRODUODENOSCOPY   08/19/2008   Dr. Volney American esophageal erosions, consistent with erosive reflux esophagitis/small HH/The remainder of her upper GI tract appeared normal  . left knee surgey- arthroscopy June 2011     Dr. Percell Miller   . right carpal tunnel release  1991  . Right Inguinal hernia repair  1982  . right knee surgery  1993  . TUBAL LIGATION           Family History  Problem Relation Age of Onset  . Hypertension Mother   . Stroke Mother   . Hypertension Father   . Breast cancer Paternal Grandmother   . Colon cancer Neg Hx     Social History   Tobacco Use  . Smoking status: Former Smoker    Packs/day: 0.50    Types: Cigarettes  . Smokeless tobacco: Never Used  . Tobacco comment: quit Mar 03, 2013   Substance Use Topics  . Alcohol use: Yes    Comment: occasionally  . Drug use: No    Medications: I have reviewed  the patient's current medications.      Allergies as of 05/04/2019      Reactions   Ciprofloxacin    ?rash related to cipro or flagyl vs other reason   Flagyl [metronidazole]    ?rash to cipro or flagyl vs other reason   Amoxicillin Itching, Rash         Medication List       Accurate as of May 04, 2019 11:59 PM. If you have any questions, ask your nurse or doctor.        acetaminophen 500 MG tablet Commonly known as: TYLENOL Take 1 tablet (500 mg total) by mouth every 6 (six) hours as needed.   butalbital-acetaminophen-caffeine 50-325-40 MG tablet Commonly known as: FIORICET Take one tablet by mouth two times daily , as needed, for migraine headache   diazepam 5 MG  tablet Commonly known as: VALIUM Take on tablet by mouth at bedtime for neck spasm   diazepam 5 MG tablet Commonly known as: VALIUM Take one tablet at bedtime as needed, for neck and back  spasm Start taking on: May 06, 2019   diclofenac sodium 1 % Gel Commonly known as: VOLTAREN Apply twice daily to affected areas   ELDERBERRY PO Take 1 Dose by mouth daily.   HYDROcodone-acetaminophen 7.5-325 MG tablet Commonly known as: NORCO Take one tablet by mouth once daily for chronic back pain   HYDROcodone-acetaminophen 7.5-325 MG tablet Commonly known as: Norco Take one tablet by mouth once daily for chronic back pain Start taking on: May 22, 2019   HYDROcodone-acetaminophen 7.5-325 MG tablet Commonly known as: Norco Take one tablet by mouth once daily for chronic back pain Start taking on: June 21, 2019   hyoscyamine 0.375 MG 12 hr tablet Commonly known as: Levbid Take 1 tablet (0.375 mg total) by mouth every 12 (twelve) hours as needed.   ibuprofen 800 MG tablet Commonly known as: ADVIL Take 1 tablet (800 mg total) by mouth every 8 (eight) hours as needed. Max 3 tabs a week   montelukast 10 MG tablet Commonly known as: SINGULAIR Take 1 tablet (10 mg total) by mouth at bedtime.   multivitamin with minerals Tabs tablet Take 1 tablet by mouth daily.   ondansetron 4 MG tablet Commonly known as: ZOFRAN Take 1 tablet (4 mg total) by mouth every 8 (eight) hours as needed for nausea or vomiting.   predniSONE 10 MG tablet Commonly known as: DELTASONE Take 1 tablet (10 mg total) by mouth 2 (two) times daily with a meal.   UNABLE TO FIND Tumeric 2000mg  daily   VITAMIN A PO Take 1 Dose by mouth daily.   vitamin B-12 500 MCG tablet Commonly known as: CYANOCOBALAMIN Take 500 mcg by mouth daily.   vitamin C 1000 MG tablet Take 1,000 mg by mouth daily.   vitamin E 600 UNIT capsule Take 600 Units by mouth daily.   Zinc 15 MG Caps Take 1  capsule by mouth daily.        ROS:  A comprehensive review of systems was negative except for: Gastrointestinal: positive for constipation and anal pain/ tearing, some itching and hygiene/ cleaning issues  Blood pressure 126/80, pulse 83, temperature 98.5 F (36.9 C), temperature source Oral, resp. rate 16, height 5\' 4"  (1.626 m), weight 167 lb (75.8 kg), last menstrual period 07/01/2013, SpO2 98 %. Physical Exam Vitals signs reviewed.  Constitutional:      Appearance: Normal appearance.  HENT:     Head: Normocephalic and atraumatic.  Nose: Nose normal.     Mouth/Throat:     Mouth: Mucous membranes are moist.  Eyes:     Extraocular Movements: Extraocular movements intact.     Pupils: Pupils are equal, round, and reactive to light.  Neck:     Musculoskeletal: Normal range of motion. No neck rigidity.  Cardiovascular:     Rate and Rhythm: Normal rate and regular rhythm.  Pulmonary:     Effort: Pulmonary effort is normal.     Breath sounds: Normal breath sounds.  Abdominal:     General: There is no distension.     Palpations: Abdomen is soft.     Tenderness: There is no abdominal tenderness.  Genitourinary:    Rectum: Tenderness, anal fissure, external hemorrhoid and internal hemorrhoid present. Normal anal tone.     Comments: Right posterior and left lateral hemorrhoidal columns, right posterior column with irritated looking mucosa but also could have a verrucous appearance, posterior fissure, chronic appearing between, patulous rectal vault anterior and posterior Musculoskeletal: Normal range of motion.        General: No swelling.  Skin:    General: Skin is warm and dry.  Neurological:     General: No focal deficit present.     Mental Status: She is alert and oriented to person, place, and time.  Psychiatric:        Mood and Affect: Mood normal.        Behavior: Behavior normal.        Thought Content: Thought content normal.        Judgment: Judgment normal.      Results: PAP cytology- no high risk HPV   Assessment & Plan:  NAKAYLAH TIRPAK is a 53 y.o. 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  She has multiple things going on and we discussed a staged/ stepwise approach. I think some of her pain is likely from the fissure but her hygiene issues could be from the hemorrhoids as well as the bleeding.  She also has possible irritation versus verrucous type area on the right posterior hemorrhoid column.  Her fissure will likely get worse with the hemorrhoid surgery but she needs hemorrhoid surgery now especially given the concern for the verrucous area on there right posterior hemorrhoid.   -Fissure treatment with diltiazem 2%/ lidocaine 5% QID for the next month -Hemorrhoidectomy for extensive hemorrhoids, grade IV hemorrhoids in January 2021  -Discussed possibility of the verrucous lesion and her recent PAP with no high risk HPV which is reassuring but discussed sending the hemorrhoid columns to pathology as I always do to ensure no suspicious areas and just irritation.   Hemorrhoid surgery for external hemorrhoids is very painful. The pain and discomfort that the patient is having currently will be magnified after the surgery for at least 2-3 weeks.  The patient will have feelings of constant pressure and pain in the area from the swelling and removal of the anoderm (skin around the anus). The internal hemorrhoids are not painful to remove because the same nerves are not involved, and the sensation is different, but removal of any external hemorrhoids will cause significant discomfort. They will need at least 4-6 weeks to recover from the surgery, and should not expect to be able to feel back to "normal for 6-8 weeks."    The risk of hemorrhoid  surgery include bleeding, risk of  infection although rare, and the risk of narrowing the anal canal if too much tissue is removed. Given this risk, it is likely that only the 2 largest hemorrhoid columns would be removed during the initial surgery.  We have also discussed the risk of incontinence after surgery if the muscles were injured, and although this is rare that it can happen and is another reason to limit the amount of hemorrhoids removed.     -May need referral to physical therapy for pelvic floor training in the long run due to her rectocele type symptoms   All questions were answered to the satisfaction of the patient.   Virl Cagey 05/05/2019, 9:52 AM

## 2019-05-07 ENCOUNTER — Ambulatory Visit (HOSPITAL_COMMUNITY)
Admission: RE | Admit: 2019-05-07 | Discharge: 2019-05-07 | Disposition: A | Discharge status: Home / Self Care | Payer: 59 | Source: Ambulatory Visit | Attending: Family Medicine | Admitting: Family Medicine

## 2019-05-07 ENCOUNTER — Other Ambulatory Visit: Payer: Self-pay

## 2019-05-07 DIAGNOSIS — Z1231 Encounter for screening mammogram for malignant neoplasm of breast: Secondary | ICD-10-CM | POA: Diagnosis present

## 2019-05-19 ENCOUNTER — Encounter: Payer: Self-pay | Admitting: Family Medicine

## 2019-05-19 NOTE — Assessment & Plan Note (Signed)
Uncontrolled, n eeds to commit to daily med, and short course of med for acute fare also prescribed

## 2019-05-19 NOTE — Assessment & Plan Note (Signed)
Increased frequency nd severity currently, fioricet prescribed

## 2019-05-19 NOTE — Assessment & Plan Note (Signed)
Associated wit acute gI distress, treat symptoamtically and encourage frequent small meals

## 2019-06-09 ENCOUNTER — Other Ambulatory Visit: Payer: Self-pay

## 2019-06-09 ENCOUNTER — Other Ambulatory Visit (HOSPITAL_COMMUNITY)
Admission: RE | Admit: 2019-06-09 | Discharge: 2019-06-09 | Disposition: A | Discharge status: Home / Self Care | Payer: 59 | Source: Ambulatory Visit | Attending: General Surgery | Admitting: General Surgery

## 2019-06-09 ENCOUNTER — Encounter (HOSPITAL_COMMUNITY)
Admission: RE | Admit: 2019-06-09 | Discharge: 2019-06-09 | Disposition: A | Discharge status: Home / Self Care | Payer: 59 | Source: Ambulatory Visit | Attending: General Surgery | Admitting: General Surgery

## 2019-06-09 DIAGNOSIS — Z20822 Contact with and (suspected) exposure to covid-19: Secondary | ICD-10-CM | POA: Insufficient documentation

## 2019-06-09 DIAGNOSIS — Z01812 Encounter for preprocedural laboratory examination: Secondary | ICD-10-CM | POA: Diagnosis present

## 2019-06-09 LAB — SARS CORONAVIRUS 2 (TAT 6-24 HRS): SARS Coronavirus 2: NEGATIVE

## 2019-06-11 ENCOUNTER — Encounter (HOSPITAL_COMMUNITY)
Admission: RE | Disposition: A | Discharge status: Home / Self Care | Payer: Self-pay | Source: Home / Self Care | Attending: General Surgery

## 2019-06-11 ENCOUNTER — Ambulatory Visit (HOSPITAL_COMMUNITY): Payer: 59 | Admitting: Anesthesiology

## 2019-06-11 ENCOUNTER — Ambulatory Visit (HOSPITAL_COMMUNITY)
Admission: RE | Admit: 2019-06-11 | Discharge: 2019-06-11 | Disposition: A | Discharge status: Home / Self Care | Payer: 59 | Attending: General Surgery | Admitting: General Surgery

## 2019-06-11 ENCOUNTER — Encounter (HOSPITAL_COMMUNITY): Payer: Self-pay | Admitting: General Surgery

## 2019-06-11 DIAGNOSIS — Z7952 Long term (current) use of systemic steroids: Secondary | ICD-10-CM | POA: Diagnosis not present

## 2019-06-11 DIAGNOSIS — G8929 Other chronic pain: Secondary | ICD-10-CM | POA: Diagnosis not present

## 2019-06-11 DIAGNOSIS — M549 Dorsalgia, unspecified: Secondary | ICD-10-CM | POA: Diagnosis not present

## 2019-06-11 DIAGNOSIS — K219 Gastro-esophageal reflux disease without esophagitis: Secondary | ICD-10-CM | POA: Insufficient documentation

## 2019-06-11 DIAGNOSIS — Z79899 Other long term (current) drug therapy: Secondary | ICD-10-CM | POA: Diagnosis not present

## 2019-06-11 DIAGNOSIS — K643 Fourth degree hemorrhoids: Secondary | ICD-10-CM | POA: Diagnosis not present

## 2019-06-11 DIAGNOSIS — Z79891 Long term (current) use of opiate analgesic: Secondary | ICD-10-CM | POA: Diagnosis not present

## 2019-06-11 DIAGNOSIS — E669 Obesity, unspecified: Secondary | ICD-10-CM | POA: Diagnosis not present

## 2019-06-11 DIAGNOSIS — K602 Anal fissure, unspecified: Secondary | ICD-10-CM | POA: Diagnosis present

## 2019-06-11 HISTORY — PX: HEMORRHOID SURGERY: SHX153

## 2019-06-11 SURGERY — HEMORRHOIDECTOMY
Anesthesia: General | Site: Rectum

## 2019-06-11 MED ORDER — FENTANYL CITRATE (PF) 100 MCG/2ML IJ SOLN
INTRAMUSCULAR | Status: DC | PRN
  Administered 2019-06-11: 25 ug via INTRAVENOUS
  Administered 2019-06-11: 50 ug via INTRAVENOUS

## 2019-06-11 MED ORDER — LACTATED RINGERS IV SOLN: INTRAVENOUS | Status: DC

## 2019-06-11 MED ORDER — OXYCODONE HCL 5 MG PO TABS: 5.0000 mg | ORAL_TABLET | ORAL | 0 refills | Status: DC | PRN

## 2019-06-11 MED ORDER — PROMETHAZINE HCL 25 MG/ML IJ SOLN: 6.2500 mg | INTRAMUSCULAR | Status: DC | PRN

## 2019-06-11 MED ORDER — ONDANSETRON HCL 4 MG/2ML IJ SOLN
INTRAMUSCULAR | Status: AC
  Filled 2019-06-11: qty 2

## 2019-06-11 MED ORDER — PROPOFOL 10 MG/ML IV BOLUS
INTRAVENOUS | Status: DC | PRN
  Administered 2019-06-11: 180 mg via INTRAVENOUS

## 2019-06-11 MED ORDER — LIDOCAINE HCL (CARDIAC) PF 50 MG/5ML IV SOSY
PREFILLED_SYRINGE | INTRAVENOUS | Status: DC | PRN
  Administered 2019-06-11: 60 mg via INTRAVENOUS

## 2019-06-11 MED ORDER — PROPOFOL 10 MG/ML IV BOLUS
INTRAVENOUS | Status: AC
  Filled 2019-06-11: qty 40

## 2019-06-11 MED ORDER — BUPIVACAINE LIPOSOME 1.3 % IJ SUSP
INTRAMUSCULAR | Status: AC
  Filled 2019-06-11: qty 20

## 2019-06-11 MED ORDER — MIDAZOLAM HCL 2 MG/2ML IJ SOLN: 0.5000 mg | Freq: Once | INTRAMUSCULAR | Status: DC | PRN

## 2019-06-11 MED ORDER — ONDANSETRON HCL 4 MG/2ML IJ SOLN
INTRAMUSCULAR | Status: DC | PRN
  Administered 2019-06-11: 4 mg via INTRAVENOUS

## 2019-06-11 MED ORDER — DOCUSATE SODIUM 100 MG PO CAPS: 100.0000 mg | ORAL_CAPSULE | Freq: Two times a day (BID) | ORAL | 2 refills | Status: AC

## 2019-06-11 MED ORDER — MIDAZOLAM HCL 5 MG/5ML IJ SOLN
INTRAMUSCULAR | Status: DC | PRN
  Administered 2019-06-11: 2 mg via INTRAVENOUS

## 2019-06-11 MED ORDER — MIDAZOLAM HCL 2 MG/2ML IJ SOLN
INTRAMUSCULAR | Status: AC
  Filled 2019-06-11: qty 2

## 2019-06-11 MED ORDER — SUCCINYLCHOLINE CHLORIDE 200 MG/10ML IV SOSY
PREFILLED_SYRINGE | INTRAVENOUS | Status: AC
  Filled 2019-06-11: qty 20

## 2019-06-11 MED ORDER — CHLORHEXIDINE GLUCONATE CLOTH 2 % EX PADS: 6.0000 | MEDICATED_PAD | Freq: Once | CUTANEOUS | Status: DC

## 2019-06-11 MED ORDER — HYDROMORPHONE HCL 1 MG/ML IJ SOLN
INTRAMUSCULAR | Status: AC
  Filled 2019-06-11: qty 0.5

## 2019-06-11 MED ORDER — HYDROMORPHONE HCL 1 MG/ML IJ SOLN
0.2500 mg | INTRAMUSCULAR | Status: DC | PRN
  Administered 2019-06-11: 0.5 mg via INTRAVENOUS

## 2019-06-11 MED ORDER — SODIUM CHLORIDE 0.9 % IR SOLN
Status: DC | PRN
  Administered 2019-06-11: 1000 mL

## 2019-06-11 MED ORDER — LIDOCAINE 2% (20 MG/ML) 5 ML SYRINGE
INTRAMUSCULAR | Status: AC
  Filled 2019-06-11: qty 10

## 2019-06-11 MED ORDER — HYDROCODONE-ACETAMINOPHEN 7.5-325 MG PO TABS
1.0000 | ORAL_TABLET | Freq: Once | ORAL | Status: AC | PRN
  Administered 2019-06-11: 1 via ORAL
  Filled 2019-06-11: qty 1

## 2019-06-11 MED ORDER — LIDOCAINE VISCOUS HCL 2 % MT SOLN
OROMUCOSAL | Status: AC
  Filled 2019-06-11: qty 15

## 2019-06-11 MED ORDER — LIDOCAINE VISCOUS HCL 2 % MT SOLN
OROMUCOSAL | Status: DC | PRN
  Administered 2019-06-11: 1

## 2019-06-11 MED ORDER — BUPIVACAINE LIPOSOME 1.3 % IJ SUSP
INTRAMUSCULAR | Status: DC | PRN
  Administered 2019-06-11: 20 mL

## 2019-06-11 MED ORDER — FENTANYL CITRATE (PF) 250 MCG/5ML IJ SOLN
INTRAMUSCULAR | Status: AC
  Filled 2019-06-11: qty 5

## 2019-06-11 SURGICAL SUPPLY — 31 items
CLOTH BEACON ORANGE TIMEOUT ST (SAFETY) ×3 IMPLANT
COVER LIGHT HANDLE STERIS (MISCELLANEOUS) ×6 IMPLANT
COVER WAND RF STERILE (DRAPES) ×3 IMPLANT
DRAPE HALF SHEET 40X57 (DRAPES) ×6 IMPLANT
ELECT REM PT RETURN 9FT ADLT (ELECTROSURGICAL) ×3
ELECTRODE REM PT RTRN 9FT ADLT (ELECTROSURGICAL) ×1 IMPLANT
GAUZE SPONGE 4X4 12PLY STRL (GAUZE/BANDAGES/DRESSINGS) ×3 IMPLANT
GLOVE BIO SURGEON STRL SZ 6.5 (GLOVE) ×4 IMPLANT
GLOVE BIO SURGEON STRL SZ7 (GLOVE) ×2 IMPLANT
GLOVE BIO SURGEONS STRL SZ 6.5 (GLOVE) ×2
GLOVE BIOGEL PI IND STRL 6.5 (GLOVE) ×1 IMPLANT
GLOVE BIOGEL PI IND STRL 7.0 (GLOVE) ×2 IMPLANT
GLOVE BIOGEL PI INDICATOR 6.5 (GLOVE) ×2
GLOVE BIOGEL PI INDICATOR 7.0 (GLOVE) ×4
GOWN STRL REUS W/TWL LRG LVL3 (GOWN DISPOSABLE) ×6 IMPLANT
HEMOSTAT SURGICEL 4X8 (HEMOSTASIS) ×3 IMPLANT
KIT TURNOVER CYSTO (KITS) ×3 IMPLANT
MANIFOLD NEPTUNE II (INSTRUMENTS) ×3 IMPLANT
NDL HYPO 18GX1.5 BLUNT FILL (NEEDLE) ×1 IMPLANT
NDL HYPO 21X1.5 SAFETY (NEEDLE) ×1 IMPLANT
NEEDLE HYPO 18GX1.5 BLUNT FILL (NEEDLE) ×3 IMPLANT
NEEDLE HYPO 21X1.5 SAFETY (NEEDLE) ×3 IMPLANT
NS IRRIG 1000ML POUR BTL (IV SOLUTION) ×3 IMPLANT
PACK PERI GYN (CUSTOM PROCEDURE TRAY) ×3 IMPLANT
PAD ARMBOARD 7.5X6 YLW CONV (MISCELLANEOUS) ×3 IMPLANT
SET BASIN LINEN APH (SET/KITS/TRAYS/PACK) ×3 IMPLANT
SPONGE SURGIFOAM ABS GEL 100 (HEMOSTASIS) ×3 IMPLANT
SURGILUBE 2OZ TUBE FLIPTOP (MISCELLANEOUS) ×3 IMPLANT
SUT SILK 0 FSL (SUTURE) ×3 IMPLANT
SYR 20ML LL LF (SYRINGE) ×5 IMPLANT
SYR BULB IRRIGATION 50ML (SYRINGE) ×3 IMPLANT

## 2019-06-11 NOTE — Transfer of Care (Signed)
Immediate Anesthesia Transfer of Care Note  Patient: Anna Cantu  Procedure(s) Performed: HEMORRHOIDECTOMY EXTENSIVE (N/A Rectum)  Patient Location: PACU  Anesthesia Type:General  Level of Consciousness: awake  Airway & Oxygen Therapy: Patient Spontanous Breathing  Post-op Assessment: Report given to RN and Post -op Vital signs reviewed and stable  Post vital signs: Reviewed  Last Vitals:  Vitals Value Taken Time  BP 124/107 06/11/19 0820  Temp    Pulse 82 06/11/19 0822  Resp 19 06/11/19 0822  SpO2 97 % 06/11/19 0822  Vitals shown include unvalidated device data.  Last Pain:  Vitals:   06/11/19 0719  PainSc: 0-No pain         Complications: No apparent anesthesia complications

## 2019-06-11 NOTE — Anesthesia Preprocedure Evaluation (Signed)
Anesthesia Evaluation  Patient identified by MRN, date of birth, ID band Patient awake    Reviewed: Allergy & Precautions, NPO status , Patient's Chart, lab work & pertinent test results  Airway Mallampati: II  TM Distance: >3 FB Neck ROM: Full    Dental no notable dental hx. (+) Teeth Intact   Pulmonary Current Smoker and Patient abstained from smoking., former smoker,  Reports cigar use without inhaling -none today   Pulmonary exam normal breath sounds clear to auscultation       Cardiovascular Exercise Tolerance: Good negative cardio ROS Normal cardiovascular examI Rhythm:Regular Rate:Normal     Neuro/Psych  Headaches,  Neuromuscular disease negative psych ROS   GI/Hepatic Neg liver ROS, GERD  Medicated and Controlled,  Endo/Other  negative endocrine ROS  Renal/GU negative Renal ROS  negative genitourinary   Musculoskeletal negative musculoskeletal ROS (+)   Abdominal   Peds negative pediatric ROS (+)  Hematology negative hematology ROS (+)   Anesthesia Other Findings   Reproductive/Obstetrics negative OB ROS                             Anesthesia Physical Anesthesia Plan  ASA: II  Anesthesia Plan: General   Post-op Pain Management:    Induction: Intravenous  PONV Risk Score and Plan: 3 and Midazolam, Ondansetron, Dexamethasone and Treatment may vary due to age or medical condition  Airway Management Planned: LMA  Additional Equipment:   Intra-op Plan:   Post-operative Plan:   Informed Consent: I have reviewed the patients History and Physical, chart, labs and discussed the procedure including the risks, benefits and alternatives for the proposed anesthesia with the patient or authorized representative who has indicated his/her understanding and acceptance.     Dental advisory given  Plan Discussed with: CRNA  Anesthesia Plan Comments: (Plan Full PPE use  Plan  GA(LMA)with GETA as needed d/w pt -WTP with same after Q&A)        Anesthesia Quick Evaluation

## 2019-06-11 NOTE — Anesthesia Postprocedure Evaluation (Signed)
Anesthesia Post Note  Patient: Anna Cantu  Procedure(s) Performed: HEMORRHOIDECTOMY EXTENSIVE (N/A Rectum)  Patient location during evaluation: PACU Anesthesia Type: General Level of consciousness: awake and alert and patient cooperative Pain management: satisfactory to patient Vital Signs Assessment: post-procedure vital signs reviewed and stable Respiratory status: spontaneous breathing Cardiovascular status: stable Postop Assessment: no apparent nausea or vomiting Anesthetic complications: no     Last Vitals:  Vitals:   06/11/19 0809 06/11/19 0912  BP: 124/64 138/71  Pulse: 77 71  Resp: 18 18  Temp: 37.2 C   SpO2: 100% 97%    Last Pain:  Vitals:   06/11/19 0945  PainSc: 7                  Ell Tiso

## 2019-06-11 NOTE — Op Note (Signed)
[facility] Operative Note  06/11/19  Preoperative Diagnosis:  Grade IV hemorrhoids; anal fissure    Postoperative Diagnosis: Grade IV hemorrhoids (right posterior and left lateral columns)    Procedure(s) Performed: Extensive hemorrhoidectomy right posterior and left lateral column    Surgeon: Lanell Matar. Camary Haw, MD   Assistants: No qualified resident was available    Anesthesia: General endotracheal   Anesthesiologist: Lenice Llamas, MD    Specimens: Right posterior hemorrhoid, and left lateral    Estimated Blood Loss: Minimal   Blood Replacement: None    Complications: None   Wound Class: Dirty    Operative Indications: Anna Cantu is a very sweet 54 yo with hemorrhoids and an anal fissure. I prescribed her diltiazem /lidocaine for the fissure and recommended holding off on hemorrhoidectomy until the fissure improved.  We discussed the risk and benefits of hemorrhoidectomy including but not limited to bleeding, infection, injury to the sphincter complex, anal stenosis, and she has opted to proceed.   Findings: Healed anal fissure, right posterior large grade IV hemorrhoid, left lateral large grade IV hemorrhoid    Procedure: The patient was taken to the operating room and placed supine. General endotracheal anesthesia was induced. Intravenous antibiotics were not administered per protocol.  She was then placed in the lithotomy position with all pressure points padded. The anal region and perineum were prepped and draped in the normal fashion.    A digital exam was performed and her tone was relaxed due to the anesthesia. There were two obvious columns prolapsing out in the right posterior and left lateral (more posterior lateral) positions. There was no significant right anterior column noted.  The prior irritation of the right posterior column was no longer appreciated.  Anoscopy was performed. The right posterior hemorrhoidal column was grasped and  elevated away from the sphincter complex. The electrocautery was used to incise the perianal skin and the ligasure was used to ligate the hemorrhoidal column in its entirety.  The same was done for the left lateral column ensuring an adequate skin bridge between the columns.  Hemostasis was confirmed. Xparel was placed around the anal region.  A piece of surgicel wrapped around surgifoam was placed in the anus and tagged with a silk suture for removal.   All counts were correct at the end of the case. The patient was awakened from anesthesia and extubated without complication.  The patient went to the PACU in stable condition.   Curlene Labrum, MD [facility] [address] [place], [place] [address] [phone] (office)

## 2019-06-11 NOTE — Discharge Instructions (Signed)
Common Complaints: Pain and swelling in the anal region. Feeling as if you need to have a BM due to pressure sensation.  Medication Instructions:  Take: Tylenol 1000mg  @ 6am, 12noon, 6pm, 71midnight (Do not exceed 4000mg  of tylenol a day). Take Roxicodone for breakthrough pain every 4 hours.  Do not take roxicodone with your chronic Norco.  Do not take any aspirin or NSAIDs, ibuprofen for 5 days.  After 5 days start using Ibuprofen for pain. Take Ibuprofen 600- 800mg  @ 9am, 3pm, 9pm, 3am (Do not exceed 3600mg  of ibuprofen a day). Take ibuprofen with food.    Diet/ Activity: Diet as tolerated.  Rest and listen to your body, but do not remain in bed all day.  Walk everyday for at least 15-20 minutes. Deep cough and move around every 1-2 hours in the first few days after surgery.  Do not lift > 10 lbs, perform excessive bending, pushing, pulling, squatting for 1-2 weeks after surgery.  Please keep the area clean and dry and take Sitz baths (swallow warm water baths) for comfort and after bowel movements.  Please keep your stools soft and take fiber daily (metamucil) and colace (over the counter) to help prevent constipation.  If you have not had a BM in 2 days, take miralax and if you still have not had a BM call Dr. Lua Haw.   Expect some bleeding following the hemorrhoid surgery and significant pain.  Go to the ED with extensive bleeding, fevers, or chills.    Pain Expectations and Narcotics: -After surgery you will have pain associated with your incisions and this is normal. The pain is muscular and nerve pain, and will get better with time. -You are encouraged and expected to take non narcotic medications like tylenol and ibuprofen (when able) to treat pain as multiple modalities can aid with pain treatment. -Narcotics are only used when pain is severe or there is breakthrough pain. -You are not expected to have a pain score of 0 after surgery, as we cannot prevent pain. A pain score of  3-4 that allows you to be functional, move, walk, and tolerate some activity is the goal. The pain will continue to improve over the days after surgery and is dependent on your surgery. -Due to [place] law, we are only able to give a certain amount of pain medication to treat post operative pain, and we only give additional narcotics on a patient by patient basis.  -For most laparoscopic surgery, studies have shown that the majority of patients only need 10-15 narcotic pills, and for open surgeries most patients only need 15-20.   -Having appropriate expectations of pain and knowledge of pain management with non narcotics is important as we do not want anyone to become addicted to narcotic pain medication.  -Using ice packs in the first 48 hours and heating pads after 48 hours, wearing an abdominal binder (when recommended), and using over the counter medications are all ways to help with pain management.   -Simple acts like meditation and mindfulness practices after surgery can also help with pain control and research has proven the benefit of these practices.  Contact Information: If you have questions or concerns, please call our office, [phone], Monday- Thursday 8AM-5PM and Friday 8AM-12Noon.  If it is after hours or on the weekend, please call [facility], [phone], and ask to speak to the surgeon on call for Dr. Chisom Haw at [facility].    Surgical Procedures for Hemorrhoids, Care After This sheet gives you  information about how to care for yourself after your procedure. Your health care provider may also give you more specific instructions. If you have problems or questions, contact your health care provider. What can I expect after the procedure? After the procedure, it is common to have:  Rectal pain.  Pain when you are having a bowel movement.  Slight rectal bleeding. This is more likely to happen with the first bowel movement after surgery. Follow these instructions at  home: Medicines  Take over-the-counter and prescription medicines only as told by your health care provider.  If you were prescribed an antibiotic medicine, use it as told by your health care provider. Do not stop using the antibiotic even if your condition improves.  Ask your health care provider if the medicine prescribed to you requires you to avoid driving or using heavy machinery.  Use a stool softener or a bulk laxative as told by your health care provider. Eating and drinking  Follow instructions from your health care provider about what to eat or drink after your procedure.  You may need to take actions to prevent or treat constipation, such as: ? Drink enough fluid to keep your urine pale yellow. ? Take over-the-counter or prescription medicines. ? Eat foods that are high in fiber, such as beans, whole grains, and fresh fruits and vegetables. ? Limit foods that are high in fat and processed sugars, such as fried or sweet foods. Activity   Rest as told by your health care provider.  Avoid sitting for a long time without moving. Get up to take short walks every 1-2 hours. This is important to improve blood flow and breathing. Ask for help if you feel weak or unsteady.  Return to your normal activities as told by your health care provider. Ask your health care provider what activities are safe for you.  Do not lift anything that is heavier than 10 lb (4.5 kg), or the limit that you are told, until your health care provider says that it is safe.  Do not strain to have a bowel movement.  Do not spend a long time sitting on the toilet. General instructions   Take warm sitz baths for 15-20 minutes, 2-3 times a day to relieve soreness or itching and to keep the rectal area clean.  Apply ice packs to the area to reduce swelling and pain.  Do not drive for 24 hours if you were given a sedative during your procedure.  Keep all follow-up visits as told by your health care  provider. This is important. Contact a health care provider if:  Your pain medicine is not helping.  You have a fever or chills.  You have bad smelling drainage.  You have a lot of swelling.  You become constipated.  You have trouble passing urine. Get help right away if:  You have very bad rectal pain.  You have heavy bleeding from your rectum. Summary  After the procedure, it is common to have pain and slight rectal bleeding.  Take warm sitz baths for 15-20 minutes, 2-3 times a day to relieve soreness or itching and to keep the rectal area clean.  Avoid straining when having a bowel movement.  Eat foods that are high in fiber, such as beans, whole grains, and fresh fruits and vegetables.  Take over-the-counter and prescription medicines only as told by your health care provider. This information is not intended to replace advice given to you by your health care provider. Make sure you  discuss any questions you have with your health care provider. Document Revised: 10/28/2018 Document Reviewed: 03/31/2018 Elsevier Patient Education  2020 [employer].   How to Take a [employer] A sitz bath is a warm water bath that may be used to care for your rectum, genital area, or the area between your rectum and genitals (perineum). For a sitz bath, the water only comes up to your hips and covers your buttocks. A sitz bath may done at home in a bathtub or with a portable sitz bath that fits over the toilet. Your health care provider may recommend a sitz bath to help:  Relieve pain and discomfort after delivering a baby.  Relieve pain and itching from hemorrhoids or anal fissures.  Relieve pain after certain surgeries.  Relax muscles that are sore or tight. How to take a sitz bath Take 3-4 sitz baths a day, or as many as told by your health care provider. Bathtub sitz bath To take a sitz bath in a bathtub: 1. Partially fill a bathtub with warm water. The water should be deep  enough to cover your hips and buttocks when you are sitting in the tub. 2. If your health care provider told you to put medicine in the water, follow his or her instructions. 3. Sit in the water. 4. Open the tub drain a little, and leave it open during your bath. 5. Turn on the warm water again, enough to replace the water that is draining out. Keep the water running throughout your bath. This helps keep the water at the right level and the right temperature. 6. Soak in the water for 15-20 minutes, or as long as told by your health care provider. 7. When you are done, be careful when you stand up. You may feel dizzy. 8. After the sitz bath, pat yourself dry. Do not rub your skin to dry it.  Over-the-toilet sitz bath To take a sitz bath with an over-the-toilet basin: 1. Follow the manufacturer's instructions. 2. Fill the basin with warm water. 3. If your health care provider told you to put medicine in the water, follow his or her instructions. 4. Sit on the seat. Make sure the water covers your buttocks and perineum. 5. Soak in the water for 15-20 minutes, or as long as told by your health care provider. 6. After the sitz bath, pat yourself dry. Do not rub your skin to dry it. 7. Clean and dry the basin between uses. 8. Discard the basin if it cracks, or according to the manufacturer's instructions. Contact a health care provider if:  Your symptoms get worse. Do not continue with sitz baths if your symptoms get worse.  You have new symptoms. If this happens, do not continue with sitz baths until you talk with your health care provider. Summary  A sitz bath is a warm water bath in which the water only comes up to your hips and covers your buttocks.  A sitz bath may help relieve itching, relieve pain, and relax muscles that are sore or tight in the lower part of your body, including your genital area.  Take 3-4 sitz baths a day, or as many as told by your health care provider. Soak in the  water for 15-20 minutes.  Do not continue with sitz baths if your symptoms get worse. This information is not intended to replace advice given to you by your health care provider. Make sure you discuss any questions you have with your health care provider.  Document Revised: 10/12/2018 Document Reviewed: 05/15/2017 Elsevier Patient Education  2020 [place] After These instructions provide you with information about caring for yourself after your procedure. Your health care provider may also give you more specific instructions. Your treatment has been planned according to current medical practices, but problems sometimes occur. Call your health care provider if you have any problems or questions after your procedure. What can I expect after the procedure? After your procedure, you may:  Feel sleepy for several hours.  Feel clumsy and have poor balance for several hours.  Feel forgetful about what happened after the procedure.  Have poor judgment for several hours.  Feel nauseous or vomit.  Have a sore throat if you had a breathing tube during the procedure. Follow these instructions at home: For at least 24 hours after the procedure:      Have a responsible adult stay with you. It is important to have someone help care for you until you are awake and alert.  Rest as needed.  Do not: ? Participate in activities in which you could fall or become injured. ? Drive. ? Use heavy machinery. ? Drink alcohol. ? Take sleeping pills or medicines that cause drowsiness. ? Make important decisions or sign legal documents. ? Take care of children on your own. Eating and drinking  Follow the diet that is recommended by your health care provider.  If you vomit, drink water, juice, or soup when you can drink without vomiting.  Make sure you have little or no nausea before eating solid foods. General instructions  Take over-the-counter and  prescription medicines only as told by your health care provider.  If you have sleep apnea, surgery and certain medicines can increase your risk for breathing problems. Follow instructions from your health care provider about wearing your sleep device: ? Anytime you are sleeping, including during daytime naps. ? While taking prescription pain medicines, sleeping medicines, or medicines that make you drowsy.  If you smoke, do not smoke without supervision.  Keep all follow-up visits as told by your health care provider. This is important. Contact a health care provider if:  You keep feeling nauseous or you keep vomiting.  You feel light-headed.  You develop a rash.  You have a fever. Get help right away if:  You have trouble breathing. Summary  For several hours after your procedure, you may feel sleepy and have poor judgment.  Have a responsible adult stay with you for at least 24 hours or until you are awake and alert. This information is not intended to replace advice given to you by your health care provider. Make sure you discuss any questions you have with your health care provider. Document Revised: 08/11/2017 Document Reviewed: 09/03/2015 Elsevier Patient Education  [place].

## 2019-06-11 NOTE — Progress Notes (Signed)
[facility]  Notified son that surgery complete. Questions answered.  Curlene Labrum, MD [facility] [address] [place], [place] [address] [phone] (office)

## 2019-06-11 NOTE — Interval H&P Note (Signed)
History and Physical Interval Note:  06/11/2019 7:20 AM  Anna Cantu  has presented today for surgery, with the diagnosis of HEMORRHOIDS.  The various methods of treatment have been discussed with the patient and family. After consideration of risks, benefits and other options for treatment, the patient has consented to  Procedure(s): HEMORRHOIDECTOMY EXTENSIVE (N/A) as a surgical intervention.  The patient's history has been reviewed, patient examined, no change in status, stable for surgery.  I have reviewed the patient's chart and labs.  Questions were answered to the patient's satisfaction.    No changes. No questions. Virl Cagey

## 2019-06-11 NOTE — Anesthesia Procedure Notes (Signed)
Procedure Name: LMA Insertion Date/Time: 06/11/2019 7:37 AM Performed by: Ollen Bowl, CRNA Pre-anesthesia Checklist: Patient identified, Patient being monitored, Emergency Drugs available, Timeout performed and Suction available Patient Re-evaluated:Patient Re-evaluated prior to induction Oxygen Delivery Method: Circle System Utilized Preoxygenation: Pre-oxygenation with 100% oxygen Induction Type: IV induction Ventilation: Mask ventilation without difficulty LMA: LMA inserted LMA Size: 4.0 Number of attempts: 1 Placement Confirmation: positive ETCO2 and breath sounds checked- equal and bilateral

## 2019-06-14 LAB — SURGICAL PATHOLOGY

## 2019-06-15 ENCOUNTER — Encounter: Payer: Self-pay | Admitting: Family Medicine

## 2019-06-15 ENCOUNTER — Other Ambulatory Visit: Payer: Self-pay | Admitting: Family Medicine

## 2019-06-15 ENCOUNTER — Encounter: Payer: Self-pay | Admitting: General Surgery

## 2019-06-15 MED ORDER — ONDANSETRON HCL 4 MG PO TABS: 4.0000 mg | ORAL_TABLET | Freq: Three times a day (TID) | ORAL | 0 refills | Status: DC | PRN

## 2019-06-21 ENCOUNTER — Telehealth: Payer: Self-pay

## 2019-06-21 NOTE — Telephone Encounter (Signed)
Patient called regarding disability paperwork. As we were talking patient began to express concern regarding painful bowel movements and that she feels dizzy if she stands for long periods (like going to the grocery store). MD notified.

## 2019-06-23 ENCOUNTER — Encounter: Payer: Self-pay | Admitting: General Surgery

## 2019-06-29 ENCOUNTER — Encounter: Payer: Self-pay | Admitting: General Surgery

## 2019-06-29 ENCOUNTER — Ambulatory Visit (INDEPENDENT_AMBULATORY_CARE_PROVIDER_SITE_OTHER): Payer: Self-pay | Admitting: General Surgery

## 2019-06-29 ENCOUNTER — Other Ambulatory Visit: Payer: Self-pay

## 2019-06-29 VITALS — BP 120/86 | HR 86 | Temp 99.2°F | Resp 18 | Ht 64.0 in | Wt 167.0 lb

## 2019-06-29 DIAGNOSIS — K602 Anal fissure, unspecified: Secondary | ICD-10-CM

## 2019-06-29 DIAGNOSIS — K643 Fourth degree hemorrhoids: Secondary | ICD-10-CM

## 2019-06-29 MED ORDER — ONDANSETRON HCL 4 MG PO TABS: 4.0000 mg | ORAL_TABLET | Freq: Three times a day (TID) | ORAL | 1 refills | Status: DC | PRN

## 2019-06-29 MED ORDER — OXYCODONE HCL 5 MG PO TABS: 5.0000 mg | ORAL_TABLET | ORAL | 0 refills | Status: DC | PRN

## 2019-06-29 NOTE — Patient Instructions (Addendum)
Continue sitz baths. Diltiazem/ lidocaine ointment as needed to anal area for spasm.  [facility] says there are 2 refills available.  Take tylenol and ibuprofen as needed for pain and Roxicodone for breakthrough pain. Keep stools regular and soft.

## 2019-06-29 NOTE — Progress Notes (Signed)
[facility] Note   HPI:  54 y.o. Female presents to clinic for post-op follow-up evaluation after hemorrhoidectomy. She reports having pain with defecation that is sharp in nature. She is still needing to do sitz baths often and feels like she has some itching and irritation. She has some mucus that she notices and is worried about infection.   Review of Systems:  No fever or chills Sharp pain with BM Regular Bms that are soft/ loose All other review of systems: otherwise negative   Vital Signs:  BP 120/86 (BP Location: Left Arm, Patient Position: Sitting, Cuff Size: Normal)   Pulse 86   Temp 99.2 F (37.3 C) (Oral)   Resp 18   Ht 5\' 4"  (1.626 m)   Wt 167 lb (75.8 kg)   LMP 07/01/2013   SpO2 98%   BMI 28.67 kg/m    Physical Exam:  Physical Exam Vitals reviewed.  HENT:     Head: Normocephalic.  Cardiovascular:     Rate and Rhythm: Normal rate.  Pulmonary:     Effort: Pulmonary effort is normal.  Genitourinary:    Comments: Anal area with healing perianal skin, partially opened area from hemorrhoidectomy on left, evidence of some degree of fissure posteriorly (had one prior to surgery), no erythema, normal mucus drainage Skin:    General: Skin is warm.  Neurological:     General: No focal deficit present.     Mental Status: She is alert.     Assessment:  54 y.o. yo Female with sharp pain after defecation likely related to some degree of fissure and open hemorrhoidectomy sites. She is healing well.   Plan:  Continue sitz baths. Diltiazem/ lidocaine ointment as needed to anal area for spasm.  [facility] says there are 2 refills available.  Take tylenol and ibuprofen as needed for pain and Roxicodone for breakthrough pain. Keep stools regular and soft.  Will see her in a few weeks and given her pain and issues with defecation she will not be able to return to work at this time, but is working on trying to work from home.   Future  Appointments  Date Time Provider [place]  07/15/2019  8:00 AM Fayrene Helper, MD [facility]  07/27/2019  9:15 AM Virl Cagey, MD [facility] None    All of the above recommendations were discussed with the patient and all of patient's questions were answered to her expressed satisfaction.  Curlene Labrum, MD [facility] [address] [place], [place] [address] [phone] (office)

## 2019-07-06 ENCOUNTER — Encounter: Payer: Self-pay | Admitting: Family Medicine

## 2019-07-07 ENCOUNTER — Encounter: Payer: Self-pay | Admitting: General Surgery

## 2019-07-12 ENCOUNTER — Telehealth: Payer: Self-pay

## 2019-07-12 DIAGNOSIS — E6609 Other obesity due to excess calories: Secondary | ICD-10-CM

## 2019-07-12 DIAGNOSIS — Z13220 Encounter for screening for lipoid disorders: Secondary | ICD-10-CM

## 2019-07-12 DIAGNOSIS — E559 Vitamin D deficiency, unspecified: Secondary | ICD-10-CM

## 2019-07-12 DIAGNOSIS — Z6831 Body mass index (BMI) 31.0-31.9, adult: Secondary | ICD-10-CM

## 2019-07-12 NOTE — Telephone Encounter (Signed)
Patients pain management appt rescheduled to march and needs a refill to last until then. Last fill was 1/7. Received oxycodone after hemorrhoid procedure but states she can't take them and they are too strong

## 2019-07-12 NOTE — Telephone Encounter (Signed)
Labs ordered to be done before march appt

## 2019-07-13 ENCOUNTER — Other Ambulatory Visit: Payer: Self-pay | Admitting: Family Medicine

## 2019-07-13 ENCOUNTER — Encounter: Payer: Self-pay | Admitting: Family Medicine

## 2019-07-13 MED ORDER — HYDROCODONE-ACETAMINOPHEN 7.5-325 MG PO TABS: ORAL_TABLET | ORAL | 0 refills | Status: DC

## 2019-07-13 NOTE — Telephone Encounter (Signed)
Please let pt know that I have sent in 30 day supply of her medication, she needs to NOT take oxycodone prescribed by Surgeon with this, from reviewing her registry, she should no longer have any oxycodone, I am also entering she cannot tolerate oxycodone in her record

## 2019-07-13 NOTE — Telephone Encounter (Signed)
Patient aware.

## 2019-07-13 NOTE — Telephone Encounter (Signed)
I have taken care of this, Velna Hatchet has the message

## 2019-07-15 ENCOUNTER — Ambulatory Visit: Payer: 59 | Admitting: Family Medicine

## 2019-07-23 ENCOUNTER — Encounter: Payer: Self-pay | Admitting: Family Medicine

## 2019-07-26 ENCOUNTER — Other Ambulatory Visit: Payer: Self-pay

## 2019-07-26 ENCOUNTER — Encounter: Payer: Self-pay | Admitting: Family Medicine

## 2019-07-26 ENCOUNTER — Ambulatory Visit (INDEPENDENT_AMBULATORY_CARE_PROVIDER_SITE_OTHER): Payer: 59 | Admitting: Family Medicine

## 2019-07-26 ENCOUNTER — Ambulatory Visit: Payer: 59 | Admitting: Family Medicine

## 2019-07-26 VITALS — BP 120/86 | Ht 64.0 in | Wt 167.0 lb

## 2019-07-26 DIAGNOSIS — M549 Dorsalgia, unspecified: Secondary | ICD-10-CM | POA: Diagnosis not present

## 2019-07-26 DIAGNOSIS — M62838 Other muscle spasm: Secondary | ICD-10-CM

## 2019-07-26 DIAGNOSIS — G8929 Other chronic pain: Secondary | ICD-10-CM

## 2019-07-26 DIAGNOSIS — M5440 Lumbago with sciatica, unspecified side: Secondary | ICD-10-CM

## 2019-07-26 DIAGNOSIS — R52 Pain, unspecified: Secondary | ICD-10-CM

## 2019-07-26 DIAGNOSIS — M542 Cervicalgia: Secondary | ICD-10-CM

## 2019-07-26 DIAGNOSIS — K219 Gastro-esophageal reflux disease without esophagitis: Secondary | ICD-10-CM

## 2019-07-26 LAB — CBC
HCT: 39.6 % (ref 35.0–45.0)
Hemoglobin: 13.4 g/dL (ref 11.7–15.5)
MCH: 32.2 pg (ref 27.0–33.0)
MCHC: 33.8 g/dL (ref 32.0–36.0)
MCV: 95.2 fL (ref 80.0–100.0)
MPV: 10.2 fL (ref 7.5–12.5)
Platelets: 317 10*3/uL (ref 140–400)
RBC: 4.16 10*6/uL (ref 3.80–5.10)
RDW: 12.8 % (ref 11.0–15.0)
WBC: 9.2 10*3/uL (ref 3.8–10.8)

## 2019-07-26 LAB — BASIC METABOLIC PANEL WITH GFR
BUN: 7 mg/dL (ref 7–25)
CO2: 27 mmol/L (ref 20–32)
Calcium: 9.7 mg/dL (ref 8.6–10.4)
Chloride: 107 mmol/L (ref 98–110)
Creat: 0.65 mg/dL (ref 0.50–1.05)
GFR, Est African American: 117 mL/min/{1.73_m2} (ref >=60)
GFR, Est Non African American: 101 mL/min/{1.73_m2} (ref >=60)
Glucose, Bld: 107 mg/dL — ABNORMAL HIGH (ref 65–99)
Potassium: 4.1 mmol/L (ref 3.5–5.3)
Sodium: 142 mmol/L (ref 135–146)

## 2019-07-26 LAB — LIPID PANEL
Cholesterol: 225 mg/dL — ABNORMAL HIGH (ref <200)
HDL: 65 mg/dL (ref >=50)
LDL Cholesterol (Calc): 137 mg/dL (calc) — ABNORMAL HIGH
Non-HDL Cholesterol (Calc): 160 mg/dL (calc) — ABNORMAL HIGH (ref <130)
Total CHOL/HDL Ratio: 3.5 (calc) (ref <5.0)
Triglycerides: 112 mg/dL (ref <150)

## 2019-07-26 LAB — VITAMIN D 25 HYDROXY (VIT D DEFICIENCY, FRACTURES): Vit D, 25-Hydroxy: 22 ng/mL — ABNORMAL LOW (ref 30–100)

## 2019-07-26 LAB — TSH: TSH: 0.52 mIU/L

## 2019-07-26 MED ORDER — HYDROCODONE-ACETAMINOPHEN 7.5-325 MG PO TABS: ORAL_TABLET | ORAL | 0 refills | Status: DC

## 2019-07-26 MED ORDER — FLUCONAZOLE 150 MG PO TABS: ORAL_TABLET | ORAL | 0 refills | Status: DC

## 2019-07-26 MED ORDER — KETOROLAC TROMETHAMINE 60 MG/2ML IM SOLN
60.0000 mg | Freq: Once | INTRAMUSCULAR | Status: AC
  Administered 2019-07-26: 11:00:00 60 mg via INTRAMUSCULAR

## 2019-07-26 MED ORDER — CYCLOBENZAPRINE HCL 10 MG PO TABS: ORAL_TABLET | ORAL | 3 refills | Status: DC

## 2019-07-26 MED ORDER — PANTOPRAZOLE SODIUM 20 MG PO TBEC: 20.0000 mg | DELAYED_RELEASE_TABLET | Freq: Every day | ORAL | 0 refills | Status: DC

## 2019-07-26 MED ORDER — METHYLPREDNISOLONE ACETATE 80 MG/ML IJ SUSP
80.0000 mg | Freq: Once | INTRAMUSCULAR | Status: AC
  Administered 2019-07-26: 80 mg via INTRAMUSCULAR

## 2019-07-26 NOTE — Progress Notes (Signed)
Virtual Visit via Telephone Note  I connected with Anna Cantu on 07/26/19 at  3:00 PM EST by telephone and verified that I am speaking with the correct person using two identifiers.  Location: Patient: home Provider: office   I discussed the limitations, risks, security and privacy concerns of performing an evaluation and management service by telephone and the availability of in person appointments. I also discussed with the patient that there may be a patient responsible charge related to this service. The patient expressed understanding and agreed to proceed.   History of Present Illness: F/U chronic problems, medication review, and refill medication when necessary. Review most recent labs and order labs which are due Review preventive health and update with necessary referrals or immunizations as indicated Denies recent fever or chills. Denies sinus pressure, nasal congestion, ear pain or sore throat. Denies chest congestion, productive cough or wheezing. Denies chest pains, palpitations and leg swelling C/o nausea and uncontrolled reflux symptoms  Denies dysuria, frequency, hesitancy or incontinence. C/o chronic neck and back pain Denies headaches, seizures, numbness, or tingling. Denies depression, anxiety or insomnia. Denies skin break down or rash.       Observations/Objective: BP 120/86   Ht 5\' 4"  (1.626 m)   Wt 167 lb (75.8 kg)   LMP 07/01/2013   BMI 28.67 kg/m  Good communication with no confusion and intact memory. Alert and oriented x 3 No signs of respiratory distress during speech    Assessment and Plan:  Encounter for pain management The patient's Controlled Substance registry is reviewed and compliance confirmed. Adequacy of  Pain control and level of function is assessed. Medication dosing is adjusted as deemed appropriate. Twelve weeks of medication is prescribed , patient signs for the script and is provided with a follow up appointment  between 11 to 12 weeks .   Neck muscle spasm Start bedtime muscle relaxant as needed  GERD (gastroesophageal reflux disease) Uncontrolled, needs to modify diet and commit to PPI   Follow Up Instructions:    I discussed the assessment and treatment plan with the patient. The patient was provided an opportunity to ask questions and all were answered. The patient agreed with the plan and demonstrated an understanding of the instructions.   The patient was advised to call back or seek an in-person evaluation if the symptoms worsen or if the condition fails to improve as anticipated.  I provided 25 minutes of non-face-to-face time during this encounter.   [name], MD

## 2019-07-26 NOTE — Patient Instructions (Addendum)
F/u ion office with MD week of June 7 , call if you need me sooner  You have medication for reflux, please take daily, and change food choice , this should reduce your nausea, also no caffeine , and stop eating 3 hours before bedtime  Please reduce fried and fatty foods, as cholesterol is higher than it should be  Muscle relaxant for bedtime use is prescribed  Use stool softeners ever day   Thankful, surgery has been helpful

## 2019-07-27 ENCOUNTER — Encounter: Payer: Self-pay | Admitting: Family Medicine

## 2019-07-27 ENCOUNTER — Ambulatory Visit (INDEPENDENT_AMBULATORY_CARE_PROVIDER_SITE_OTHER): Payer: Self-pay | Admitting: General Surgery

## 2019-07-27 ENCOUNTER — Other Ambulatory Visit: Payer: Self-pay

## 2019-07-27 ENCOUNTER — Encounter: Payer: Self-pay | Admitting: General Surgery

## 2019-07-27 VITALS — BP 103/60 | HR 59 | Temp 98.8°F | Resp 12 | Ht 64.0 in | Wt 168.0 lb

## 2019-07-27 DIAGNOSIS — K643 Fourth degree hemorrhoids: Secondary | ICD-10-CM

## 2019-07-27 DIAGNOSIS — K602 Anal fissure, unspecified: Secondary | ICD-10-CM

## 2019-07-27 NOTE — Progress Notes (Signed)
[facility] Note   HPI:  54 y.o. Female presents to clinic for post-op follow-up evaluation of her hemorrhoidectomy. She says she has been doing ok but mostly talks about having a lot nausea and vomiting in the morning. Dr. Moshe Cipro thinks she could have worsening reflux . She says her rectal pain is much improved. She says she did have a hard stool and felt pain with that and has a prior history of anal fissure, so she used some of her diltiazem /lidocaine compound.   Review of Systems:  No fever or chills Improving pain Regular Stools with some hard stools  All other review of systems: otherwise negative   Vital Signs:  BP 103/60   Pulse (!) 59   Temp 98.8 F (37.1 C) (Oral)   Resp 12   Ht 5\' 4"  (1.626 m)   Wt 168 lb (76.2 kg)   LMP 07/01/2013   SpO2 99%   BMI 28.84 kg/m    Physical Exam:  Physical Exam Vitals reviewed.  HENT:     Head: Normocephalic.  Cardiovascular:     Rate and Rhythm: Normal rate.  Pulmonary:     Effort: Pulmonary effort is normal.  Genitourinary:    Comments: Healing anal area, no erythema or drainage, no tenderness, no external tissue noted Neurological:     Mental Status: She is alert.     Assessment:  54 y.o. yo Female s/p hemorrhoidectomy and prior anal fissure s/p diltiazem treatment. Doing well. She is complaining of nausea and vomiting and was recently placed back on a PPI she reports. She says Dr. Moshe Cipro did say she may need a Endoscopy again and I agree if her symptoms to not improve.   Plan:  Use cream for fissure as needed if you have a sharp BM and pain in the rectal area. Follow up with Dr. Gala Romney for possible endoscopy - your last one was in 2015. [phone] Call if you are having issues. Return to work without restrictions.   All of the above recommendations were discussed with the patient and all of patient's questions were answered to her expressed satisfaction.  Curlene Labrum, MD [facility] [address] [place], [place] [address] [phone] (office)

## 2019-07-27 NOTE — Patient Instructions (Addendum)
Use cream for fissure as needed if you have a sharp BM and pain in the rectal area. Follow up with Dr. Gala Romney for possible endoscopy - your last one was in 2015. [phone] Call if you are having issues. Return to work without restrictions.

## 2019-07-29 ENCOUNTER — Telehealth: Payer: Self-pay

## 2019-07-29 NOTE — Telephone Encounter (Signed)
[employer] insurance paperwork completed and faxed to  [phone]. Return to work date 07/27/19.

## 2019-07-31 ENCOUNTER — Encounter: Payer: Self-pay | Admitting: Family Medicine

## 2019-07-31 MED ORDER — HYDROCODONE-ACETAMINOPHEN 7.5-325 MG PO TABS: ORAL_TABLET | ORAL | 0 refills | Status: AC

## 2019-07-31 NOTE — Assessment & Plan Note (Signed)
Uncontrolled, needs to modify diet and commit to PPI

## 2019-07-31 NOTE — Assessment & Plan Note (Signed)
The patient's Controlled Substance registry is reviewed and compliance confirmed. Adequacy of  Pain control and level of function is assessed. Medication dosing is adjusted as deemed appropriate. Twelve weeks of medication is prescribed , patient signs for the script and is provided with a follow up appointment between 11 to 12 weeks .  

## 2019-07-31 NOTE — Assessment & Plan Note (Signed)
Start bedtime muscle relaxant as needed

## 2019-08-05 ENCOUNTER — Encounter: Payer: Self-pay | Admitting: Family Medicine

## 2019-08-09 ENCOUNTER — Telehealth: Payer: Self-pay

## 2019-08-09 DIAGNOSIS — M25562 Pain in left knee: Secondary | ICD-10-CM

## 2019-08-09 NOTE — Telephone Encounter (Signed)
Referred to ortho per Jarrett Soho

## 2019-08-16 ENCOUNTER — Ambulatory Visit: Payer: 59 | Admitting: Orthopedic Surgery

## 2019-08-16 ENCOUNTER — Other Ambulatory Visit: Payer: Self-pay

## 2019-10-11 ENCOUNTER — Encounter: Payer: Self-pay | Admitting: Orthopedic Surgery

## 2019-10-11 ENCOUNTER — Ambulatory Visit: Payer: 59 | Admitting: Orthopedic Surgery

## 2019-10-19 ENCOUNTER — Other Ambulatory Visit: Payer: Self-pay

## 2019-10-19 ENCOUNTER — Ambulatory Visit: Payer: Self-pay

## 2019-10-19 DIAGNOSIS — Z0289 Encounter for other administrative examinations: Secondary | ICD-10-CM

## 2019-10-19 NOTE — Progress Notes (Signed)
UDS left for urine drug screen

## 2019-10-20 ENCOUNTER — Other Ambulatory Visit (HOSPITAL_COMMUNITY)
Admission: RE | Admit: 2019-10-20 | Discharge: 2019-10-20 | Disposition: A | Discharge status: Home / Self Care | Payer: Self-pay | Source: Other Acute Inpatient Hospital | Attending: Family Medicine | Admitting: Family Medicine

## 2019-10-20 DIAGNOSIS — Z0289 Encounter for other administrative examinations: Secondary | ICD-10-CM | POA: Insufficient documentation

## 2019-10-23 LAB — MISC LABCORP TEST (SEND OUT): Labcorp test code: 738526

## 2019-10-26 ENCOUNTER — Ambulatory Visit: Payer: Self-pay | Admitting: Family Medicine

## 2019-10-27 ENCOUNTER — Other Ambulatory Visit: Payer: Self-pay

## 2019-10-27 ENCOUNTER — Telehealth (INDEPENDENT_AMBULATORY_CARE_PROVIDER_SITE_OTHER): Payer: Self-pay | Admitting: Family Medicine

## 2019-10-27 VITALS — BP 120/85 | Ht 64.0 in | Wt 164.0 lb

## 2019-10-27 DIAGNOSIS — G8929 Other chronic pain: Secondary | ICD-10-CM

## 2019-10-27 DIAGNOSIS — M5442 Lumbago with sciatica, left side: Secondary | ICD-10-CM

## 2019-10-27 NOTE — Patient Instructions (Signed)
° ° ° °  If you have lab work done today you will be contacted with your lab results within the next 2 weeks.  If you have not heard from us then please contact us. The fastest way to get your results is to register for My Chart. ° ° °IF you received an x-ray today, you will receive an invoice from [facility]. Please contact [facility] at [phone] with questions or concerns regarding your invoice.  ° °IF you received labwork today, you will receive an invoice from [facility]. Please contact [facility] at [phone] with questions or concerns regarding your invoice.  ° °Our billing staff will not be able to assist you with questions regarding bills from these companies. ° °You will be contacted with the lab results as soon as they are available. The fastest way to get your results is to activate your My Chart account. Instructions are located on the last page of this paperwork. If you have not heard from us regarding the results in 2 weeks, please contact this office. °  ° ° ° °

## 2019-10-28 ENCOUNTER — Telehealth (INDEPENDENT_AMBULATORY_CARE_PROVIDER_SITE_OTHER): Payer: Self-pay | Admitting: Family Medicine

## 2019-10-28 ENCOUNTER — Encounter: Payer: Self-pay | Admitting: Family Medicine

## 2019-10-28 VITALS — BP 120/85 | Ht 64.0 in | Wt 164.0 lb

## 2019-10-28 DIAGNOSIS — G43919 Migraine, unspecified, intractable, without status migrainosus: Secondary | ICD-10-CM

## 2019-10-28 DIAGNOSIS — M62838 Other muscle spasm: Secondary | ICD-10-CM

## 2019-10-28 DIAGNOSIS — R52 Pain, unspecified: Secondary | ICD-10-CM

## 2019-10-28 DIAGNOSIS — M25561 Pain in right knee: Secondary | ICD-10-CM

## 2019-10-28 DIAGNOSIS — M542 Cervicalgia: Secondary | ICD-10-CM

## 2019-10-28 DIAGNOSIS — M25562 Pain in left knee: Secondary | ICD-10-CM

## 2019-10-28 DIAGNOSIS — R232 Flushing: Secondary | ICD-10-CM

## 2019-10-28 MED ORDER — VENLAFAXINE HCL 37.5 MG PO TABS: 37.5000 mg | ORAL_TABLET | Freq: Two times a day (BID) | ORAL | 3 refills | Status: DC

## 2019-10-28 NOTE — Progress Notes (Signed)
  Virtual Visit via Telephone Note  I connected with VENNESA PATTY on 10/29/19 at  8:00 AM EDT by telephone and verified that I am speaking with the correct person using two identifiers.  Location: Patient: home Provider: office   I discussed the limitations, risks, security and privacy concerns of performing an evaluation and management service by telephone and the availability of in person appointments. I also discussed with the patient that there may be a patient responsible charge related to this service. The patient expressed understanding and agreed to proceed.   History of Present Illness: Visit to address need for pain management and she describes overall, generalized pain ijn neck, knees and upper and lower back.She has been managed by ortho for knee pain with intra articular injections but states that the quality of her life is severely negatively  affected  . She understands clearly that because of recent UDS showing non compliance with medication, states she takes 2 tabs some days , as a reason to explain the fact that no hydrocodone detected in her screen. I make it clear this is a breech of contract, and that she will have random screens in the future , this is her only one chance, and additionally no valium will be prescribed for neck spasm. She refused flexeril stating that it was ineffective C/o excess night sweat Observations/Objective: BP 120/85   Ht 5\' 4"  (1.626 m)   Wt 164 lb (74.4 kg)   LMP 07/01/2013   BMI 28.15 kg/m    Assessment and Plan: Encounter for pain management The patient's Controlled Substance registry is reviewed and compliance addressed. Adequacy of  Pain control and level of function is assessed. Medication dosing is adjusted as deemed appropriate. Twelve weeks of medication is prescribed ,  follow up appointment between 11 to 12 weeks .   Neck muscle spasm No prescription medication management, massages and topical agents  only  Arthralgia of both knees Needs to f/u with Ortho  Neck pain Chronic  rated at a 6 to 8 pain management needs as stated, any additional need will need to be addressed in Pain management   Migraine headache fioricet not detected in urine so removed from active med list  Hot flashes Behavioral management and velafexine    Follow Up Instructions:    I discussed the assessment and treatment plan with the patient. The patient was provided an opportunity to ask questions and all were answered. The patient agreed with the plan and demonstrated an understanding of the instructions.   The patient was advised to call back or seek an in-person evaluation if the symptoms worsen or if the condition fails to improve as anticipated.  I provided 15 minutes of non-face-to-face time during this encounter.   [name], MD

## 2019-10-29 ENCOUNTER — Encounter: Payer: Self-pay | Admitting: Family Medicine

## 2019-10-29 DIAGNOSIS — M542 Cervicalgia: Secondary | ICD-10-CM | POA: Insufficient documentation

## 2019-10-29 MED ORDER — HYDROCODONE-ACETAMINOPHEN 7.5-325 MG PO TABS: ORAL_TABLET | ORAL | 0 refills | Status: AC

## 2019-10-29 NOTE — Assessment & Plan Note (Signed)
fioricet not detected in urine so removed from active med list

## 2019-10-29 NOTE — Assessment & Plan Note (Signed)
No prescription medication management, massages and topical agents only

## 2019-10-29 NOTE — Assessment & Plan Note (Signed)
Behavioral management and velafexine

## 2019-10-29 NOTE — Assessment & Plan Note (Signed)
Chronic  rated at a 6 to 8 pain management needs as stated, any additional need will need to be addressed in Pain management

## 2019-10-29 NOTE — Progress Notes (Signed)
Pt not seen on this day, appointment rescheduled to 10/28/2019

## 2019-10-29 NOTE — Assessment & Plan Note (Signed)
The patient's Controlled Substance registry is reviewed and compliance addressed. Adequacy of  Pain control and level of function is assessed. Medication dosing is adjusted as deemed appropriate. Twelve weeks of medication is prescribed ,  follow up appointment between 11 to 12 weeks .

## 2019-10-29 NOTE — Assessment & Plan Note (Signed)
Needs to f/u with Ortho

## 2019-11-02 ENCOUNTER — Ambulatory Visit: Payer: 59 | Admitting: Family Medicine

## 2020-03-13 ENCOUNTER — Encounter: Payer: Self-pay | Admitting: Family Medicine

## 2020-03-14 ENCOUNTER — Encounter: Payer: Self-pay | Admitting: Internal Medicine

## 2020-03-14 ENCOUNTER — Ambulatory Visit (INDEPENDENT_AMBULATORY_CARE_PROVIDER_SITE_OTHER): Payer: Self-pay | Admitting: Internal Medicine

## 2020-03-14 ENCOUNTER — Other Ambulatory Visit: Payer: Self-pay

## 2020-03-14 VITALS — BP 119/78 | HR 92 | Temp 97.6°F | Resp 18 | Ht 64.0 in | Wt 157.1 lb

## 2020-03-14 DIAGNOSIS — G8929 Other chronic pain: Secondary | ICD-10-CM

## 2020-03-14 DIAGNOSIS — M1990 Unspecified osteoarthritis, unspecified site: Secondary | ICD-10-CM

## 2020-03-14 DIAGNOSIS — F419 Anxiety disorder, unspecified: Secondary | ICD-10-CM

## 2020-03-14 DIAGNOSIS — M62838 Other muscle spasm: Secondary | ICD-10-CM

## 2020-03-14 DIAGNOSIS — M5440 Lumbago with sciatica, unspecified side: Secondary | ICD-10-CM

## 2020-03-14 MED ORDER — TRAZODONE HCL 50 MG PO TABS: 50.0000 mg | ORAL_TABLET | Freq: Every evening | ORAL | 3 refills | Status: DC | PRN

## 2020-03-14 MED ORDER — TIZANIDINE HCL 4 MG PO CAPS: 4.0000 mg | ORAL_CAPSULE | Freq: Three times a day (TID) | ORAL | 2 refills | Status: DC

## 2020-03-14 MED ORDER — KETOROLAC TROMETHAMINE 60 MG/2ML IM SOLN
60.0000 mg | Freq: Once | INTRAMUSCULAR | Status: AC
  Administered 2020-03-14: 60 mg via INTRAMUSCULAR

## 2020-03-14 MED ORDER — METHYLPREDNISOLONE ACETATE 80 MG/ML IJ SUSP
80.0000 mg | Freq: Once | INTRAMUSCULAR | Status: AC
  Administered 2020-03-14: 80 mg via INTRAMUSCULAR

## 2020-03-14 NOTE — Patient Instructions (Addendum)
You are given a steroid and Ketorolac (Toradol) injection - for pain and inflammation.  You are encouraged to continue exercises for knee arthritis. Please continue to follow up with Orthopedic surgeon.  Start taking Trazodone as needed for sleep and anxiety at nighttime.  Start taking Tizanidine every 8 hours as needed for muscle spasms, maximum 3 in a day.  Please try to engage in activities of your liking, outdoor activities, meet with family and friends.

## 2020-03-14 NOTE — Telephone Encounter (Signed)
Pt made an appt to see dr patel 03-14-20

## 2020-03-14 NOTE — Progress Notes (Signed)
Established Patient Office Visit  Subjective:  Patient ID: Anna Cantu, female    DOB: 1965/10/05  Age: 54 y.o. MRN: [id number]  CC:  Chief Complaint  Patient presents with  . Back Pain    has pain in neck back and knees stopped taking her vicodine per choice and has tried remedies at home but nothing is working     HPI Anna Cantu is a 54 year old female with PMH of anxiety, knee arthritis, chronic back pain and GERD presents for evaluation of neck pain and knee pain. Patient states that her she has noticed knot in the right side of the neck, pain is dull, constant, also affects her right shoulder, worse with movement and better with simple neck exercises. Her Flexeril has not been helping her now and she wakes up in the middle of the night.  She also c/o knee pain due to her arthritis. She has been doing exercises as suggested and has been trying lose weight. She is requesting the injections for inflammation and pain.  Patient has been stressed recently at home as she is taking care of her parents, who have dementia. She has difficulty maintaining sleep. Denies change in appetite, concentration, suicidal ideation. She had job interview today and is hopeful that she will slowly feel better with the job.  Past Medical History:  Diagnosis Date  . Chronic back pain   . Gastritis   . Hemorrhoids   . Nicotine addiction   . Obesity   . Ovarian cyst     Past Surgical History:  Procedure Laterality Date  . btl and endometrial abaltion    . CHOLECYSTECTOMY  2007   Dr. Aviva Signs   . COLONOSCOPY   08/19/2008   Dr. Madolyn Frieze canal hemorrhoids.  Diminutive rectal polyp  status post cold biopsy and removal.  The remainder of rectal mucosa and colon was normal. Path: polypoid.   Marland Kitchen COLONOSCOPY WITH ESOPHAGOGASTRODUODENOSCOPY (EGD) N/A 07/08/2013   RMR: Rectal and colonic polyps-one tubular adenoma. colonic diverticulosis. Hemorroids-likely source of hematochezia.   NEXT TCS IN 7 YEARS. Small hiatal hernia . Antral erosions;nonbleeding duodenal AVM; status post gastric biopsy with severe H.pylori  . ESOPHAGOGASTRODUODENOSCOPY   08/19/2008   Dr. Rourk:Distal esophageal erosions, consistent with erosive reflux esophagitis/small HH/The remainder of her upper GI tract appeared normal  . HEMORRHOID SURGERY N/A 06/11/2019   Procedure: HEMORRHOIDECTOMY EXTENSIVE;  Surgeon: Virl Cagey, MD;  Location: [facility];  Service: General;  Laterality: N/A;  . left knee surgey- arthroscopy June 2011     Dr. Percell Miller   . right carpal tunnel release  1991  . Right Inguinal hernia repair  1982  . right knee surgery  1993  . TUBAL LIGATION      Family History  Problem Relation Age of Onset  . Hypertension Mother   . Stroke Mother   . Hypertension Father   . Breast cancer Paternal Grandmother   . Colon cancer Neg Hx     Social History   Socioeconomic History  . Marital status: Divorced    Spouse name: Not on file  . Number of children: Not on file  . Years of education: Not on file  . Highest education level: Not on file  Occupational History  . Not on file  Tobacco Use  . Smoking status: Former Smoker    Packs/day: 0.50    Types: Cigarettes  . Smokeless tobacco: Never Used  . Tobacco comment: quit Mar 03, 2013   Substance and  Sexual Activity  . Alcohol use: Yes    Comment: occasionally  . Drug use: No  . Sexual activity: Yes    Birth control/protection: Surgical  Other Topics Concern  . Not on file  Social History Narrative  . Not on file   Social Determinants of Health   Financial Resource Strain:   . Difficulty of Paying Living Expenses: Not on file  Food Insecurity:   . Worried About Charity fundraiser in the Last Year: Not on file  . Ran Out of Food in the Last Year: Not on file  Transportation Needs:   . Lack of Transportation (Medical): Not on file  . Lack of Transportation (Non-Medical): Not on file  Physical Activity:   . Days of  Exercise per Week: Not on file  . Minutes of Exercise per Session: Not on file  Stress:   . Feeling of Stress : Not on file  Social Connections:   . Frequency of Communication with Friends and Family: Not on file  . Frequency of Social Gatherings with Friends and Family: Not on file  . Attends Religious Services: Not on file  . Active Member of Clubs or Organizations: Not on file  . Attends Archivist Meetings: Not on file  . Marital Status: Not on file  Intimate Partner Violence:   . Fear of Current or Ex-Partner: Not on file  . Emotionally Abused: Not on file  . Physically Abused: Not on file  . Sexually Abused: Not on file    Outpatient Medications Prior to Visit  Medication Sig Dispense Refill  . acetaminophen (TYLENOL) 500 MG tablet Take 1 tablet (500 mg total) by mouth every 6 (six) hours as needed. 30 tablet 0  . Ascorbic Acid (VITAMIN C) 1000 MG tablet Take 1,000 mg by mouth daily.    . Biotin 5000 MCG TABS Take 5,000 mcg by mouth daily.    . cholecalciferol (VITAMIN D3) 25 MCG (1000 UT) tablet Take 1,000 Units by mouth daily.    . diclofenac sodium (VOLTAREN) 1 % GEL Apply twice daily to affected areas (Patient taking differently: Apply 2 g topically 2 (two) times daily. Apply twice daily to affected areas) 100 g 3  . ELDERBERRY PO Take 1 Scoop by mouth daily.     . Magnesium 100 MG CAPS Take 100 mg by mouth daily as needed (cramps).    . montelukast (SINGULAIR) 10 MG tablet Take 1 tablet (10 mg total) by mouth at bedtime. (Patient taking differently: Take 10 mg by mouth at bedtime as needed (allergies). ) 30 tablet 3  . Multiple Vitamin (MULITIVITAMIN WITH MINERALS) TABS Take 1 tablet by mouth daily.    . pantoprazole (PROTONIX) 20 MG tablet Take 1 tablet (20 mg total) by mouth daily. 90 tablet 0  . Vitamin A 2400 MCG (8000 UT) CAPS Take 8,000 Units by mouth daily.     . vitamin B-12 (CYANOCOBALAMIN) 500 MCG tablet Take 500 mcg by mouth daily.    . Zinc 50 MG TABS  Take 50 mg by mouth daily.     Marland Kitchen docusate sodium (COLACE) 100 MG capsule Take 1 capsule (100 mg total) by mouth 2 (two) times daily. (Patient not taking: Reported on 03/14/2020) 60 capsule 2  . fluconazole (DIFLUCAN) 150 MG tablet Take one tablet once daily as needed, for vaginal itch (Patient not taking: Reported on 03/14/2020) 2 tablet 0  . hyoscyamine (LEVBID) 0.375 MG 12 hr tablet Take 1 tablet (0.375 mg total) by  mouth every 12 (twelve) hours as needed. (Patient not taking: Reported on 03/14/2020) 30 tablet 0  . ibuprofen (ADVIL,MOTRIN) 800 MG tablet Take 1 tablet (800 mg total) by mouth every 8 (eight) hours as needed. Max 3 tabs a week (Patient not taking: Reported on 03/14/2020) 30 tablet 1  . cyclobenzaprine (FLEXERIL) 10 MG tablet Take one tablet at bedtime as needed, for back and neck spasm (Patient not taking: Reported on 03/14/2020) 30 tablet 3  . venlafaxine (EFFEXOR) 37.5 MG tablet Take 1 tablet (37.5 mg total) by mouth 2 (two) times daily. (Patient not taking: Reported on 03/14/2020) 30 tablet 3   No facility-administered medications prior to visit.    Allergies  Allergen Reactions  . Ciprofloxacin     ?rash related to cipro or flagyl vs other reason  . Flagyl [Metronidazole]     ?rash to cipro or flagyl vs other reason  . Oxycodone     Nausea, makes her feel sick  . Amoxicillin Itching and Rash    Did it involve swelling of the face/tongue/throat, SOB, or low BP? No Did it involve sudden or severe rash/hives, skin peeling, or any reaction on the inside of your mouth or nose? No Did you need to seek medical attention at a hospital or doctor's office? No When did it last happen? If all above answers are "NO", may proceed with cephalosporin use.     ROS Review of Systems  Constitutional: Negative for chills and fever.  HENT: Negative for congestion, sinus pressure, sinus pain and sore throat.   Eyes: Negative for pain and discharge.  Respiratory: Negative for  cough and shortness of breath.   Cardiovascular: Negative for chest pain and palpitations.  Gastrointestinal: Negative for abdominal pain, constipation, diarrhea, nausea and vomiting.  Endocrine: Negative for polydipsia and polyuria.  Genitourinary: Negative for dysuria and hematuria.  Musculoskeletal: Positive for arthralgias (B/l knee) and neck pain. Negative for neck stiffness.  Skin: Negative for rash.  Neurological: Negative for dizziness, syncope, weakness, light-headedness and headaches.  Psychiatric/Behavioral: Negative for agitation and behavioral problems.      Objective:    Physical Exam Vitals reviewed.  Constitutional:      General: She is not in acute distress.    Appearance: She is not diaphoretic.  HENT:     Head: Normocephalic and atraumatic.     Nose: Nose normal. No congestion.     Mouth/Throat:     Mouth: Mucous membranes are moist.     Pharynx: No posterior oropharyngeal erythema.  Eyes:     General: No scleral icterus.    Extraocular Movements: Extraocular movements intact.     Pupils: Pupils are equal, round, and reactive to light.  Neck:     Vascular: No carotid bruit.  Cardiovascular:     Rate and Rhythm: Normal rate and regular rhythm.     Heart sounds: Normal heart sounds. No murmur heard.  No friction rub. No gallop.   Pulmonary:     Breath sounds: Normal breath sounds. No wheezing or rales.  Abdominal:     Palpations: Abdomen is soft.     Tenderness: There is no abdominal tenderness.  Musculoskeletal:        General: Tenderness (B/l knee, mild swelling, no erythema) present.     Cervical back: Neck supple. Tenderness (Right side, around the upper scapular border) present. No rigidity.     Right lower leg: No edema.     Left lower leg: No edema.  Skin:  General: Skin is warm.     Findings: No rash.  Neurological:     General: No focal deficit present.     Mental Status: She is alert and oriented to person, place, and time.     Sensory:  No sensory deficit.     Motor: No weakness.  Psychiatric:        Mood and Affect: Mood normal.        Behavior: Behavior normal.     BP 119/78 (BP Location: Right Arm, Patient Position: Sitting, Cuff Size: Normal)   Pulse 92   Temp 97.6 F (36.4 C) (Temporal)   Resp 18   Ht 5\' 4"  (1.626 m)   Wt 157 lb 1.9 oz (71.3 kg)   LMP 07/01/2013   SpO2 98%   BMI 26.97 kg/m  Wt Readings from Last 3 Encounters:  03/14/20 157 lb 1.9 oz (71.3 kg)  10/28/19 164 lb (74.4 kg)  10/27/19 164 lb (74.4 kg)     Health Maintenance Due  Topic Date Due  . COVID-19 Vaccine (1) Never done  . INFLUENZA VACCINE  12/26/2019  . PAP SMEAR-Modifier  04/04/2020    There are no preventive care reminders to display for this patient.  Lab Results  Component Value Date   TSH 0.52 07/26/2019   Lab Results  Component Value Date   WBC 9.2 07/26/2019   HGB 13.4 07/26/2019   HCT 39.6 07/26/2019   MCV 95.2 07/26/2019   PLT 317 07/26/2019   Lab Results  Component Value Date   NA 142 07/26/2019   K 4.1 07/26/2019   CO2 27 07/26/2019   GLUCOSE 107 (H) 07/26/2019   BUN 7 07/26/2019   CREATININE 0.65 07/26/2019   BILITOT 0.3 01/18/2015   ALKPHOS 75 01/18/2015   AST 14 01/18/2015   ALT 16 01/18/2015   PROT 6.5 01/18/2015   ALBUMIN 4.1 01/18/2015   CALCIUM 9.7 07/26/2019   ANIONGAP 11 06/25/2017   Lab Results  Component Value Date   CHOL 225 (H) 07/26/2019   Lab Results  Component Value Date   HDL 65 07/26/2019   Lab Results  Component Value Date   LDLCALC 137 (H) 07/26/2019   Lab Results  Component Value Date   TRIG 112 07/26/2019   Lab Results  Component Value Date   CHOLHDL 3.5 07/26/2019   Lab Results  Component Value Date   HGBA1C 5.4 01/18/2015      Assessment & Plan:   Problem List Items Addressed This Visit      Musculoskeletal and Integument   Neck muscle spasm - Primary Advised to continue simple neck exercises Counseled for simple massage technique for  trapezius muscle strain Heating pad and/or local pain relieving cream Started Tizanidine, does not prefer Flexeril   Relevant Medications   tiZANidine (ZANAFLEX) 4 MG capsule    Other Visit Diagnoses    Anxiety Due to recent stressors in personal life Patient prefers Xanax, counseled to avoid Benzos. Patient agrees to try Trazodone. Counseled for sleep hygiene   Relevant Medications   traZODone (DESYREL) 50 MG tablet   Arthritis and chronic back pain Likely osteoarthritis Follows up with Orthopedic surgeon Toradol and Depo-Medrol injection for knee arthritis and back pain Continue exercises for knee and back pain   Relevant Medications   tiZANidine (ZANAFLEX) 4 MG capsule      Meds ordered this encounter  Medications  . tiZANidine (ZANAFLEX) 4 MG capsule    Sig: Take 1 capsule (4 mg total) by  mouth 3 (three) times daily.    Dispense:  60 capsule    Refill:  2  . traZODone (DESYREL) 50 MG tablet    Sig: Take 1 tablet (50 mg total) by mouth at bedtime as needed for sleep (Anxiety).    Dispense:  30 tablet    Refill:  3    Follow-up: Return if symptoms worsen or fail to improve.    Lindell Spar, MD

## 2020-03-28 ENCOUNTER — Telehealth: Payer: Self-pay

## 2020-03-28 NOTE — Telephone Encounter (Signed)
Patient lvm that she was having cold/flu like symptoms and wanted to see if Dr.Simpson would call her in something. Returned patients call and explained to patient she would need to be seen first. Scheduled her a virtual appt with Dr.Patel for 11/3 at 8:20am

## 2020-03-29 ENCOUNTER — Telehealth (INDEPENDENT_AMBULATORY_CARE_PROVIDER_SITE_OTHER): Payer: Self-pay | Admitting: Internal Medicine

## 2020-03-29 ENCOUNTER — Encounter: Payer: Self-pay | Admitting: Internal Medicine

## 2020-03-29 ENCOUNTER — Other Ambulatory Visit: Payer: Self-pay

## 2020-03-29 DIAGNOSIS — Z7189 Other specified counseling: Secondary | ICD-10-CM

## 2020-03-29 DIAGNOSIS — Z20822 Contact with and (suspected) exposure to covid-19: Secondary | ICD-10-CM

## 2020-03-29 MED ORDER — BENZONATATE 100 MG PO CAPS: 100.0000 mg | ORAL_CAPSULE | Freq: Two times a day (BID) | ORAL | 0 refills | Status: DC | PRN

## 2020-03-29 NOTE — Progress Notes (Signed)
Virtual Visit via Telephone Note   This visit type was conducted due to national recommendations for restrictions regarding the COVID-19 Pandemic (e.g. social distancing) in an effort to limit this patient's exposure and mitigate transmission in our community.  Due to her co-morbid illnesses, this patient is at least at moderate risk for complications without adequate follow up.  This format is felt to be most appropriate for this patient at this time.  The patient did not have access to video technology/had technical difficulties with video requiring transitioning to audio format only (telephone).  All issues noted in this document were discussed and addressed.  No physical exam could be performed with this format.  Evaluation Performed:  Follow-up visit  Date:  03/29/2020   ID:  Anna Cantu, DOB 09-30-1965, MRN [id number]  Patient Location: Home Provider Location: Home Office  Location of Patient: Home Location of Provider: Telehealth Consent was obtain for visit to be over via telehealth. I verified that I am speaking with the correct person using two identifiers.  PCP:  Fayrene Helper, MD   Chief Complaint: Cough, nasal congestion  History of Present Illness:    Anna Cantu is a 54 y.o. femalewho has a televisit for c/o cough and nasal congestion for last 2 days.  Upper Respiratory Infection: Patient complains of symptoms of a URI. Symptoms include congestion, cough and fever. Onset of symptoms was 2 days ago, unchanged since that time.  She has been taking DayQuil and Tylenol since then.  She states that fever has subsided, but continues to have dry cough and nasal congestion.  Of note, patient has a history of allergic sinusitis, for which she used to take Singulair in the past.  Patient denies any change in taste sensation.  She denies any known exposure to Covid positive person.  She has had 1 dose of Covid vaccine.    The patient does have  symptoms concerning for COVID-19 infection (fever, chills, cough, or new shortness of breath).   Past Medical, Surgical, Social History, Allergies, and Medications have been Reviewed.  Past Medical History:  Diagnosis Date  . Chronic back pain   . Gastritis   . Hemorrhoids   . Nicotine addiction   . Obesity   . Ovarian cyst    Past Surgical History:  Procedure Laterality Date  . btl and endometrial abaltion    . CHOLECYSTECTOMY  2007   Dr. Aviva Signs   . COLONOSCOPY   08/19/2008   Dr. Madolyn Frieze canal hemorrhoids.  Diminutive rectal polyp  status post cold biopsy and removal.  The remainder of rectal mucosa and colon was normal. Path: polypoid.   Marland Kitchen COLONOSCOPY WITH ESOPHAGOGASTRODUODENOSCOPY (EGD) N/A 07/08/2013   RMR: Rectal and colonic polyps-one tubular adenoma. colonic diverticulosis. Hemorroids-likely source of hematochezia.  NEXT TCS IN 7 YEARS. Small hiatal hernia . Antral erosions;nonbleeding duodenal AVM; status post gastric biopsy with severe H.pylori  . ESOPHAGOGASTRODUODENOSCOPY   08/19/2008   Dr. Rourk:Distal esophageal erosions, consistent with erosive reflux esophagitis/small HH/The remainder of her upper GI tract appeared normal  . HEMORRHOID SURGERY N/A 06/11/2019   Procedure: HEMORRHOIDECTOMY EXTENSIVE;  Surgeon: Virl Cagey, MD;  Location: [facility];  Service: General;  Laterality: N/A;  . left knee surgey- arthroscopy June 2011     Dr. Percell Miller   . right carpal tunnel release  1991  . Right Inguinal hernia repair  1982  . right knee surgery  1993  . TUBAL LIGATION  Current Meds  Medication Sig  . traZODone (DESYREL) 50 MG tablet Take 1 tablet (50 mg total) by mouth at bedtime as needed for sleep (Anxiety).     Allergies:   Ciprofloxacin, Flagyl [metronidazole], Oxycodone, and Amoxicillin   ROS:   Please see the history of present illness.     All other systems reviewed and are negative.   Labs/Other Tests and Data Reviewed:    Recent  Labs: 07/26/2019: BUN 7; Creat 0.65; Hemoglobin 13.4; Platelets 317; Potassium 4.1; Sodium 142; TSH 0.52   Recent Lipid Panel Lab Results  Component Value Date/Time   CHOL 225 (H) 07/26/2019 10:03 AM   TRIG 112 07/26/2019 10:03 AM   HDL 65 07/26/2019 10:03 AM   CHOLHDL 3.5 07/26/2019 10:03 AM   LDLCALC 137 (H) 07/26/2019 10:03 AM    Wt Readings from Last 3 Encounters:  03/14/20 157 lb 1.9 oz (71.3 kg)  10/28/19 164 lb (74.4 kg)  10/27/19 164 lb (74.4 kg)     ASSESSMENT & PLAN:    Upper respiratory tract infection Suspected Covid 19 infection Will schedule for Covid testing Advised to self quarantine for now Okay to take DayQuil for cough and congestion, Tylenol for fever/myalgias Benzonatate for cough Follow proper hand hygiene Advised to go to ER if she has shortness of breath, chest pain, palpitations or hemoptysis  Time:   Today, I have spent 10 minutes with the patient with telehealth technology discussing the above problems.     Medication Adjustments/Labs and Tests Ordered: Current medicines are reviewed at length with the patient today.  Concerns regarding medicines are outlined above.   Tests Ordered: No orders of the defined types were placed in this encounter.   Medication Changes: No orders of the defined types were placed in this encounter.    Note: This dictation was prepared with Dragon dictation along with smaller phrase technology. Similar sounding words can be transcribed inadequately or may not be corrected upon review. Any transcriptional errors that result from this process are unintentional.      Disposition:  Follow up  Signed, Lindell Spar, MD  03/29/2020 8:32 AM     [place]

## 2020-03-29 NOTE — Patient Instructions (Addendum)
You are being scheduled for COVID testing.  Please continue to take Dayquil for congestion and Tylenol for fever/myalgia.  Please continue to self-quarantine for now, until the COVID test result is back.  Upper Respiratory Infection, Adult An upper respiratory infection (URI) affects the nose, throat, and upper air passages. URIs are caused by germs (viruses). The most common type of URI is often called "the common cold." Medicines cannot cure URIs, but you can do things at home to relieve your symptoms. URIs usually get better within 7-10 days. Follow these instructions at home: Activity  Rest as needed.  If you have a fever, stay home from work or school until your fever is gone, or until your doctor says you may return to work or school. ? You should stay home until you cannot spread the infection anymore (you are not contagious). ? Your doctor may have you wear a face mask so you have less risk of spreading the infection. Relieving symptoms  Gargle with a salt-water mixture 3-4 times a day or as needed. To make a salt-water mixture, completely dissolve -1 tsp of salt in 1 cup of warm water.  Use a cool-mist humidifier to add moisture to the air. This can help you breathe more easily. Eating and drinking   Drink enough fluid to keep your pee (urine) pale yellow.  Eat soups and other clear broths. General instructions   Take over-the-counter and prescription medicines only as told by your doctor. These include cold medicines, fever reducers, and cough suppressants.  Do not use any products that contain nicotine or tobacco. These include cigarettes and e-cigarettes. If you need help quitting, ask your doctor.  Avoid being where people are smoking (avoid secondhand smoke).  Make sure you get regular shots and get the flu shot every year.  Keep all follow-up visits as told by your doctor. This is important. How to avoid spreading infection to others   Wash your hands often  with soap and water. If you do not have soap and water, use hand sanitizer.  Avoid touching your mouth, face, eyes, or nose.  Cough or sneeze into a tissue or your sleeve or elbow. Do not cough or sneeze into your hand or into the air. Contact a doctor if:  You are getting worse, not better.  You have any of these: ? A fever. ? Chills. ? Brown or red mucus in your nose. ? Yellow or brown fluid (discharge)coming from your nose. ? Pain in your face, especially when you bend forward. ? Swollen neck glands. ? Pain with swallowing. ? White areas in the back of your throat. Get help right away if:  You have shortness of breath that gets worse.  You have very bad or constant: ? Headache. ? Ear pain. ? Pain in your forehead, behind your eyes, and over your cheekbones (sinus pain). ? Chest pain.  You have long-lasting (chronic) lung disease along with any of these: ? Wheezing. ? Long-lasting cough. ? Coughing up blood. ? A change in your usual mucus.  You have a stiff neck.  You have changes in your: ? Vision. ? Hearing. ? Thinking. ? Mood. Summary  An upper respiratory infection (URI) is caused by a germ called a virus. The most common type of URI is often called "the common cold."  URIs usually get better within 7-10 days.  Take over-the-counter and prescription medicines only as told by your doctor. This information is not intended to replace advice given to you by  your health care provider. Make sure you discuss any questions you have with your health care provider. Document Revised: 05/21/2018 Document Reviewed: 01/03/2017 Elsevier Patient Education  [employer].

## 2020-03-30 ENCOUNTER — Other Ambulatory Visit: Payer: Self-pay | Admitting: *Deleted

## 2020-03-30 ENCOUNTER — Other Ambulatory Visit: Payer: Self-pay

## 2020-03-30 DIAGNOSIS — Z20822 Contact with and (suspected) exposure to covid-19: Secondary | ICD-10-CM

## 2020-03-31 LAB — NOVEL CORONAVIRUS, NAA: SARS-CoV-2, NAA: NOT DETECTED

## 2020-03-31 LAB — SARS-COV-2, NAA 2 DAY TAT

## 2020-04-03 ENCOUNTER — Telehealth: Payer: Self-pay | Admitting: *Deleted

## 2020-04-03 DIAGNOSIS — J069 Acute upper respiratory infection, unspecified: Secondary | ICD-10-CM

## 2020-04-03 NOTE — Telephone Encounter (Signed)
Pt called stating that since she did her visit on 11-3 with you she is still having a bad cough and now she is coughing up brown phlegm. Would like to know if you would like to send in antibiotic

## 2020-04-04 ENCOUNTER — Other Ambulatory Visit: Payer: Self-pay | Admitting: Internal Medicine

## 2020-04-04 ENCOUNTER — Telehealth: Payer: Self-pay | Admitting: Family Medicine

## 2020-04-04 DIAGNOSIS — N760 Acute vaginitis: Secondary | ICD-10-CM

## 2020-04-04 MED ORDER — FLUCONAZOLE 150 MG PO TABS: ORAL_TABLET | ORAL | 0 refills | Status: DC

## 2020-04-04 MED ORDER — AZITHROMYCIN 250 MG PO TABS: ORAL_TABLET | ORAL | 0 refills | Status: DC

## 2020-04-04 NOTE — Telephone Encounter (Signed)
Sent Azithromycin

## 2020-04-04 NOTE — Telephone Encounter (Signed)
Sent. Thanks.   

## 2020-04-04 NOTE — Telephone Encounter (Signed)
Pt stated she needed the diflucan sent in with the antibiotic as she knows what happens when she takes an antibiotic and she would need this when she picks it up. She stated Dr Moshe Cipro would normally just send them in together please advise

## 2020-04-04 NOTE — Telephone Encounter (Signed)
Patient would also like something to be sent in for yeast infection as everytime she takes antibiotic she gets yeast infection

## 2020-04-04 NOTE — Telephone Encounter (Signed)
Will call in prescription if she gets yeast infection. Thank you.

## 2020-04-04 NOTE — Telephone Encounter (Signed)
Patient requesting a script for diflucan as dr patel has her on a antibiotic p# [phone]

## 2020-04-04 NOTE — Telephone Encounter (Signed)
Pt notified with verbal understanding  °

## 2020-04-26 ENCOUNTER — Encounter: Payer: Self-pay | Admitting: Family Medicine

## 2020-04-26 ENCOUNTER — Ambulatory Visit (INDEPENDENT_AMBULATORY_CARE_PROVIDER_SITE_OTHER): Payer: Self-pay | Admitting: Nurse Practitioner

## 2020-04-26 ENCOUNTER — Encounter: Payer: Self-pay | Admitting: Nurse Practitioner

## 2020-04-26 ENCOUNTER — Other Ambulatory Visit: Payer: Self-pay

## 2020-04-26 VITALS — BP 119/78 | HR 84 | Temp 99.7°F | Resp 18 | Ht 64.0 in | Wt 155.0 lb

## 2020-04-26 DIAGNOSIS — R1084 Generalized abdominal pain: Secondary | ICD-10-CM

## 2020-04-26 NOTE — Assessment & Plan Note (Signed)
-  she has hx of reflux -taking protonix 20 mg daily -she would like H. Pylori breath testing, and we will perform that today -will consider med changes based on results of testing

## 2020-04-26 NOTE — Addendum Note (Signed)
Addended by: Demetrius Revel on: 04/26/2020 04:09 PM   Modules accepted: Orders

## 2020-04-26 NOTE — Progress Notes (Signed)
Acute Office Visit  Subjective:    Patient ID: Anna Cantu, female    DOB: 27-Oct-1965, 54 y.o.   MRN: [id number]    HPI Patient is in today for abdominal pain.  She states that her pain is intermittent.  She rates the pain as mild during the mornings.  She states she has some AM loose stools in the last few days.  She states that she has indigestion before and after meals.  She has tried taking prilosec as well as  Tums.  Her boyfriend was recently diagnosed with H. Pylori, and she would like to be tested as well.  Past Medical History:  Diagnosis Date  . Chronic back pain   . Gastritis   . Hemorrhoids   . Nicotine addiction   . Obesity   . Ovarian cyst     Past Surgical History:  Procedure Laterality Date  . btl and endometrial abaltion    . CHOLECYSTECTOMY  2007   Dr. Aviva Signs   . COLONOSCOPY   08/19/2008   Dr. Madolyn Frieze canal hemorrhoids.  Diminutive rectal polyp  status post cold biopsy and removal.  The remainder of rectal mucosa and colon was normal. Path: polypoid.   Marland Kitchen COLONOSCOPY WITH ESOPHAGOGASTRODUODENOSCOPY (EGD) N/A 07/08/2013   RMR: Rectal and colonic polyps-one tubular adenoma. colonic diverticulosis. Hemorroids-likely source of hematochezia.  NEXT TCS IN 7 YEARS. Small hiatal hernia . Antral erosions;nonbleeding duodenal AVM; status post gastric biopsy with severe H.pylori  . ESOPHAGOGASTRODUODENOSCOPY   08/19/2008   Dr. Rourk:Distal esophageal erosions, consistent with erosive reflux esophagitis/small HH/The remainder of her upper GI tract appeared normal  . HEMORRHOID SURGERY N/A 06/11/2019   Procedure: HEMORRHOIDECTOMY EXTENSIVE;  Surgeon: Virl Cagey, MD;  Location: [facility];  Service: General;  Laterality: N/A;  . left knee surgey- arthroscopy June 2011     Dr. Percell Miller   . right carpal tunnel release  1991  . Right Inguinal hernia repair  1982  . right knee surgery  1993  . TUBAL LIGATION      Family History  Problem Relation Age of  Onset  . Hypertension Mother   . Stroke Mother   . Hypertension Father   . Breast cancer Paternal Grandmother   . Colon cancer Neg Hx     Social History   Socioeconomic History  . Marital status: Divorced    Spouse name: Not on file  . Number of children: Not on file  . Years of education: Not on file  . Highest education level: Not on file  Occupational History  . Not on file  Tobacco Use  . Smoking status: Former Smoker    Packs/day: 0.50    Types: Cigarettes  . Smokeless tobacco: Never Used  . Tobacco comment: quit Mar 03, 2013   Substance and Sexual Activity  . Alcohol use: Yes    Comment: occasionally  . Drug use: No  . Sexual activity: Yes    Birth control/protection: Surgical  Other Topics Concern  . Not on file  Social History Narrative  . Not on file   Social Determinants of Health   Financial Resource Strain:   . Difficulty of Paying Living Expenses: Not on file  Food Insecurity:   . Worried About Charity fundraiser in the Last Year: Not on file  . Ran Out of Food in the Last Year: Not on file  Transportation Needs:   . Lack of Transportation (Medical): Not on file  . Lack of Transportation (  Non-Medical): Not on file  Physical Activity:   . Days of Exercise per Week: Not on file  . Minutes of Exercise per Session: Not on file  Stress:   . Feeling of Stress : Not on file  Social Connections:   . Frequency of Communication with Friends and Family: Not on file  . Frequency of Social Gatherings with Friends and Family: Not on file  . Attends Religious Services: Not on file  . Active Member of Clubs or Organizations: Not on file  . Attends Archivist Meetings: Not on file  . Marital Status: Not on file  Intimate Partner Violence:   . Fear of Current or Ex-Partner: Not on file  . Emotionally Abused: Not on file  . Physically Abused: Not on file  . Sexually Abused: Not on file    Outpatient Medications Prior to Visit  Medication Sig  Dispense Refill  . acetaminophen (TYLENOL) 500 MG tablet Take 1 tablet (500 mg total) by mouth every 6 (six) hours as needed. 30 tablet 0  . Ascorbic Acid (VITAMIN C) 1000 MG tablet Take 1,000 mg by mouth daily.     . Biotin 5000 MCG TABS Take 5,000 mcg by mouth daily.     . cholecalciferol (VITAMIN D3) 25 MCG (1000 UT) tablet Take 1,000 Units by mouth daily.     . diclofenac sodium (VOLTAREN) 1 % GEL Apply twice daily to affected areas 100 g 3  . docusate sodium (COLACE) 100 MG capsule Take 1 capsule (100 mg total) by mouth 2 (two) times daily. 60 capsule 2  . ELDERBERRY PO Take 1 Scoop by mouth daily.     . hyoscyamine (LEVBID) 0.375 MG 12 hr tablet Take 1 tablet (0.375 mg total) by mouth every 12 (twelve) hours as needed. 30 tablet 0  . ibuprofen (ADVIL,MOTRIN) 800 MG tablet Take 1 tablet (800 mg total) by mouth every 8 (eight) hours as needed. Max 3 tabs a week 30 tablet 1  . Magnesium 100 MG CAPS Take 100 mg by mouth daily as needed (cramps).     . montelukast (SINGULAIR) 10 MG tablet Take 1 tablet (10 mg total) by mouth at bedtime. 30 tablet 3  . Multiple Vitamin (MULITIVITAMIN WITH MINERALS) TABS Take 1 tablet by mouth daily.     . pantoprazole (PROTONIX) 20 MG tablet Take 1 tablet (20 mg total) by mouth daily. 90 tablet 0  . tiZANidine (ZANAFLEX) 4 MG capsule Take 1 capsule (4 mg total) by mouth 3 (three) times daily. 60 capsule 2  . traZODone (DESYREL) 50 MG tablet Take 1 tablet (50 mg total) by mouth at bedtime as needed for sleep (Anxiety). 30 tablet 3  . Vitamin A 2400 MCG (8000 UT) CAPS Take 8,000 Units by mouth daily.     . vitamin B-12 (CYANOCOBALAMIN) 500 MCG tablet Take 500 mcg by mouth daily.     . Zinc 50 MG TABS Take 50 mg by mouth daily.     Marland Kitchen azithromycin (ZITHROMAX) 250 MG tablet Take as package instructions. 6 tablet 0  . benzonatate (TESSALON) 100 MG capsule Take 1 capsule (100 mg total) by mouth 2 (two) times daily as needed for cough. 20 capsule 0  . fluconazole  (DIFLUCAN) 150 MG tablet Take one tablet once daily as needed, for vaginal itch 2 tablet 0   No facility-administered medications prior to visit.    Allergies  Allergen Reactions  . Ciprofloxacin     ?rash related to cipro or flagyl vs  other reason  . Flagyl [Metronidazole]     ?rash to cipro or flagyl vs other reason  . Oxycodone     Nausea, makes her feel sick  . Amoxicillin Itching and Rash    Did it involve swelling of the face/tongue/throat, SOB, or low BP? No Did it involve sudden or severe rash/hives, skin peeling, or any reaction on the inside of your mouth or nose? No Did you need to seek medical attention at a hospital or doctor's office? No When did it last happen? If all above answers are "NO", may proceed with cephalosporin use.     Review of Systems  Gastrointestinal: Positive for abdominal pain. Negative for blood in stool, constipation, nausea and vomiting.       Objective:    Physical Exam Constitutional:      Appearance: She is well-developed.  Cardiovascular:     Rate and Rhythm: Normal rate and regular rhythm.  Abdominal:     General: Bowel sounds are normal.     Palpations: Abdomen is soft.     Tenderness: There is abdominal tenderness in the right upper quadrant and left upper quadrant.     Comments: Mild tenderness, no guarding  Neurological:     Mental Status: She is alert.     BP 119/78   Pulse 84   Temp 99.7 F (37.6 C)   Resp 18   Ht 5\' 4"  (1.626 m)   Wt 155 lb (70.3 kg)   LMP 07/01/2013   SpO2 97%   BMI 26.61 kg/m  Wt Readings from Last 3 Encounters:  04/26/20 155 lb (70.3 kg)  03/14/20 157 lb 1.9 oz (71.3 kg)  10/28/19 164 lb (74.4 kg)    Health Maintenance Due  Topic Date Due  . COVID-19 Vaccine (1) Never done  . INFLUENZA VACCINE  12/26/2019  . PAP SMEAR-Modifier  04/04/2020    There are no preventive care reminders to display for this patient.   Lab Results  Component Value Date   TSH 0.52 07/26/2019    Lab Results  Component Value Date   WBC 9.2 07/26/2019   HGB 13.4 07/26/2019   HCT 39.6 07/26/2019   MCV 95.2 07/26/2019   PLT 317 07/26/2019   Lab Results  Component Value Date   NA 142 07/26/2019   K 4.1 07/26/2019   CO2 27 07/26/2019   GLUCOSE 107 (H) 07/26/2019   BUN 7 07/26/2019   CREATININE 0.65 07/26/2019   BILITOT 0.3 01/18/2015   ALKPHOS 75 01/18/2015   AST 14 01/18/2015   ALT 16 01/18/2015   PROT 6.5 01/18/2015   ALBUMIN 4.1 01/18/2015   CALCIUM 9.7 07/26/2019   ANIONGAP 11 06/25/2017   Lab Results  Component Value Date   CHOL 225 (H) 07/26/2019   Lab Results  Component Value Date   HDL 65 07/26/2019   Lab Results  Component Value Date   LDLCALC 137 (H) 07/26/2019   Lab Results  Component Value Date   TRIG 112 07/26/2019   Lab Results  Component Value Date   CHOLHDL 3.5 07/26/2019   Lab Results  Component Value Date   HGBA1C 5.4 01/18/2015       Assessment & Plan:   Problem List Items Addressed This Visit      Other   Abdominal pain - Primary    -she has hx of reflux -taking protonix 20 mg daily -she would like H. Pylori breath testing, and we will perform that today -will consider med changes  based on results of testing          No orders of the defined types were placed in this encounter.    Noreene Larsson, NP

## 2020-04-28 ENCOUNTER — Encounter: Payer: Self-pay | Admitting: Family Medicine

## 2020-04-28 ENCOUNTER — Other Ambulatory Visit: Payer: Self-pay | Admitting: Nurse Practitioner

## 2020-04-28 DIAGNOSIS — R1084 Generalized abdominal pain: Secondary | ICD-10-CM

## 2020-04-28 LAB — H PYLORI, IGM, IGG, IGA AB
H pylori, IgM Abs: <9 units (ref 0.0–8.9)
H. pylori, IgA Abs: <9 units (ref 0.0–8.9)
H. pylori, IgG AbS: 2.61 Index Value — ABNORMAL HIGH (ref 0.00–0.79)

## 2020-04-28 NOTE — Telephone Encounter (Signed)
Likely she had an exposure and now has antibodies, but serum testing isn't the most accurate, so I will refer her to GI to determine if further testing is required.

## 2020-05-01 ENCOUNTER — Encounter: Payer: Self-pay | Admitting: Internal Medicine

## 2020-05-10 ENCOUNTER — Encounter: Payer: Self-pay | Admitting: Family Medicine

## 2020-05-12 ENCOUNTER — Other Ambulatory Visit: Payer: Self-pay | Admitting: Family Medicine

## 2020-05-17 ENCOUNTER — Other Ambulatory Visit: Payer: Self-pay | Admitting: Family Medicine

## 2020-05-17 MED ORDER — GABAPENTIN 100 MG PO CAPS: 100.0000 mg | ORAL_CAPSULE | Freq: Every day | ORAL | 0 refills | Status: DC

## 2020-05-23 ENCOUNTER — Encounter: Payer: Self-pay | Admitting: Nurse Practitioner

## 2020-05-23 ENCOUNTER — Ambulatory Visit (INDEPENDENT_AMBULATORY_CARE_PROVIDER_SITE_OTHER): Payer: Self-pay | Admitting: Nurse Practitioner

## 2020-05-23 ENCOUNTER — Other Ambulatory Visit: Payer: Self-pay

## 2020-05-23 VITALS — BP 111/74 | HR 88 | Temp 99.3°F | Resp 20 | Ht 64.0 in | Wt 150.0 lb

## 2020-05-23 DIAGNOSIS — M5432 Sciatica, left side: Secondary | ICD-10-CM

## 2020-05-23 MED ORDER — KETOROLAC TROMETHAMINE 10 MG PO TABS
10.0000 mg | ORAL_TABLET | Freq: Three times a day (TID) | ORAL | 0 refills | Status: DC | PRN
Start: 2020-05-23 — End: 2021-05-17

## 2020-05-23 MED ORDER — METHYLPREDNISOLONE ACETATE 80 MG/ML IJ SUSP
80.0000 mg | Freq: Once | INTRAMUSCULAR | Status: AC
  Administered 2020-05-23: 14:00:00 80 mg via INTRAMUSCULAR

## 2020-05-23 NOTE — Assessment & Plan Note (Signed)
-  positive left straight leg raise today -she has prescribed tizanidine -Rx. Toradol; STOP ibuprofen while on toradol -IM depomedrol today -if no improvement in 5 days, return to clinic; would consider ortho consult at that time

## 2020-05-23 NOTE — Progress Notes (Signed)
Acute Office Visit  Subjective:    Patient ID: Anna Cantu, female    DOB: [birth year], 54 y.o.   MRN: [id number]  Chief Complaint  Patient presents with  . Back Pain  . Leg Pain    HPI Patient is in today for back and leg pain.  She works at [employer].  She states her pain is a 20 out of 10.  Her pain starts in her lower back and radiates into her left leg.  She has tried capsaicin cream, and that is not helping.  She has tried stretching, but she is having too much pain.  She was seeing ortho previously, but states her insurance does not kick in until next month.  Past Medical History:  Diagnosis Date  . Chronic back pain   . Gastritis   . Hemorrhoids   . Nicotine addiction   . Obesity   . Ovarian cyst     Past Surgical History:  Procedure Laterality Date  . btl and endometrial abaltion    . CHOLECYSTECTOMY  2007   Dr. Franky Macho   . COLONOSCOPY   08/19/2008   Dr. Marlowe Alt canal hemorrhoids.  Diminutive rectal polyp  status post cold biopsy and removal.  The remainder of rectal mucosa and colon was normal. Path: polypoid.   Marland Kitchen COLONOSCOPY WITH ESOPHAGOGASTRODUODENOSCOPY (EGD) N/A 07/08/2013   RMR: Rectal and colonic polyps-one tubular adenoma. colonic diverticulosis. Hemorroids-likely source of hematochezia.  NEXT TCS IN 7 YEARS. Small hiatal hernia . Antral erosions;nonbleeding duodenal AVM; status post gastric biopsy with severe H.pylori  . ESOPHAGOGASTRODUODENOSCOPY   08/19/2008   Dr. Rourk:Distal esophageal erosions, consistent with erosive reflux esophagitis/small HH/The remainder of her upper GI tract appeared normal  . HEMORRHOID SURGERY N/A 06/11/2019   Procedure: HEMORRHOIDECTOMY EXTENSIVE;  Surgeon: Lucretia Roers, MD;  Location: [facility];  Service: General;  Laterality: N/A;  . left knee surgey- arthroscopy June 2011     Dr. Eulah Pont   . right carpal tunnel release  1991  . Right Inguinal hernia repair  1982  . right knee surgery  1993  . TUBAL  LIGATION      Family History  Problem Relation Age of Onset  . Hypertension Mother   . Stroke Mother   . Hypertension Father   . Breast cancer Paternal Grandmother   . Colon cancer Neg Hx     Social History   Socioeconomic History  . Marital status: Divorced    Spouse name: Not on file  . Number of children: Not on file  . Years of education: Not on file  . Highest education level: Not on file  Occupational History  . Not on file  Tobacco Use  . Smoking status: Former Smoker    Packs/day: 0.50    Types: Cigarettes  . Smokeless tobacco: Never Used  . Tobacco comment: quit Mar 03, 2013   Substance and Sexual Activity  . Alcohol use: Yes    Comment: occasionally  . Drug use: No  . Sexual activity: Yes    Birth control/protection: Surgical  Other Topics Concern  . Not on file  Social History Narrative  . Not on file   Social Determinants of Health   Financial Resource Strain: Not on file  Food Insecurity: Not on file  Transportation Needs: Not on file  Physical Activity: Not on file  Stress: Not on file  Social Connections: Not on file  Intimate Partner Violence: Not on file    Outpatient Medications Prior to  Visit  Medication Sig Dispense Refill  . acetaminophen (TYLENOL) 500 MG tablet Take 1 tablet (500 mg total) by mouth every 6 (six) hours as needed. 30 tablet 0  . Ascorbic Acid (VITAMIN C) 1000 MG tablet Take 1,000 mg by mouth daily.     . Biotin 5000 MCG TABS Take 5,000 mcg by mouth daily.     . cholecalciferol (VITAMIN D3) 25 MCG (1000 UT) tablet Take 1,000 Units by mouth daily.     . diclofenac sodium (VOLTAREN) 1 % GEL Apply twice daily to affected areas 100 g 3  . docusate sodium (COLACE) 100 MG capsule Take 1 capsule (100 mg total) by mouth 2 (two) times daily. 60 capsule 2  . ELDERBERRY PO Take 1 Scoop by mouth daily.     . hyoscyamine (LEVBID) 0.375 MG 12 hr tablet Take 1 tablet (0.375 mg total) by mouth every 12 (twelve) hours as needed. 30 tablet 0   . ibuprofen (ADVIL,MOTRIN) 800 MG tablet Take 1 tablet (800 mg total) by mouth every 8 (eight) hours as needed. Max 3 tabs a week 30 tablet 1  . Magnesium 100 MG CAPS Take 100 mg by mouth daily as needed (cramps).     . montelukast (SINGULAIR) 10 MG tablet Take 1 tablet (10 mg total) by mouth at bedtime. 30 tablet 3  . Multiple Vitamin (MULITIVITAMIN WITH MINERALS) TABS Take 1 tablet by mouth daily.     . pantoprazole (PROTONIX) 20 MG tablet Take 1 tablet (20 mg total) by mouth daily. 90 tablet 0  . tiZANidine (ZANAFLEX) 4 MG capsule Take 1 capsule (4 mg total) by mouth 3 (three) times daily. 60 capsule 2  . Vitamin A 2400 MCG (8000 UT) CAPS Take 8,000 Units by mouth daily.     . vitamin B-12 (CYANOCOBALAMIN) 500 MCG tablet Take 500 mcg by mouth daily.     . Zinc 50 MG TABS Take 50 mg by mouth daily.     Marland Kitchen ketorolac (TORADOL) 10 MG tablet Take 10 mg by mouth every 8 (eight) hours as needed for moderate pain or severe pain.    Marland Kitchen gabapentin (NEURONTIN) 100 MG capsule Take 1 capsule (100 mg total) by mouth at bedtime. (Patient not taking: Reported on 05/23/2020) 30 capsule 0  . traZODone (DESYREL) 50 MG tablet Take 1 tablet (50 mg total) by mouth at bedtime as needed for sleep (Anxiety). (Patient not taking: Reported on 05/23/2020) 30 tablet 3   No facility-administered medications prior to visit.    Allergies  Allergen Reactions  . Ciprofloxacin     ?rash related to cipro or flagyl vs other reason  . Flagyl [Metronidazole]     ?rash to cipro or flagyl vs other reason  . Oxycodone     Nausea, makes her feel sick  . Amoxicillin Itching and Rash    Did it involve swelling of the face/tongue/throat, SOB, or low BP? No Did it involve sudden or severe rash/hives, skin peeling, or any reaction on the inside of your mouth or nose? No Did you need to seek medical attention at a hospital or doctor's office? No When did it last happen? If all above answers are "NO", may proceed with  cephalosporin use.     Review of Systems  Constitutional: Negative.   Respiratory: Negative.   Cardiovascular: Negative.   Musculoskeletal: Positive for arthralgias and back pain.       Objective:    Physical Exam Constitutional:      Appearance: Normal appearance.  Musculoskeletal:        General: Tenderness present. No swelling or deformity.     Comments: To lower back, positive left straight leg raise  Neurological:     Mental Status: She is alert.     BP 111/74   Pulse 88   Temp 99.3 F (37.4 C)   Resp 20   Ht 5\' 4"  (1.626 m)   Wt 150 lb (68 kg)   LMP 07/01/2013   SpO2 97%   BMI 25.75 kg/m  Wt Readings from Last 3 Encounters:  05/23/20 150 lb (68 kg)  04/26/20 155 lb (70.3 kg)  03/14/20 157 lb 1.9 oz (71.3 kg)    Health Maintenance Due  Topic Date Due  . COVID-19 Vaccine (1) Never done  . INFLUENZA VACCINE  12/26/2019  . PAP SMEAR-Modifier  04/04/2020    There are no preventive care reminders to display for this patient.   Lab Results  Component Value Date   TSH 0.52 07/26/2019   Lab Results  Component Value Date   WBC 9.2 07/26/2019   HGB 13.4 07/26/2019   HCT 39.6 07/26/2019   MCV 95.2 07/26/2019   PLT 317 07/26/2019   Lab Results  Component Value Date   NA 142 07/26/2019   K 4.1 07/26/2019   CO2 27 07/26/2019   GLUCOSE 107 (H) 07/26/2019   BUN 7 07/26/2019   CREATININE 0.65 07/26/2019   BILITOT 0.3 01/18/2015   ALKPHOS 75 01/18/2015   AST 14 01/18/2015   ALT 16 01/18/2015   PROT 6.5 01/18/2015   ALBUMIN 4.1 01/18/2015   CALCIUM 9.7 07/26/2019   ANIONGAP 11 06/25/2017   Lab Results  Component Value Date   CHOL 225 (H) 07/26/2019   Lab Results  Component Value Date   HDL 65 07/26/2019   Lab Results  Component Value Date   LDLCALC 137 (H) 07/26/2019   Lab Results  Component Value Date   TRIG 112 07/26/2019   Lab Results  Component Value Date   CHOLHDL 3.5 07/26/2019   Lab Results  Component Value Date   HGBA1C  5.4 01/18/2015       Assessment & Plan:   Problem List Items Addressed This Visit      Nervous and Auditory   Sciatica of left side - Primary    -positive left straight leg raise today -she has prescribed tizanidine -Rx. Toradol; STOP ibuprofen while on toradol -IM depomedrol today -if no improvement in 5 days, return to clinic; would consider ortho consult at that time      Relevant Medications   methylPREDNISolone acetate (DEPO-MEDROL) injection 80 mg (Start on 05/23/2020 12:00 PM)       Meds ordered this encounter  Medications  . ketorolac (TORADOL) 10 MG tablet    Sig: Take 1 tablet (10 mg total) by mouth every 8 (eight) hours as needed for moderate pain or severe pain. Do NOT take ibuprofen or other NSAIDs while using this medicaiton.    Dispense:  15 tablet    Refill:  0  . methylPREDNISolone acetate (DEPO-MEDROL) injection 80 mg     Noreene Larsson, NP

## 2020-05-23 NOTE — Patient Instructions (Signed)
You received a depo-medrol injection today and I prescribed ketorolac for your pain. Please take this as prescribed. Do not take ibuprofen or other NSAIDs while taking ketorolac.  If you don't feel better after the ketorolac runs out, please return to the clinic and we can discuss an ortho referral.

## 2020-06-22 ENCOUNTER — Other Ambulatory Visit: Payer: Self-pay

## 2020-06-22 ENCOUNTER — Ambulatory Visit: Payer: Self-pay | Admitting: Nurse Practitioner

## 2020-06-22 NOTE — Progress Notes (Deleted)
Referring Provider: Noreene Larsson, NP Primary Care Physician:  Fayrene Helper, MD Primary GI:  Dr. Gala Romney  No chief complaint on file.   HPI:   Anna Cantu is a 55 y.o. female who presents for abdominal pain and due for TCS.  The patient was last seen in our office 07/09/2017 for diarrhea, GERD, hemorrhoids, left lower quadrant pain, H. pylori infection, rectal bleeding.  Noted at that time diarrhea 10 days prior suspected gastroenteritis which resolved for 2 days but then restarted.  Noted 5 stools a day at that time.  History of H. pylori treatment failure and referred to ID several years ago but never went.  Allergic to amoxicillin, but at some point does have been removed from her allergy list.  She was initially treated for H. pylori with Prevacid, Biaxin, Flagyl but she did not complete the regimen.  Recommended topical therapy for hemorrhoids, collect stool antigen for H. pylori, start Cipro/Flagyl for diarrhea and pantoprazole for reflux after collecting stool sample, follow-up in 4 weeks.  Stool antigen for H. pylori 07/12/2017 was negative.  However, she called our office indicating after starting Cipro and Flagyl on the 6 states she programmable W rash and took 2 Benadryl.  Also with some flulike symptoms.  Recommended follow-up with primary care related to possible viral infection and rash possibly viral.  Recommended she stop her antibiotics for now.  It does not appear the patient ever collected her stool samples as recommended.  She did not follow-up with an office visit as recommended.  Previous colonoscopy completed 07/08/2013 which found adequate preparation.  A single 5 mm rectal polyp and 2 diminutive polyps in the descending segment that were removed.  Noted colonic diverticulosis.  Hemorrhoids is likely source of hematochezia.  Surgical pathology found the polyps to be a mix of hyperplastic and tubular adenoma and recommended repeat colonoscopy in 7 years  (2022).  Today she states she is doing okay overall.  Past Medical History:  Diagnosis Date  . Chronic back pain   . Gastritis   . Hemorrhoids   . Nicotine addiction   . Obesity   . Ovarian cyst     Past Surgical History:  Procedure Laterality Date  . btl and endometrial abaltion    . CHOLECYSTECTOMY  2007   Dr. Aviva Signs   . COLONOSCOPY   08/19/2008   Dr. Madolyn Frieze canal hemorrhoids.  Diminutive rectal polyp  status post cold biopsy and removal.  The remainder of rectal mucosa and colon was normal. Path: polypoid.   Marland Kitchen COLONOSCOPY WITH ESOPHAGOGASTRODUODENOSCOPY (EGD) N/A 07/08/2013   RMR: Rectal and colonic polyps-one tubular adenoma. colonic diverticulosis. Hemorroids-likely source of hematochezia.  NEXT TCS IN 7 YEARS. Small hiatal hernia . Antral erosions;nonbleeding duodenal AVM; status post gastric biopsy with severe H.pylori  . ESOPHAGOGASTRODUODENOSCOPY   08/19/2008   Dr. Rourk:Distal esophageal erosions, consistent with erosive reflux esophagitis/small HH/The remainder of her upper GI tract appeared normal  . HEMORRHOID SURGERY N/A 06/11/2019   Procedure: HEMORRHOIDECTOMY EXTENSIVE;  Surgeon: Virl Cagey, MD;  Location: [facility];  Service: General;  Laterality: N/A;  . left knee surgey- arthroscopy June 2011     Dr. Percell Miller   . right carpal tunnel release  1991  . Right Inguinal hernia repair  1982  . right knee surgery  1993  . TUBAL LIGATION      Current Outpatient Medications  Medication Sig Dispense Refill  . acetaminophen (TYLENOL) 500 MG tablet Take 1 tablet (  500 mg total) by mouth every 6 (six) hours as needed. 30 tablet 0  . Ascorbic Acid (VITAMIN C) 1000 MG tablet Take 1,000 mg by mouth daily.     . Biotin 5000 MCG TABS Take 5,000 mcg by mouth daily.     . cholecalciferol (VITAMIN D3) 25 MCG (1000 UT) tablet Take 1,000 Units by mouth daily.     . diclofenac sodium (VOLTAREN) 1 % GEL Apply twice daily to affected areas 100 g 3  . ELDERBERRY PO Take 1  Scoop by mouth daily.     Marland Kitchen gabapentin (NEURONTIN) 100 MG capsule Take 1 capsule (100 mg total) by mouth at bedtime. (Patient not taking: Reported on 05/23/2020) 30 capsule 0  . hyoscyamine (LEVBID) 0.375 MG 12 hr tablet Take 1 tablet (0.375 mg total) by mouth every 12 (twelve) hours as needed. 30 tablet 0  . ibuprofen (ADVIL,MOTRIN) 800 MG tablet Take 1 tablet (800 mg total) by mouth every 8 (eight) hours as needed. Max 3 tabs a week 30 tablet 1  . ketorolac (TORADOL) 10 MG tablet Take 1 tablet (10 mg total) by mouth every 8 (eight) hours as needed for moderate pain or severe pain. Do NOT take ibuprofen or other NSAIDs while using this medicaiton. 15 tablet 0  . Magnesium 100 MG CAPS Take 100 mg by mouth daily as needed (cramps).     . montelukast (SINGULAIR) 10 MG tablet Take 1 tablet (10 mg total) by mouth at bedtime. 30 tablet 3  . Multiple Vitamin (MULITIVITAMIN WITH MINERALS) TABS Take 1 tablet by mouth daily.     . pantoprazole (PROTONIX) 20 MG tablet Take 1 tablet (20 mg total) by mouth daily. 90 tablet 0  . tiZANidine (ZANAFLEX) 4 MG capsule Take 1 capsule (4 mg total) by mouth 3 (three) times daily. 60 capsule 2  . traZODone (DESYREL) 50 MG tablet Take 1 tablet (50 mg total) by mouth at bedtime as needed for sleep (Anxiety). (Patient not taking: Reported on 05/23/2020) 30 tablet 3  . Vitamin A 2400 MCG (8000 UT) CAPS Take 8,000 Units by mouth daily.     . vitamin B-12 (CYANOCOBALAMIN) 500 MCG tablet Take 500 mcg by mouth daily.     . Zinc 50 MG TABS Take 50 mg by mouth daily.      No current facility-administered medications for this visit.    Allergies as of 06/22/2020 - Review Complete 05/23/2020  Allergen Reaction Noted  . Ciprofloxacin  07/16/2017  . Flagyl [metronidazole]  07/16/2017  . Oxycodone  07/13/2019  . Amoxicillin Itching and Rash 09/16/2011    Family History  Problem Relation Age of Onset  . Hypertension Mother   . Stroke Mother   . Hypertension Father   .  Breast cancer Paternal Grandmother   . Colon cancer Neg Hx     Social History   Socioeconomic History  . Marital status: Divorced    Spouse name: Not on file  . Number of children: Not on file  . Years of education: Not on file  . Highest education level: Not on file  Occupational History  . Not on file  Tobacco Use  . Smoking status: Former Smoker    Packs/day: 0.50    Types: Cigarettes  . Smokeless tobacco: Never Used  . Tobacco comment: quit Mar 03, 2013   Substance and Sexual Activity  . Alcohol use: Yes    Comment: occasionally  . Drug use: No  . Sexual activity: Yes  Birth control/protection: Surgical  Other Topics Concern  . Not on file  Social History Narrative  . Not on file   Social Determinants of Health   Financial Resource Strain: Not on file  Food Insecurity: Not on file  Transportation Needs: Not on file  Physical Activity: Not on file  Stress: Not on file  Social Connections: Not on file    Subjective:*** Review of Systems  Constitutional: Negative for chills, fever, malaise/fatigue and weight loss.  HENT: Negative for congestion and sore throat.   Respiratory: Negative for cough and shortness of breath.   Cardiovascular: Negative for chest pain and palpitations.  Gastrointestinal: Negative for abdominal pain, blood in stool, diarrhea, melena, nausea and vomiting.  Musculoskeletal: Negative for joint pain and myalgias.  Skin: Negative for rash.  Neurological: Negative for dizziness and weakness.  Endo/Heme/Allergies: Does not bruise/bleed easily.  Psychiatric/Behavioral: Negative for depression. The patient is not nervous/anxious.   All other systems reviewed and are negative.    Objective: LMP 07/01/2013  Physical Exam Vitals and nursing note reviewed.  Constitutional:      General: She is not in acute distress.    Appearance: Normal appearance. She is well-developed. She is not ill-appearing, toxic-appearing or diaphoretic.  HENT:      Head: Normocephalic and atraumatic.     Nose: No congestion or rhinorrhea.  Eyes:     General: No scleral icterus. Cardiovascular:     Rate and Rhythm: Normal rate and regular rhythm.     Heart sounds: Normal heart sounds.  Pulmonary:     Effort: Pulmonary effort is normal. No respiratory distress.     Breath sounds: Normal breath sounds.  Abdominal:     General: Bowel sounds are normal.     Palpations: Abdomen is soft. There is no hepatomegaly, splenomegaly or mass.     Tenderness: There is no abdominal tenderness. There is no guarding or rebound.     Hernia: No hernia is present.  Skin:    General: Skin is warm and dry.     Coloration: Skin is not jaundiced.     Findings: No rash.  Neurological:     General: No focal deficit present.     Mental Status: She is alert and oriented to person, place, and time.  Psychiatric:        Attention and Perception: Attention normal.        Mood and Affect: Mood normal.        Speech: Speech normal.        Behavior: Behavior normal.        Thought Content: Thought content normal.        Cognition and Memory: Cognition and memory normal.      Assessment:  ***   Plan: ***    Thank you for allowing [place] to participate in the care of [place], DNP, AGNP-C Adult & Gerontological Nurse Practitioner [facility]   06/22/2020 7:51 AM   Disclaimer: This note was dictated with voice recognition software. Similar sounding words can inadvertently be transcribed and may not be corrected upon review.

## 2021-05-17 ENCOUNTER — Other Ambulatory Visit: Payer: Self-pay

## 2021-05-17 ENCOUNTER — Ambulatory Visit: Payer: 59 | Admitting: Family Medicine

## 2021-05-17 ENCOUNTER — Encounter: Payer: Self-pay | Admitting: Family Medicine

## 2021-05-17 ENCOUNTER — Telehealth: Payer: Self-pay | Admitting: Family Medicine

## 2021-05-17 ENCOUNTER — Other Ambulatory Visit (HOSPITAL_COMMUNITY)
Admission: RE | Admit: 2021-05-17 | Discharge: 2021-05-17 | Disposition: A | Discharge status: Home / Self Care | Payer: 59 | Source: Ambulatory Visit | Attending: Family Medicine | Admitting: Family Medicine

## 2021-05-17 VITALS — BP 118/74 | HR 76 | Resp 16 | Ht 64.0 in | Wt 158.0 lb

## 2021-05-17 DIAGNOSIS — Z13220 Encounter for screening for lipoid disorders: Secondary | ICD-10-CM

## 2021-05-17 DIAGNOSIS — E6609 Other obesity due to excess calories: Secondary | ICD-10-CM | POA: Diagnosis not present

## 2021-05-17 DIAGNOSIS — Z0000 Encounter for general adult medical examination without abnormal findings: Secondary | ICD-10-CM

## 2021-05-17 DIAGNOSIS — G8929 Other chronic pain: Secondary | ICD-10-CM

## 2021-05-17 DIAGNOSIS — N760 Acute vaginitis: Secondary | ICD-10-CM | POA: Insufficient documentation

## 2021-05-17 DIAGNOSIS — M5440 Lumbago with sciatica, unspecified side: Secondary | ICD-10-CM

## 2021-05-17 DIAGNOSIS — Z124 Encounter for screening for malignant neoplasm of cervix: Secondary | ICD-10-CM | POA: Diagnosis not present

## 2021-05-17 DIAGNOSIS — E559 Vitamin D deficiency, unspecified: Secondary | ICD-10-CM

## 2021-05-17 DIAGNOSIS — M5432 Sciatica, left side: Secondary | ICD-10-CM | POA: Diagnosis not present

## 2021-05-17 DIAGNOSIS — Z0001 Encounter for general adult medical examination with abnormal findings: Secondary | ICD-10-CM | POA: Diagnosis not present

## 2021-05-17 DIAGNOSIS — Z1231 Encounter for screening mammogram for malignant neoplasm of breast: Secondary | ICD-10-CM

## 2021-05-17 DIAGNOSIS — Z6831 Body mass index (BMI) 31.0-31.9, adult: Secondary | ICD-10-CM

## 2021-05-17 MED ORDER — PREDNISONE 10 MG PO TABS: 10.0000 mg | ORAL_TABLET | Freq: Two times a day (BID) | ORAL | 0 refills | Status: DC

## 2021-05-17 MED ORDER — IBUPROFEN 800 MG PO TABS: 800.0000 mg | ORAL_TABLET | Freq: Three times a day (TID) | ORAL | 0 refills | Status: DC | PRN

## 2021-05-17 MED ORDER — KETOROLAC TROMETHAMINE 60 MG/2ML IM SOLN
60.0000 mg | Freq: Once | INTRAMUSCULAR | Status: AC
Start: 2021-05-17 — End: 2021-05-17
  Administered 2021-05-17: 12:00:00 60 mg via INTRAMUSCULAR

## 2021-05-17 MED ORDER — METHYLPREDNISOLONE ACETATE 80 MG/ML IJ SUSP
80.0000 mg | Freq: Once | INTRAMUSCULAR | Status: AC
  Administered 2021-05-17: 12:00:00 80 mg via INTRAMUSCULAR

## 2021-05-17 MED ORDER — DICLOFENAC SODIUM 1 % EX GEL: 2.0000 g | Freq: Four times a day (QID) | CUTANEOUS | 1 refills | Status: DC

## 2021-05-17 NOTE — Telephone Encounter (Signed)
The mammo was not in the system. However we couldn't get the mammo scheduled before she left  to go back to [place] --she will call when she is coming back in town  --

## 2021-05-17 NOTE — Assessment & Plan Note (Signed)
Uncontrolled.Toradol and depo medrol administered IM in the office , to be followed by a short course of oral prednisone   

## 2021-05-17 NOTE — Patient Instructions (Addendum)
F/U in 6 months , call if you need me soonber  Pap today  Please arrange mammogram in the next 1 week if possiblew  cBC, lipid, chem 7 and EGFR and TSH  Toradol 60 an depo medrol 80 IM   Ibuprofen tabs and voltaren gel are prescribed for pain, do not use both on the same day.  Tylenol arthritis 2 to 3 per day for pain is recommended  Thanks for choosing [facility], we consider it a privelige to serve you.

## 2021-05-17 NOTE — Progress Notes (Signed)
° ° °  Anna Cantu     MRN: [id number]      DOB: Sep 30, 1965  HPI: Patient is in for annual physical exam. C/o uncontrolled back and hip pain x 1 week, requests injections for this and medication Wants STD testing. Immunization is reviewed , and  updated if needed.   PE: BP 118/74    Pulse 76    Resp 16    Ht 5\' 4"  (1.626 m)    Wt 158 lb 0.6 oz (71.7 kg)    LMP 07/01/2013    SpO2 95%    BMI 27.13 kg/m   Pleasant  female, alert and oriented x 3, in no cardio-pulmonary distress. Afebrile. HEENT No facial trauma or asymetry. Sinuses non tender.  Extra occullar muscles intact.. External ears normal, . Neck: supple, no adenopathy,JVD or thyromegaly.No bruits.  Chest: Clear to ascultation bilaterally.No crackles or wheezes. Non tender to palpation  Breast: No asymetry,no masses or lumps. No tenderness. No nipple discharge or inversion. No axillary or supraclavicular adenopathy  Cardiovascular system; Heart sounds normal,  S1 and  S2 ,no S3.  No murmur, or thrill. Apical beat not displaced Peripheral pulses normal.  Abdomen: Soft, non tender, no organomegaly or masses. No bruits. Bowel sounds normal. No guarding, tenderness or rebound.   GU: External genitalia normal female genitalia , normal female distribution of hair. No lesions. Urethral meatus normal in size, no  Prolapse, no lesions visibly  Present. Bladder non tender. Vagina pink and moist , with no visible lesions , discharge present . Adequate pelvic support no  cystocele or rectocele noted Cervix pink and appears healthy, no lesions or ulcerations noted, no discharge noted from os Uterus normal size, no adnexal masses, no cervical motion or adnexal tenderness.   Musculoskeletal exam: Decreased  ROM of  lumbar spine and hips , adequate  in shoulders and knees. No deformity ,swelling or crepitus noted. No muscle wasting or atrophy.   Neurologic: Cranial nerves 2 to 12 intact. Power, tone ,sensation  and reflexes normal throughout. No disturbance in gait. No tremor.  Skin: Intact, no ulceration, erythema , scaling or rash noted. Pigmentation normal throughout  Psych; Normal mood and affect. Judgement and concentration normal   Assessment & Plan:  Sciatica of left side Uncontrolled.Toradol and depo medrol administered IM in the office , to be followed by a short course of oral prednisone.   Annual physical exam Annual exam as documented. Counseling done  re healthy lifestyle involving commitment to 150 minutes exercise per week, heart healthy diet, and attaining healthy weight.The importance of adequate sleep also discussed. Regular seat belt use and home safety, is also discussed. Changes in health habits are decided on by the patient with goals and time frames  set for achieving them. Immunization and cancer screening needs are specifically addressed at this visit.   Vaginitis and vulvovaginitis Urine specimen sent for STD testing per pt request

## 2021-05-17 NOTE — Telephone Encounter (Signed)
Noted  

## 2021-05-18 LAB — LIPID PANEL
Chol/HDL Ratio: 2.5 ratio (ref 0.0–4.4)
Cholesterol, Total: 193 mg/dL (ref 100–199)
HDL: 78 mg/dL (ref >39)
LDL Chol Calc (NIH): 97 mg/dL (ref 0–99)
Triglycerides: 105 mg/dL (ref 0–149)
VLDL Cholesterol Cal: 18 mg/dL (ref 5–40)

## 2021-05-18 LAB — CBC
Hematocrit: 40 % (ref 34.0–46.6)
Hemoglobin: 13.1 g/dL (ref 11.1–15.9)
MCH: 30.2 pg (ref 26.6–33.0)
MCHC: 32.8 g/dL (ref 31.5–35.7)
MCV: 92 fL (ref 79–97)
Platelets: 313 10*3/uL (ref 150–450)
RBC: 4.34 x10E6/uL (ref 3.77–5.28)
RDW: 12.8 % (ref 11.7–15.4)
WBC: 7.1 10*3/uL (ref 3.4–10.8)

## 2021-05-18 LAB — URINE CYTOLOGY ANCILLARY ONLY
Bacterial Vaginitis-Urine: NEGATIVE
Candida Urine: NEGATIVE
Chlamydia: NEGATIVE
Comment: NEGATIVE
Comment: NEGATIVE
Comment: NORMAL
Neisseria Gonorrhea: NEGATIVE
Trichomonas: NEGATIVE

## 2021-05-18 LAB — BMP8+EGFR
BUN/Creatinine Ratio: 12 (ref 9–23)
BUN: 8 mg/dL (ref 6–24)
CO2: 22 mmol/L (ref 20–29)
Calcium: 9.5 mg/dL (ref 8.7–10.2)
Chloride: 106 mmol/L (ref 96–106)
Creatinine, Ser: 0.65 mg/dL (ref 0.57–1.00)
Glucose: 92 mg/dL (ref 70–99)
Potassium: 4.2 mmol/L (ref 3.5–5.2)
Sodium: 144 mmol/L (ref 134–144)
eGFR: 104 mL/min/{1.73_m2} (ref >59)

## 2021-05-18 LAB — TSH: TSH: 0.677 u[IU]/mL (ref 0.450–4.500)

## 2021-05-20 ENCOUNTER — Encounter: Payer: Self-pay | Admitting: Family Medicine

## 2021-05-20 NOTE — Assessment & Plan Note (Signed)
Urine specimen sent for STD testing per pt request

## 2021-05-20 NOTE — Assessment & Plan Note (Signed)

## 2021-05-23 LAB — CYTOLOGY - PAP
Comment: NEGATIVE
Diagnosis: NEGATIVE
High risk HPV: NEGATIVE

## 2021-11-15 ENCOUNTER — Ambulatory Visit: Payer: 59 | Admitting: Family Medicine

## 2022-01-16 ENCOUNTER — Encounter: Payer: Self-pay | Admitting: Family Medicine

## 2022-01-16 ENCOUNTER — Ambulatory Visit (INDEPENDENT_AMBULATORY_CARE_PROVIDER_SITE_OTHER): Payer: No Typology Code available for payment source | Admitting: Family Medicine

## 2022-01-16 VITALS — BP 114/79 | HR 84 | Temp 99.3°F | Ht 65.0 in | Wt 172.0 lb

## 2022-01-16 DIAGNOSIS — G8929 Other chronic pain: Secondary | ICD-10-CM

## 2022-01-16 DIAGNOSIS — M170 Bilateral primary osteoarthritis of knee: Secondary | ICD-10-CM

## 2022-01-16 DIAGNOSIS — M5440 Lumbago with sciatica, unspecified side: Secondary | ICD-10-CM | POA: Diagnosis not present

## 2022-01-16 DIAGNOSIS — Z800 Family history of malignant neoplasm of digestive organs: Secondary | ICD-10-CM | POA: Diagnosis not present

## 2022-01-16 DIAGNOSIS — F419 Anxiety disorder, unspecified: Secondary | ICD-10-CM

## 2022-01-16 DIAGNOSIS — Z1231 Encounter for screening mammogram for malignant neoplasm of breast: Secondary | ICD-10-CM

## 2022-01-16 MED ORDER — BENZONATATE 100 MG PO CAPS: 100.0000 mg | ORAL_CAPSULE | Freq: Two times a day (BID) | ORAL | 0 refills | Status: DC | PRN

## 2022-01-16 MED ORDER — CYCLOBENZAPRINE HCL 10 MG PO TABS: ORAL_TABLET | ORAL | 0 refills | Status: DC

## 2022-01-16 MED ORDER — KETOROLAC TROMETHAMINE 60 MG/2ML IM SOLN
60.0000 mg | Freq: Once | INTRAMUSCULAR | Status: AC
  Administered 2022-01-16: 60 mg via INTRAMUSCULAR

## 2022-01-16 MED ORDER — METHYLPREDNISOLONE ACETATE 80 MG/ML IJ SUSP
80.0000 mg | Freq: Once | INTRAMUSCULAR | Status: AC
  Administered 2022-01-16: 80 mg via INTRAMUSCULAR

## 2022-01-16 MED ORDER — ALPRAZOLAM 0.25 MG PO TABS: 0.2500 mg | ORAL_TABLET | Freq: Every evening | ORAL | 0 refills | Status: DC | PRN

## 2022-01-16 NOTE — Patient Instructions (Addendum)
ANNUAL EXAM IN  2 to 3  MONTHS, CALL IF YOU NED ME SOONER  TORADOL 60 MG AND DEPO MEDROL 80 MG Im FOR BACK PAIN AND SHORT COURSE OF MUSCLE RELAXANTS   [facility] HAS EXCELLENT GRIEF SESSIONS,  NURSE PLS PROVIDE INFO RE TIME AND DAY  PLEASE SCHEDULE AND GET YOUR MAMMOGRAM  I WILL REFER YOU TO GI WITH 2 FAMILY MEMBERS WITH COLON CANCER, MOTHER AND AUNT  LIMITED XANAX IS PRESCRIBED  Condolence and prayers re the recent loss of your Mother  Tessalon perles are prescribed for chest congestion  Thanks for choosing [facility], we consider it a privelige to serve you.

## 2022-01-16 NOTE — Progress Notes (Signed)
   MARETTA OVERDORF     MRN: [id number]      DOB: [birth year]   HPI Ms. Tallarico is here for follow up and re-evaluation of chronic medical conditions, medication management and review of any available recent lab and radiology data.  Preventive health is updated, specifically  Cancer screening and Immunization.   2 week h/o chest congestion , clear mucus, no fever or chills Chronic bilateral knee knee and back pain Acute grief following recent loss of her mother after a less than 3 month dx of colon cancer, also has an aunt with colon cancer ROS Denies recent fever or chills. Denies sinus pressure, nasal congestion, ear pain or sore throat. Denies chest congestion, productive cough or wheezing. Denies chest pains, palpitations and leg swelling Denies abdominal pain, nausea, vomiting,diarrhea or constipation.   Denies dysuria, frequency, hesitancy or incontinence. Denies headaches, seizures, numbness, or tingling. Denies skin break down or rash.   PE  BP 114/79 (BP Location: Left Arm, Patient Position: Sitting, Cuff Size: Normal)   Pulse 84   Temp 99.3 F (37.4 C)   Ht '5\' 5"'$  (1.651 m)   Wt 172 lb (78 kg)   LMP 07/01/2013   SpO2 97%   BMI 28.62 kg/m   Patient alert and oriented and in no cardiopulmonary distress.  HEENT: No facial asymmetry, EOMI,     Neck supple .  Chest: Clear to auscultation bilaterally.  CVS: S1, S2 no murmurs, no S3.Regular rate.  ABD: Soft non tender.   Ext: No edema  MS: decreased  ROM lumbar spine, decreased ROM knees   Skin: Intact, no ulcerations or rash noted.  Psych: Good eye contact, teaful at times. Memory intact mildly  anxious not depressed appearing.  CNS: CN 2-12 intact, power,  normal throughout.no focal deficits noted.   Assessment & Plan Back pain Uncontrolled pain with current flare, also  Back spasm, toradol 60, depo medriol 80 mg iM and short coursse of muscle relaxants  Anxiety Increased and uncontrolled anxiety  associated with recent loss of her mother, limited xanax for as needed use  Osteoarthritis of both knees Increased and uncontrolled pain, toradol and depo medrol IM in office  Family history of colon cancer Mother and maternal Aunt both dx with colon cancer requests GI eval for screening , nex due in 2 years

## 2022-01-21 ENCOUNTER — Encounter: Payer: Self-pay | Admitting: Family Medicine

## 2022-01-21 DIAGNOSIS — Z800 Family history of malignant neoplasm of digestive organs: Secondary | ICD-10-CM | POA: Insufficient documentation

## 2022-01-21 DIAGNOSIS — M170 Bilateral primary osteoarthritis of knee: Secondary | ICD-10-CM | POA: Insufficient documentation

## 2022-01-21 NOTE — Assessment & Plan Note (Signed)
Uncontrolled pain with current flare, also  Back spasm, toradol 60, depo medriol 80 mg iM and short coursse of muscle relaxants

## 2022-01-21 NOTE — Assessment & Plan Note (Signed)
Increased and uncontrolled pain, toradol and depo medrol IM in office

## 2022-01-21 NOTE — Assessment & Plan Note (Signed)
Increased and uncontrolled anxiety associated with recent loss of her mother, limited xanax for as needed use

## 2022-01-21 NOTE — Assessment & Plan Note (Signed)
Mother and maternal Aunt both dx with colon cancer requests GI eval for screening , nex due in 2 years

## 2022-01-23 ENCOUNTER — Encounter: Payer: Self-pay | Admitting: *Deleted

## 2022-01-24 ENCOUNTER — Ambulatory Visit (HOSPITAL_COMMUNITY)
Admission: RE | Admit: 2022-01-24 | Discharge: 2022-01-24 | Disposition: A | Discharge status: Home / Self Care | Payer: No Typology Code available for payment source | Source: Ambulatory Visit | Attending: Family Medicine | Admitting: Family Medicine

## 2022-01-24 DIAGNOSIS — Z1231 Encounter for screening mammogram for malignant neoplasm of breast: Secondary | ICD-10-CM | POA: Insufficient documentation

## 2022-02-06 ENCOUNTER — Encounter (HOSPITAL_COMMUNITY): Payer: Self-pay

## 2022-02-07 ENCOUNTER — Telehealth: Payer: Self-pay

## 2022-02-07 NOTE — Telephone Encounter (Signed)
Patient returning a call. Does not know who called.

## 2022-02-07 NOTE — Telephone Encounter (Signed)
No voicemail tried to call to let her know radiology is trying to reach her not our office.

## 2022-02-13 ENCOUNTER — Encounter (HOSPITAL_COMMUNITY): Payer: Self-pay

## 2022-02-20 ENCOUNTER — Encounter (HOSPITAL_COMMUNITY): Payer: Self-pay

## 2022-02-26 ENCOUNTER — Other Ambulatory Visit (HOSPITAL_COMMUNITY): Payer: Self-pay | Admitting: Family Medicine

## 2022-02-26 DIAGNOSIS — R928 Other abnormal and inconclusive findings on diagnostic imaging of breast: Secondary | ICD-10-CM

## 2022-02-28 ENCOUNTER — Ambulatory Visit (HOSPITAL_COMMUNITY)
Admission: RE | Admit: 2022-02-28 | Discharge: 2022-02-28 | Disposition: A | Discharge status: Home / Self Care | Payer: No Typology Code available for payment source | Source: Ambulatory Visit | Attending: Family Medicine | Admitting: Family Medicine

## 2022-02-28 DIAGNOSIS — R928 Other abnormal and inconclusive findings on diagnostic imaging of breast: Secondary | ICD-10-CM

## 2022-04-02 ENCOUNTER — Encounter: Payer: No Typology Code available for payment source | Admitting: Family Medicine

## 2022-04-02 ENCOUNTER — Encounter: Payer: Self-pay | Admitting: Family Medicine

## 2022-04-10 ENCOUNTER — Encounter: Payer: Self-pay | Admitting: Family Medicine

## 2022-04-11 ENCOUNTER — Telehealth: Payer: Self-pay | Admitting: Family Medicine

## 2022-04-11 NOTE — Telephone Encounter (Signed)
Pt called in regards to the my chart message sent yesterday. Can you please contact patient?

## 2022-04-12 ENCOUNTER — Ambulatory Visit (INDEPENDENT_AMBULATORY_CARE_PROVIDER_SITE_OTHER): Payer: No Typology Code available for payment source | Admitting: Internal Medicine

## 2022-04-12 ENCOUNTER — Encounter: Payer: Self-pay | Admitting: Internal Medicine

## 2022-04-12 ENCOUNTER — Encounter: Payer: Self-pay | Admitting: Family Medicine

## 2022-04-12 ENCOUNTER — Encounter: Payer: No Typology Code available for payment source | Admitting: Family Medicine

## 2022-04-12 DIAGNOSIS — M5440 Lumbago with sciatica, unspecified side: Secondary | ICD-10-CM | POA: Diagnosis not present

## 2022-04-12 DIAGNOSIS — G8929 Other chronic pain: Secondary | ICD-10-CM

## 2022-04-12 DIAGNOSIS — R0989 Other specified symptoms and signs involving the circulatory and respiratory systems: Secondary | ICD-10-CM

## 2022-04-12 MED ORDER — METHYLPREDNISOLONE 4 MG PO TBPK: ORAL_TABLET | ORAL | 0 refills | Status: DC

## 2022-04-12 NOTE — Progress Notes (Signed)
   Acute Telephone Visit  Virtual Visit via Telephone Note  I connected with Anna Cantu on 04/12/22 at  4:40 PM EST by telephone and verified that I am speaking with the correct person using two identifiers.  Location: Patient: [address] [place], [place] [address] Provider: [phone] S. [address], [place], [place] [address]   I discussed the limitations, risks, security and privacy concerns of performing an evaluation and management service by telephone and the availability of in person appointments. I also discussed with the patient that there may be a patient responsible charge related to this service. The patient expressed understanding and agreed to proceed.  History of Present Illness:  [name] is evaluated today via telephone encounter for upper respiratory symptoms.  She reports a 5-day history of chills and a persistent cough.  She completed a home COVID-19 test that was negative, however her son has recently had COVID-74.  [name] feels that overall her symptoms are improving, however she continues to struggle with coughing at night.  She is interested in medication recommendations for cough relief.  Because of coughing spells, she states that she has irritated a pinched nerve in her back.  She has previously received injections for this issue and is wondering about treatment options currently.  [name] has otherwise been managing her symptoms with zinc, vitamin C, vitamin D, a multivitamin, tea with ginger, Mucinex, and Nettie pot, and Tessalon Perles.  Assessment and Plan:  Upper Respiratory Symptoms Evaluated today for a 5-day history of the symptoms as noted above.  I am still concerned for possible COVID-19 despite a negative COVID test because her son has recently had COVID-74 and they live together.  She has been managing her symptoms with numerous over-the-counter medications as noted above.  I have additionally recommended that she use NyQuil in the  evening to help with sleep.  I do not currently see an indication for antibiotic therapy, however I instructed her to return to care next week if her cough becomes productive of yellow/green sputum or if she develops fever.  Acute back pain She has a history of chronic low back pain with sciatica that she has irritated secondary to coughing spells.  I have prescribed a Medrol Dosepak today for pain relief.  Follow Up Instructions:  I discussed the assessment and treatment plan with the patient. The patient was provided an opportunity to ask questions and all were answered. The patient agreed with the plan and demonstrated an understanding of the instructions.   The patient was advised to call back or seek an in-person evaluation if the symptoms worsen or if the condition fails to improve as anticipated.  I provided 7 minutes of non-face-to-face time during this encounter.   Anna Abraham, MD

## 2022-04-12 NOTE — Progress Notes (Unsigned)
Virtual Visit via Telephone Note   This visit type was conducted via telephone. This format is felt to be most appropriate for this patient at this time.  The patient did not have access to video technology/had technical difficulties with video requiring transitioning to audio format only (telephone).  All issues noted in this document were discussed and addressed.  No physical exam could be performed with this format.  Evaluation Performed:  Follow-up visit  Date:  04/12/2022   ID:  Anna Cantu, DOB 1965/12/09, MRN [id number]  Patient Location: Home Provider Location: Office/Clinic  Participants: Patient*** Location of Patient: Home Location of Provider: Telehealth Consent was obtain for visit to be over via telehealth. I verified that I am speaking with the correct person using two identifiers.  PCP:  Fayrene Helper, MD   Chief Complaint:  ***  History of Present Illness:    Anna Cantu is a 56 y.o. female with ***  The patient {does/does not:200015} have symptoms concerning for COVID-19 infection (fever, chills, cough, or new shortness of breath).   Past Medical, Surgical, Social History, Allergies, and Medications have been Reviewed.  Past Medical History:  Diagnosis Date   Chronic back pain    Gastritis    Hemorrhoids    Nicotine addiction    Obesity    Ovarian cyst    Past Surgical History:  Procedure Laterality Date   btl and endometrial abaltion     CHOLECYSTECTOMY  2007   Dr. Aviva Signs    COLONOSCOPY   08/19/2008   Dr. Madolyn Frieze canal hemorrhoids.  Diminutive rectal polyp  status post cold biopsy and removal.  The remainder of rectal mucosa and colon was normal. Path: polypoid.    COLONOSCOPY WITH ESOPHAGOGASTRODUODENOSCOPY (EGD) N/A 07/08/2013   RMR: Rectal and colonic polyps-one tubular adenoma. colonic diverticulosis. Hemorroids-likely source of hematochezia.  NEXT TCS IN 7 YEARS. Small hiatal hernia . Antral  erosions;nonbleeding duodenal AVM; status post gastric biopsy with severe H.pylori   ESOPHAGOGASTRODUODENOSCOPY   08/19/2008   Dr. Volney American esophageal erosions, consistent with erosive reflux esophagitis/small HH/The remainder of her upper GI tract appeared normal   HEMORRHOID SURGERY N/A 06/11/2019   Procedure: HEMORRHOIDECTOMY EXTENSIVE;  Surgeon: Virl Cagey, MD;  Location: [facility];  Service: General;  Laterality: N/A;   left knee surgey- arthroscopy June 2011     Dr. Percell Miller    right carpal tunnel release  1991   Right Inguinal hernia repair  1982   right knee surgery  1993   TUBAL LIGATION       Current Meds  Medication Sig   acetaminophen (TYLENOL) 500 MG tablet Take 1 tablet (500 mg total) by mouth every 6 (six) hours as needed.   ALPRAZolam (XANAX) 0.25 MG tablet Take 1 tablet (0.25 mg total) by mouth at bedtime as needed for anxiety.   benzonatate (TESSALON) 100 MG capsule Take 1 capsule (100 mg total) by mouth 2 (two) times daily as needed for cough.   cyclobenzaprine (FLEXERIL) 10 MG tablet Take one tablet by mouth twice daily, as needed, for back spasm   diclofenac Sodium (VOLTAREN) 1 % GEL Apply 2 g topically 4 (four) times daily.     Allergies:   Ciprofloxacin, Flagyl [metronidazole], and Amoxicillin   ROS:   Please see the history of present illness.    *** All other systems reviewed and are negative.   Labs/Other Tests and Data Reviewed:    Recent Labs: 05/17/2021: BUN 8; Creatinine, Ser  0.65; Hemoglobin 13.1; Platelets 313; Potassium 4.2; Sodium 144; TSH 0.677   Recent Lipid Panel Lab Results  Component Value Date/Time   CHOL 193 05/17/2021 11:38 AM   TRIG 105 05/17/2021 11:38 AM   HDL 78 05/17/2021 11:38 AM   CHOLHDL 2.5 05/17/2021 11:38 AM   CHOLHDL 3.5 07/26/2019 10:03 AM   LDLCALC 97 05/17/2021 11:38 AM   LDLCALC 137 (H) 07/26/2019 10:03 AM    Wt Readings from Last 3 Encounters:  01/16/22 172 lb (78 kg)  05/17/21 158 lb 0.6 oz (71.7 kg)   05/23/20 150 lb (68 kg)     Objective:    Vital Signs:  LMP 07/01/2013    {[facility] Virtual Exam (Optional):[phone]::"VITAL SIGNS:  reviewed"}  ASSESSMENT & PLAN:     Time:   Today, I have spent *** minutes reviewing the chart, including problem list, medications, and with the patient with telehealth technology discussing the above problems.   Medication Adjustments/Labs and Tests Ordered: Current medicines are reviewed at length with the patient today.  Concerns regarding medicines are outlined above.   Tests Ordered: No orders of the defined types were placed in this encounter.   Medication Changes: No orders of the defined types were placed in this encounter.    Note: This dictation was prepared with Dragon dictation along with smaller phrase technology. Similar sounding words can be transcribed inadequately or may not be corrected upon review. Any transcriptional errors that result from this process are unintentional.      Disposition:  Follow up  Signed, Alvira Monday, FNP  04/12/2022 2:04 PM     [facility]

## 2022-04-13 NOTE — Progress Notes (Signed)
Erroneous encounter

## 2022-04-15 NOTE — Telephone Encounter (Signed)
Patient had televisit 11/17 with Dr Court Joy.

## 2022-04-30 ENCOUNTER — Ambulatory Visit (INDEPENDENT_AMBULATORY_CARE_PROVIDER_SITE_OTHER): Payer: No Typology Code available for payment source | Admitting: Family Medicine

## 2022-04-30 ENCOUNTER — Other Ambulatory Visit (HOSPITAL_COMMUNITY)
Admission: RE | Admit: 2022-04-30 | Discharge: 2022-04-30 | Disposition: A | Discharge status: Home / Self Care | Payer: No Typology Code available for payment source | Source: Ambulatory Visit | Attending: Family Medicine | Admitting: Family Medicine

## 2022-04-30 ENCOUNTER — Encounter: Payer: Self-pay | Admitting: Family Medicine

## 2022-04-30 ENCOUNTER — Encounter: Payer: Self-pay | Admitting: Internal Medicine

## 2022-04-30 VITALS — BP 118/75 | HR 56 | Ht 65.0 in | Wt 176.1 lb

## 2022-04-30 DIAGNOSIS — E559 Vitamin D deficiency, unspecified: Secondary | ICD-10-CM

## 2022-04-30 DIAGNOSIS — M5440 Lumbago with sciatica, unspecified side: Secondary | ICD-10-CM | POA: Diagnosis not present

## 2022-04-30 DIAGNOSIS — N760 Acute vaginitis: Secondary | ICD-10-CM | POA: Diagnosis present

## 2022-04-30 DIAGNOSIS — Z23 Encounter for immunization: Secondary | ICD-10-CM

## 2022-04-30 DIAGNOSIS — H6592 Unspecified nonsuppurative otitis media, left ear: Secondary | ICD-10-CM | POA: Diagnosis not present

## 2022-04-30 DIAGNOSIS — Z6831 Body mass index (BMI) 31.0-31.9, adult: Secondary | ICD-10-CM

## 2022-04-30 DIAGNOSIS — Z13220 Encounter for screening for lipoid disorders: Secondary | ICD-10-CM

## 2022-04-30 DIAGNOSIS — Z124 Encounter for screening for malignant neoplasm of cervix: Secondary | ICD-10-CM

## 2022-04-30 DIAGNOSIS — Z0001 Encounter for general adult medical examination with abnormal findings: Secondary | ICD-10-CM | POA: Diagnosis not present

## 2022-04-30 DIAGNOSIS — G8929 Other chronic pain: Secondary | ICD-10-CM

## 2022-04-30 DIAGNOSIS — Z800 Family history of malignant neoplasm of digestive organs: Secondary | ICD-10-CM

## 2022-04-30 DIAGNOSIS — J3089 Other allergic rhinitis: Secondary | ICD-10-CM

## 2022-04-30 DIAGNOSIS — R195 Other fecal abnormalities: Secondary | ICD-10-CM

## 2022-04-30 DIAGNOSIS — K219 Gastro-esophageal reflux disease without esophagitis: Secondary | ICD-10-CM | POA: Diagnosis not present

## 2022-04-30 DIAGNOSIS — E6609 Other obesity due to excess calories: Secondary | ICD-10-CM

## 2022-04-30 DIAGNOSIS — R1319 Other dysphagia: Secondary | ICD-10-CM

## 2022-04-30 MED ORDER — KETOROLAC TROMETHAMINE 60 MG/2ML IM SOLN
60.0000 mg | Freq: Once | INTRAMUSCULAR | Status: AC
  Administered 2022-04-30: 60 mg via INTRAMUSCULAR

## 2022-04-30 MED ORDER — PANTOPRAZOLE SODIUM 40 MG PO TBEC: 40.0000 mg | DELAYED_RELEASE_TABLET | Freq: Every day | ORAL | 3 refills | Status: DC

## 2022-04-30 MED ORDER — AZITHROMYCIN 250 MG PO TABS: ORAL_TABLET | ORAL | 0 refills | Status: AC

## 2022-04-30 MED ORDER — MONTELUKAST SODIUM 10 MG PO TABS: 10.0000 mg | ORAL_TABLET | Freq: Every day | ORAL | 3 refills | Status: DC

## 2022-04-30 MED ORDER — FLUCONAZOLE 150 MG PO TABS: 150.0000 mg | ORAL_TABLET | Freq: Once | ORAL | 0 refills | Status: AC

## 2022-04-30 NOTE — Assessment & Plan Note (Signed)
Std and infection testing

## 2022-04-30 NOTE — Patient Instructions (Addendum)
Follow-up in 5 months, call if you need me sooner.  Shingrix No. 2 at that visit.  Specimens sent for infection and also screening for cervical cancer  Labs today, CBC, lipid, cmp and EGFr, tSH and vit D  Shingrix No. 1 in office today.  Toradol 60 mg IM in office for back pain  I recommended the COVID-vaccine as well as the flu vaccine, both are at your pharmacy.  You are referred to gastroenterology regarding symptoms that were discussed.  Z-Pack and fluconazole is prescribed for left ear infection as well as cervical adenitis.  1 fluconazole tablet is also prescribed.  I recommend resuming reflux medication, protonix, and this is sent today.  Start singulair once daily for allergies, this is prescribed  Thanks for choosing [facility], we consider it a privelige to serve you.

## 2022-04-30 NOTE — Assessment & Plan Note (Signed)
C/o dysphagia, intermittent nausea, abdominal pain ,start protonix and GI eval

## 2022-04-30 NOTE — Assessment & Plan Note (Signed)
Increased and uncontrolled , toradol 60 mg iM

## 2022-04-30 NOTE — Assessment & Plan Note (Signed)
Z pack and fluconazne

## 2022-04-30 NOTE — Assessment & Plan Note (Signed)
Uncontrolled , commit to daily singulair

## 2022-04-30 NOTE — Progress Notes (Signed)
    Anna Cantu     MRN: [id number]      DOB: 1966/02/08  HPI: Patient is in for annual physical exam. C/o uncontrolled back pain wants a shot C/o difficulty swallowing, change in bM, concerned as 2 family members had colon cancer. C/o left ear pressure, right cervical pain and unconttrolled allergy symptoms  Immunization is reviewed , and  updated if needed.   PE: BP 118/75 (BP Location: Right Arm, Patient Position: Sitting, Cuff Size: Large)   Pulse (!) 56   Ht '5\' 5"'$  (1.651 m)   Wt 176 lb 1.3 oz (79.9 kg)   LMP 07/01/2013   SpO2 98%   BMI 29.30 kg/m   Pleasant  female, alert and oriented x 3, in no cardio-pulmonary distress. Afebrile. HEENT No facial trauma or asymetry. Sinuses non tender. Roght tM clear, left du;ll with fluid and poor light reflex, right anterior cervical adenitis right maxillary sinus tender Extra occullar muscles intact.. External ears normal, . Neck: supple, no JVD or thyromegaly.No bruits.  Chest: Clear to ascultation bilaterally.No crackles or wheezes. Non tender to palpation  Breast: No asymetry,no masses or lumps. No tenderness. No nipple discharge or inversion. No axillary or supraclavicular adenopathy  Cardiovascular system; Heart sounds normal,  S1 and  S2 ,no S3.  No murmur, or thrill. Apical beat not displaced Peripheral pulses normal.  Abdomen: Soft, non tender, no organomegaly or masses. No bruits. Bowel sounds normal. No guarding, tenderness or rebound.   GU: External genitalia normal female genitalia , normal female distribution of hair. No lesions. Urethral meatus normal in size, no  Prolapse, no lesions visibly  Present. Bladder non tender. Vagina pink and moist , with no visible lesions , discharge present . Adequate pelvic support no  cystocele or rectocele noted Cervix pink and appears healthy, no lesions or ulcerations noted, no discharge noted from os Uterus normal size, no adnexal masses, no cervical motion  or adnexal tenderness.   Musculoskeletal exam: Decreased  ROM of lumbar spine,adequate in  hips , shoulders and knees. No deformity ,swelling or crepitus noted. No muscle wasting or atrophy.   Neurologic: Cranial nerves 2 to 12 intact. Power, tone ,sensation and reflexes normal throughout. No disturbance in gait. No tremor.  Skin: Intact, no ulceration, erythema , scaling or rash noted. Pigmentation normal throughout  Psych; Normal mood and affect. Judgement and concentration normal   Assessment & Plan:  Annual visit for general adult medical examination with abnormal findings Annual exam as documented. Counseling done  re healthy lifestyle involving commitment to 150 minutes exercise per week, heart healthy diet, and attaining healthy weight.The importance of adequate sleep also discussed. Regular seat belt use and home safety, is also discussed. Changes in health habits are decided on by the patient with goals and time frames  set for achieving them. Immunization and cancer screening needs are specifically addressed at this visit.   Left otitis media with effusion Z pack and fluconazne  Back pain Increased and uncontrolled , toradol 60 mg iM  GERD (gastroesophageal reflux disease) C/o dysphagia, intermittent nausea, abdominal pain ,start protonix and GI eval  Vaginitis and vulvovaginitis Std and infection testing  Allergic rhinitis Uncontrolled , commit to daily singulair

## 2022-04-30 NOTE — Assessment & Plan Note (Signed)

## 2022-05-01 ENCOUNTER — Encounter: Payer: Self-pay | Admitting: Family Medicine

## 2022-05-01 LAB — BMP8+EGFR
BUN/Creatinine Ratio: 14 (ref 9–23)
BUN: 11 mg/dL (ref 6–24)
CO2: 23 mmol/L (ref 20–29)
Calcium: 9.7 mg/dL (ref 8.7–10.2)
Chloride: 104 mmol/L (ref 96–106)
Creatinine, Ser: 0.78 mg/dL (ref 0.57–1.00)
Glucose: 99 mg/dL (ref 70–99)
Potassium: 4 mmol/L (ref 3.5–5.2)
Sodium: 141 mmol/L (ref 134–144)
eGFR: 89 mL/min/{1.73_m2} (ref >59)

## 2022-05-01 LAB — CERVICOVAGINAL ANCILLARY ONLY
Bacterial Vaginitis (gardnerella): POSITIVE — AB
Candida Glabrata: NEGATIVE
Candida Vaginitis: NEGATIVE
Chlamydia: NEGATIVE
Comment: NEGATIVE
Comment: NEGATIVE
Comment: NEGATIVE
Comment: NEGATIVE
Comment: NEGATIVE
Comment: NORMAL
Neisseria Gonorrhea: NEGATIVE
Trichomonas: NEGATIVE

## 2022-05-01 LAB — LIPID PANEL
Chol/HDL Ratio: 2.5 ratio (ref 0.0–4.4)
Cholesterol, Total: 211 mg/dL — ABNORMAL HIGH (ref 100–199)
HDL: 83 mg/dL (ref >39)
LDL Chol Calc (NIH): 111 mg/dL — ABNORMAL HIGH (ref 0–99)
Triglycerides: 99 mg/dL (ref 0–149)
VLDL Cholesterol Cal: 17 mg/dL (ref 5–40)

## 2022-05-01 LAB — VITAMIN D 25 HYDROXY (VIT D DEFICIENCY, FRACTURES): Vit D, 25-Hydroxy: 34 ng/mL (ref 30.0–100.0)

## 2022-05-01 MED ORDER — ONDANSETRON HCL 4 MG PO TABS: 4.0000 mg | ORAL_TABLET | Freq: Three times a day (TID) | ORAL | 0 refills | Status: DC | PRN

## 2022-05-01 MED ORDER — CLINDAMYCIN PHOSPHATE 2 % VA GEL: VAGINAL | 0 refills | Status: DC

## 2022-05-01 NOTE — Addendum Note (Signed)
Addended by: Fayrene Helper on: 05/01/2022 09:31 PM   Modules accepted: Orders

## 2022-05-02 ENCOUNTER — Telehealth: Payer: Self-pay | Admitting: Family Medicine

## 2022-05-02 ENCOUNTER — Other Ambulatory Visit: Payer: Self-pay

## 2022-05-02 MED ORDER — ONDANSETRON HCL 4 MG PO TABS: 4.0000 mg | ORAL_TABLET | Freq: Three times a day (TID) | ORAL | 0 refills | Status: DC | PRN

## 2022-05-02 MED ORDER — CLINDAMYCIN PHOSPHATE 2 % VA GEL: VAGINAL | 0 refills | Status: DC

## 2022-05-02 MED ORDER — DICLOFENAC SODIUM 1 % EX GEL: 2.0000 g | Freq: Four times a day (QID) | CUTANEOUS | 1 refills | Status: DC

## 2022-05-02 MED ORDER — PANTOPRAZOLE SODIUM 40 MG PO TBEC: 40.0000 mg | DELAYED_RELEASE_TABLET | Freq: Every day | ORAL | 3 refills | Status: DC

## 2022-05-02 MED ORDER — MONTELUKAST SODIUM 10 MG PO TABS: 10.0000 mg | ORAL_TABLET | Freq: Every day | ORAL | 3 refills | Status: DC

## 2022-05-02 NOTE — Telephone Encounter (Signed)
Pt called stating she is changing pharmacies. Can you please resend all meds to the new phar?     NEW:   [facility] [address] [employer]

## 2022-05-02 NOTE — Telephone Encounter (Signed)
Refills sent to new pharmacy per patient request

## 2022-05-03 LAB — CYTOLOGY - PAP
Comment: NEGATIVE
Diagnosis: NEGATIVE
Diagnosis: REACTIVE
High risk HPV: NEGATIVE

## 2022-06-26 ENCOUNTER — Encounter: Payer: Self-pay | Admitting: Internal Medicine

## 2022-06-26 ENCOUNTER — Ambulatory Visit: Payer: No Typology Code available for payment source | Admitting: Internal Medicine

## 2022-06-26 ENCOUNTER — Telehealth: Payer: Self-pay | Admitting: *Deleted

## 2022-06-26 ENCOUNTER — Encounter: Payer: Self-pay | Admitting: *Deleted

## 2022-06-26 VITALS — BP 108/71 | HR 66 | Temp 98.5°F | Ht 65.0 in | Wt 179.7 lb

## 2022-06-26 DIAGNOSIS — K219 Gastro-esophageal reflux disease without esophagitis: Secondary | ICD-10-CM

## 2022-06-26 DIAGNOSIS — R140 Abdominal distension (gaseous): Secondary | ICD-10-CM

## 2022-06-26 DIAGNOSIS — Z800 Family history of malignant neoplasm of digestive organs: Secondary | ICD-10-CM

## 2022-06-26 DIAGNOSIS — D126 Benign neoplasm of colon, unspecified: Secondary | ICD-10-CM | POA: Diagnosis not present

## 2022-06-26 DIAGNOSIS — K31A0 Gastric intestinal metaplasia, unspecified: Secondary | ICD-10-CM

## 2022-06-26 NOTE — Progress Notes (Signed)
  Primary Care Physician:  Simpson, Margaret E, MD Primary Gastroenterologist:  Dr. Messina Kosinski  Chief Complaint  Patient presents with   Gastroesophageal Reflux    Referred for gerd, dysphagia, and change in bowel habits. mother recently passed with colon cancer. Stools are more skinny than normal for the past few months.has some abdominal pain every now and then. Has one BM a day.     HPI:   Anna Cantu is a 57 y.o. female who presents to clinic today by referral from her PCP Dr. Simpson for evaluation.  Family history of colon cancer in mother who passed away as well as her aunt.  Last colonoscopy 2015 with multiple polyps removed, 1 tubular adenoma, the rest hyperplastic.  No melena hematochezia.  Does note some incomplete emptying at times.  Takes smooth move tea at times when she feels constipated which helps.  No unintentional weight loss.  Also notes some epigastric bloating and acid reflux.  Historically on pantoprazole though states she only takes this as needed.  Has tried to improve this with diet.  Symptoms mild to moderate, intermittent in nature.  Very occasional dysphagia.  Last 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  Completed treatment with antibiotics with subsequent negative stool antigen testing.    Past Medical History:  Diagnosis Date   Chronic back pain    Gastritis    Hemorrhoids    Nicotine addiction    Obesity    Ovarian cyst     Past Surgical History:  Procedure Laterality Date   btl and endometrial abaltion     CHOLECYSTECTOMY  2007   Dr. Mark Jenkins    COLONOSCOPY   08/19/2008   Dr. Rourk:Anal canal hemorrhoids.  Diminutive rectal polyp  status post cold biopsy and removal.  The remainder of rectal mucosa and colon was normal. Path: polypoid.    COLONOSCOPY WITH ESOPHAGOGASTRODUODENOSCOPY (EGD) N/A 07/08/2013   RMR: Rectal and colonic polyps-one tubular adenoma. colonic diverticulosis. Hemorroids-likely  source of hematochezia.  NEXT TCS IN 7 YEARS. Small hiatal hernia . Antral erosions;nonbleeding duodenal AVM; status post gastric biopsy with severe H.pylori   ESOPHAGOGASTRODUODENOSCOPY   08/19/2008   Dr. Rourk:Distal esophageal erosions, consistent with erosive reflux esophagitis/small HH/The remainder of her upper GI tract appeared normal   HEMORRHOID SURGERY N/A 06/11/2019   Procedure: HEMORRHOIDECTOMY EXTENSIVE;  Surgeon: Bridges, Lindsay C, MD;  Location: [facility];  Service: General;  Laterality: N/A;   left knee surgey- arthroscopy June 2011     Dr. Murphy    right carpal tunnel release  1991   Right Inguinal hernia repair  1982   right knee surgery  1993   TUBAL LIGATION      Current Outpatient Medications  Medication Sig Dispense Refill   acetaminophen (TYLENOL) 500 MG tablet Take 1 tablet (500 mg total) by mouth every 6 (six) hours as needed. 30 tablet 0   diclofenac Sodium (VOLTAREN) 1 % GEL Apply 2 g topically 4 (four) times daily. 350 g 1   ondansetron (ZOFRAN) 4 MG tablet Take 1 tablet (4 mg total) by mouth every 8 (eight) hours as needed for nausea or vomiting. 20 tablet 0   OVER THE COUNTER MEDICATION Vit C Vit D  Tumeric  Zinc biotin     pantoprazole (PROTONIX) 40 MG tablet Take 1 tablet (40 mg total) by mouth daily. (Patient not taking: Reported on 06/26/2022) 30 tablet 3   No current facility-administered medications for this visit.      Allergies as of 06/26/2022 - Review Complete 06/26/2022  Allergen Reaction Noted   Ciprofloxacin  07/16/2017   Flagyl [metronidazole]  07/16/2017   Amoxicillin Itching and Rash 09/16/2011    Family History  Problem Relation Age of Onset   Hypertension Mother    Stroke Mother    Hypertension Father    Breast cancer Paternal Grandmother    Colon cancer Neg Hx     Social History   Socioeconomic History   Marital status: Divorced    Spouse name: Not on file   Number of children: Not on file   Years of education: Not on file    Highest education level: Not on file  Occupational History   Not on file  Tobacco Use   Smoking status: Former    Packs/day: 0.50    Types: Cigarettes    Passive exposure: Past   Smokeless tobacco: Never   Tobacco comments:    quit Mar 03, 2013   Substance and Sexual Activity   Alcohol use: Yes    Comment: occasionally   Drug use: No   Sexual activity: Yes    Birth control/protection: Surgical  Other Topics Concern   Not on file  Social History Narrative   Not on file   Social Determinants of Health   Financial Resource Strain: Not on file  Food Insecurity: Not on file  Transportation Needs: Not on file  Physical Activity: Not on file  Stress: Not on file  Social Connections: Not on file  Intimate Partner Violence: Not on file    Subjective: Review of Systems  Constitutional:  Negative for chills and fever.  HENT:  Negative for congestion and hearing loss.   Eyes:  Negative for blurred vision and double vision.  Respiratory:  Negative for cough and shortness of breath.   Cardiovascular:  Negative for chest pain and palpitations.  Gastrointestinal:  Positive for constipation and heartburn. Negative for abdominal pain, blood in stool, diarrhea, melena and vomiting.       Bloating  Genitourinary:  Negative for dysuria and urgency.  Musculoskeletal:  Negative for joint pain and myalgias.  Skin:  Negative for itching and rash.  Neurological:  Negative for dizziness and headaches.  Psychiatric/Behavioral:  Negative for depression. The patient is not nervous/anxious.        Objective: BP 108/71 (BP Location: Left Arm, Patient Position: Sitting, Cuff Size: Large)   Pulse 66   Temp 98.5 F (36.9 C) (Oral)   Ht 5' 5" (1.651 m)   Wt 179 lb 11.2 oz (81.5 kg)   LMP 07/01/2013   BMI 29.90 kg/m  Physical Exam Constitutional:      Appearance: Normal appearance.  HENT:     Head: Normocephalic and atraumatic.  Eyes:     Extraocular Movements: Extraocular movements  intact.     Conjunctiva/sclera: Conjunctivae normal.  Cardiovascular:     Rate and Rhythm: Normal rate and regular rhythm.  Pulmonary:     Effort: Pulmonary effort is normal.     Breath sounds: Normal breath sounds.  Abdominal:     General: Bowel sounds are normal.     Palpations: Abdomen is soft.  Musculoskeletal:        General: No swelling. Normal range of motion.     Cervical back: Normal range of motion and neck supple.  Skin:    General: Skin is warm and dry.     Coloration: Skin is not jaundiced.  Neurological:     General: No focal deficit   present.     Mental Status: She is alert and oriented to person, place, and time.  Psychiatric:        Mood and Affect: Mood normal.        Behavior: Behavior normal.      Assessment: *Personal history of adenomatous colon polyps *Family history of colon cancer in mother (died form colon cancer) and aunt *Chronic GERD-relatively well-controlled *Abdominal bloating *Gastric intestinal metaplasia  Plan: Will schedule for EGD to evaluate for peptic ulcer disease, esophagitis, gastritis, H. Pylori, duodenitis, or other. Will also evaluate for esophageal stricture, Schatzki's ring, esophageal web or other.   At the same time we will perform colonoscopy for family history of colon cancer as well as personal history of adenomatous colon polyps.  The risks including infection, bleed, or perforation as well as benefits, limitations, alternatives and imponderables have been reviewed with the patient. Potential for esophageal dilation, biopsy, etc. have also been reviewed.  Questions have been answered. All parties agreeable.  Continue pantoprazole as needed.  Low FODMAP diet printed out for patient for her abdominal bloating.  Thank you Dr. Simpson for the kind referral.  06/26/2022 2:31 PM   Disclaimer: This note was dictated with voice recognition software. Similar sounding words can inadvertently be transcribed and may not be  corrected upon review.  

## 2022-06-26 NOTE — Telephone Encounter (Signed)
Called pt insurance and spoke with provider services. Was advise no PA required for services.

## 2022-06-26 NOTE — H&P (View-Only) (Signed)
Primary Care Physician:  Fayrene Helper, MD Primary Gastroenterologist:  Dr. Abbey Chatters  Chief Complaint  Patient presents with   Gastroesophageal Reflux    Referred for gerd, dysphagia, and change in bowel habits. mother recently passed with colon cancer. Stools are more skinny than normal for the past few months.has some abdominal pain every now and then. Has one BM a day.     HPI:   Anna Cantu is a 57 y.o. female who presents to clinic today by referral from her PCP Dr. Moshe Cipro for evaluation.  Family history of colon cancer in mother who passed away as well as her aunt.  Last colonoscopy 2015 with multiple polyps removed, 1 tubular adenoma, the rest hyperplastic.  No melena hematochezia.  Does note some incomplete emptying at times.  Takes smooth move tea at times when she feels constipated which helps.  No unintentional weight loss.  Also notes some epigastric bloating and acid reflux.  Historically on pantoprazole though states she only takes this as needed.  Has tried to improve this with diet.  Symptoms mild to moderate, intermittent in nature.  Very occasional dysphagia.  Last 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  Completed treatment with antibiotics with subsequent negative stool antigen testing.    Past Medical History:  Diagnosis Date   Chronic back pain    Gastritis    Hemorrhoids    Nicotine addiction    Obesity    Ovarian cyst     Past Surgical History:  Procedure Laterality Date   btl and endometrial abaltion     CHOLECYSTECTOMY  2007   Dr. Aviva Signs    COLONOSCOPY   08/19/2008   Dr. Madolyn Frieze canal hemorrhoids.  Diminutive rectal polyp  status post cold biopsy and removal.  The remainder of rectal mucosa and colon was normal. Path: polypoid.    COLONOSCOPY WITH ESOPHAGOGASTRODUODENOSCOPY (EGD) N/A 07/08/2013   RMR: Rectal and colonic polyps-one tubular adenoma. colonic diverticulosis. Hemorroids-likely  source of hematochezia.  NEXT TCS IN 7 YEARS. Small hiatal hernia . Antral erosions;nonbleeding duodenal AVM; status post gastric biopsy with severe H.pylori   ESOPHAGOGASTRODUODENOSCOPY   08/19/2008   Dr. Volney American esophageal erosions, consistent with erosive reflux esophagitis/small HH/The remainder of her upper GI tract appeared normal   HEMORRHOID SURGERY N/A 06/11/2019   Procedure: HEMORRHOIDECTOMY EXTENSIVE;  Surgeon: Virl Cagey, MD;  Location: [facility];  Service: General;  Laterality: N/A;   left knee surgey- arthroscopy June 2011     Dr. Percell Miller    right carpal tunnel release  1991   Right Inguinal hernia repair  1982   right knee surgery  1993   TUBAL LIGATION      Current Outpatient Medications  Medication Sig Dispense Refill   acetaminophen (TYLENOL) 500 MG tablet Take 1 tablet (500 mg total) by mouth every 6 (six) hours as needed. 30 tablet 0   diclofenac Sodium (VOLTAREN) 1 % GEL Apply 2 g topically 4 (four) times daily. 350 g 1   ondansetron (ZOFRAN) 4 MG tablet Take 1 tablet (4 mg total) by mouth every 8 (eight) hours as needed for nausea or vomiting. 20 tablet 0   OVER THE COUNTER MEDICATION Vit C Vit D  Tumeric  Zinc biotin     pantoprazole (PROTONIX) 40 MG tablet Take 1 tablet (40 mg total) by mouth daily. (Patient not taking: Reported on 06/26/2022) 30 tablet 3   No current facility-administered medications for this visit.  Allergies as of 06/26/2022 - Review Complete 06/26/2022  Allergen Reaction Noted   Ciprofloxacin  07/16/2017   Flagyl [metronidazole]  07/16/2017   Amoxicillin Itching and Rash 09/16/2011    Family History  Problem Relation Age of Onset   Hypertension Mother    Stroke Mother    Hypertension Father    Breast cancer Paternal Grandmother    Colon cancer Neg Hx     Social History   Socioeconomic History   Marital status: Divorced    Spouse name: Not on file   Number of children: Not on file   Years of education: Not on file    Highest education level: Not on file  Occupational History   Not on file  Tobacco Use   Smoking status: Former    Packs/day: 0.50    Types: Cigarettes    Passive exposure: Past   Smokeless tobacco: Never   Tobacco comments:    quit Mar 03, 2013   Substance and Sexual Activity   Alcohol use: Yes    Comment: occasionally   Drug use: No   Sexual activity: Yes    Birth control/protection: Surgical  Other Topics Concern   Not on file  Social History Narrative   Not on file   Social Determinants of Health   Financial Resource Strain: Not on file  Food Insecurity: Not on file  Transportation Needs: Not on file  Physical Activity: Not on file  Stress: Not on file  Social Connections: Not on file  Intimate Partner Violence: Not on file    Subjective: Review of Systems  Constitutional:  Negative for chills and fever.  HENT:  Negative for congestion and hearing loss.   Eyes:  Negative for blurred vision and double vision.  Respiratory:  Negative for cough and shortness of breath.   Cardiovascular:  Negative for chest pain and palpitations.  Gastrointestinal:  Positive for constipation and heartburn. Negative for abdominal pain, blood in stool, diarrhea, melena and vomiting.       Bloating  Genitourinary:  Negative for dysuria and urgency.  Musculoskeletal:  Negative for joint pain and myalgias.  Skin:  Negative for itching and rash.  Neurological:  Negative for dizziness and headaches.  Psychiatric/Behavioral:  Negative for depression. The patient is not nervous/anxious.        Objective: BP 108/71 (BP Location: Left Arm, Patient Position: Sitting, Cuff Size: Large)   Pulse 66   Temp 98.5 F (36.9 C) (Oral)   Ht 5' 5"$  (1.651 m)   Wt 179 lb 11.2 oz (81.5 kg)   LMP 07/01/2013   BMI 29.90 kg/m  Physical Exam Constitutional:      Appearance: Normal appearance.  HENT:     Head: Normocephalic and atraumatic.  Eyes:     Extraocular Movements: Extraocular movements  intact.     Conjunctiva/sclera: Conjunctivae normal.  Cardiovascular:     Rate and Rhythm: Normal rate and regular rhythm.  Pulmonary:     Effort: Pulmonary effort is normal.     Breath sounds: Normal breath sounds.  Abdominal:     General: Bowel sounds are normal.     Palpations: Abdomen is soft.  Musculoskeletal:        General: No swelling. Normal range of motion.     Cervical back: Normal range of motion and neck supple.  Skin:    General: Skin is warm and dry.     Coloration: Skin is not jaundiced.  Neurological:     General: No focal deficit  present.     Mental Status: She is alert and oriented to person, place, and time.  Psychiatric:        Mood and Affect: Mood normal.        Behavior: Behavior normal.      Assessment: *Personal history of adenomatous colon polyps *Family history of colon cancer in mother (died form colon cancer) and aunt *Chronic GERD-relatively well-controlled *Abdominal bloating *Gastric intestinal metaplasia  Plan: Will schedule for EGD to evaluate for peptic ulcer disease, esophagitis, gastritis, H. Pylori, duodenitis, or other. Will also evaluate for esophageal stricture, Schatzki's ring, esophageal web or other.   At the same time we will perform colonoscopy for family history of colon cancer as well as personal history of adenomatous colon polyps.  The risks including infection, bleed, or perforation as well as benefits, limitations, alternatives and imponderables have been reviewed with the patient. Potential for esophageal dilation, biopsy, etc. have also been reviewed.  Questions have been answered. All parties agreeable.  Continue pantoprazole as needed.  Low FODMAP diet printed out for patient for her abdominal bloating.  Thank you Dr. Moshe Cipro for the kind referral.  06/26/2022 2:31 PM   Disclaimer: This note was dictated with voice recognition software. Similar sounding words can inadvertently be transcribed and may not be  corrected upon review.

## 2022-06-26 NOTE — Patient Instructions (Signed)
We will schedule you for upper endoscopy to further evaluate your chronic acid reflux, abdominal bloating, history of gastric intestinal metaplasia.  At the same time we will perform colonoscopy given your family history of colon cancer as well as personal history of colon polyps prior.  Continue pantoprazole as needed for your reflux.  I will print you off a low FODMAP diet which may give you some insight on certain fruits and vegetables to avoid that increas bloating/gas.  It was very nice meeting you today.  Dr. Abbey Chatters

## 2022-07-10 ENCOUNTER — Encounter (HOSPITAL_COMMUNITY): Payer: Self-pay

## 2022-07-10 ENCOUNTER — Encounter (HOSPITAL_COMMUNITY)
Admission: RE | Admit: 2022-07-10 | Discharge: 2022-07-10 | Disposition: A | Discharge status: Home / Self Care | Payer: No Typology Code available for payment source | Source: Ambulatory Visit | Attending: Internal Medicine | Admitting: Internal Medicine

## 2022-07-12 ENCOUNTER — Ambulatory Visit (HOSPITAL_COMMUNITY): Payer: No Typology Code available for payment source | Admitting: Anesthesiology

## 2022-07-12 ENCOUNTER — Ambulatory Visit (HOSPITAL_COMMUNITY)
Admission: RE | Admit: 2022-07-12 | Discharge: 2022-07-12 | Disposition: A | Discharge status: Home / Self Care | Payer: No Typology Code available for payment source | Attending: Internal Medicine | Admitting: Internal Medicine

## 2022-07-12 ENCOUNTER — Encounter (HOSPITAL_COMMUNITY): Payer: Self-pay

## 2022-07-12 ENCOUNTER — Encounter (HOSPITAL_COMMUNITY)
Admission: RE | Disposition: A | Discharge status: Home / Self Care | Payer: Self-pay | Source: Home / Self Care | Attending: Internal Medicine

## 2022-07-12 DIAGNOSIS — R140 Abdominal distension (gaseous): Secondary | ICD-10-CM | POA: Diagnosis not present

## 2022-07-12 DIAGNOSIS — K219 Gastro-esophageal reflux disease without esophagitis: Secondary | ICD-10-CM | POA: Diagnosis not present

## 2022-07-12 DIAGNOSIS — Z86010 Personal history of colonic polyps: Secondary | ICD-10-CM

## 2022-07-12 DIAGNOSIS — D124 Benign neoplasm of descending colon: Secondary | ICD-10-CM | POA: Diagnosis not present

## 2022-07-12 DIAGNOSIS — K5730 Diverticulosis of large intestine without perforation or abscess without bleeding: Secondary | ICD-10-CM | POA: Insufficient documentation

## 2022-07-12 DIAGNOSIS — K648 Other hemorrhoids: Secondary | ICD-10-CM | POA: Diagnosis not present

## 2022-07-12 DIAGNOSIS — Z87891 Personal history of nicotine dependence: Secondary | ICD-10-CM | POA: Diagnosis not present

## 2022-07-12 DIAGNOSIS — Z800 Family history of malignant neoplasm of digestive organs: Secondary | ICD-10-CM

## 2022-07-12 DIAGNOSIS — Z09 Encounter for follow-up examination after completed treatment for conditions other than malignant neoplasm: Secondary | ICD-10-CM

## 2022-07-12 DIAGNOSIS — D123 Benign neoplasm of transverse colon: Secondary | ICD-10-CM

## 2022-07-12 DIAGNOSIS — K2970 Gastritis, unspecified, without bleeding: Secondary | ICD-10-CM | POA: Insufficient documentation

## 2022-07-12 DIAGNOSIS — Z1211 Encounter for screening for malignant neoplasm of colon: Secondary | ICD-10-CM | POA: Diagnosis not present

## 2022-07-12 DIAGNOSIS — R1013 Epigastric pain: Secondary | ICD-10-CM | POA: Diagnosis present

## 2022-07-12 DIAGNOSIS — K635 Polyp of colon: Secondary | ICD-10-CM | POA: Insufficient documentation

## 2022-07-12 DIAGNOSIS — B9681 Helicobacter pylori [H. pylori] as the cause of diseases classified elsewhere: Secondary | ICD-10-CM | POA: Insufficient documentation

## 2022-07-12 DIAGNOSIS — R1310 Dysphagia, unspecified: Secondary | ICD-10-CM

## 2022-07-12 HISTORY — PX: ESOPHAGOGASTRODUODENOSCOPY (EGD) WITH PROPOFOL: SHX5813

## 2022-07-12 HISTORY — PX: POLYPECTOMY: SHX5525

## 2022-07-12 HISTORY — PX: BIOPSY: SHX5522

## 2022-07-12 HISTORY — PX: COLONOSCOPY WITH PROPOFOL: SHX5780

## 2022-07-12 SURGERY — COLONOSCOPY WITH PROPOFOL
Anesthesia: General

## 2022-07-12 MED ORDER — LACTATED RINGERS IV SOLN: INTRAVENOUS | Status: DC | PRN

## 2022-07-12 MED ORDER — LIDOCAINE HCL (CARDIAC) PF 100 MG/5ML IV SOSY
PREFILLED_SYRINGE | INTRAVENOUS | Status: DC | PRN
  Administered 2022-07-12: 50 mg via INTRATRACHEAL

## 2022-07-12 MED ORDER — PROPOFOL 10 MG/ML IV BOLUS
INTRAVENOUS | Status: DC | PRN
  Administered 2022-07-12: 100 mg via INTRAVENOUS

## 2022-07-12 MED ORDER — PROPOFOL 500 MG/50ML IV EMUL
INTRAVENOUS | Status: DC | PRN
  Administered 2022-07-12: 200 ug/kg/min via INTRAVENOUS

## 2022-07-12 NOTE — Transfer of Care (Signed)
Immediate Anesthesia Transfer of Care Note  Patient: Anna Cantu  Procedure(s) Performed: COLONOSCOPY WITH PROPOFOL ESOPHAGOGASTRODUODENOSCOPY (EGD) WITH PROPOFOL BIOPSY POLYPECTOMY  Patient Location: Short Stay  Anesthesia Type:General  Level of Consciousness: awake, alert , oriented, and patient cooperative  Airway & Oxygen Therapy: Patient Spontanous Breathing  Post-op Assessment: Report given to RN, Post -op Vital signs reviewed and stable, and Patient moving all extremities  Post vital signs: Reviewed and stable  Last Vitals:  Vitals Value Taken Time  BP 106/60 07/12/22 1205  Temp 36.8 C 07/12/22 1205  Pulse 64 07/12/22 1205  Resp 12 07/12/22 1205  SpO2 97 % 07/12/22 1205    Last Pain:  Vitals:   07/12/22 1205  TempSrc: Oral  PainSc: 0-No pain      Patients Stated Pain Goal: 8 ([id number])  Complications: No notable events documented.

## 2022-07-12 NOTE — Op Note (Signed)
[facility] Patient Name: Anna Cantu Procedure Date: 07/12/2022 11:24 AM MRN: [id number] Date of Birth: February 04, 1966 Attending MD: Elon Alas. Abbey Chatters , [place], [id number] CSN: [id number] Age: 57 Admit Type: Outpatient Procedure:                Upper GI endoscopy Indications:              Epigastric abdominal pain, Dysphagia, Heartburn Providers:                Elon Alas. Abbey Chatters, DO, Crystal Page, Randa Spike, Technician Referring MD:              Medicines:                See the Anesthesia note for documentation of the                            administered medications Complications:            No immediate complications. Estimated Blood Loss:     Estimated blood loss was minimal. Procedure:                Pre-Anesthesia Assessment:                           - The anesthesia plan was to use monitored                            anesthesia care (MAC).                           After obtaining informed consent, the endoscope was                            passed under direct vision. Throughout the                            procedure, the patient's blood pressure, pulse, and                            oxygen saturations were monitored continuously. The                            [id number]) scope was introduced through the                            mouth, and advanced to the second part of duodenum.                            The upper GI endoscopy was accomplished without                            difficulty. The patient tolerated the procedure                            well. Scope In:  11:35:42 AM Scope Out: 11:39:04 AM Total Procedure Duration: 0 hours 3 minutes 22 seconds  Findings:      The Z-line was regular and was found 39 cm from the incisors.      There is no endoscopic evidence of stenosis or stricture in the entire       esophagus.      Patchy mild inflammation characterized by erythema was found in the       gastric body  and in the gastric antrum. Biopsies were taken with a cold       forceps for Helicobacter pylori testing.      The duodenal bulb, first portion of the duodenum and second portion of       the duodenum were normal. Impression:               - Z-line regular, 39 cm from the incisors.                           - Gastritis. Biopsied.                           - Normal duodenal bulb, first portion of the                            duodenum and second portion of the duodenum. Moderate Sedation:      Per Anesthesia Care Recommendation:           - Patient has a contact number available for                            emergencies. The signs and symptoms of potential                            delayed complications were discussed with the                            patient. Return to normal activities tomorrow.                            Written discharge instructions were provided to the                            patient.                           - Resume previous diet.                           - Continue present medications.                           - Await pathology results.                           - Use Protonix (pantoprazole) 40 mg PO daily.                           - Return to GI clinic in 3 months.  Procedure Code(s):        --- Professional ---                           [phone], Esophagogastroduodenoscopy, flexible,                            transoral; with biopsy, single or multiple Diagnosis Code(s):        --- Professional ---                           K29.70, Gastritis, unspecified, without bleeding                           R10.13, Epigastric pain                           R13.10, Dysphagia, unspecified                           R12, Heartburn CPT copyright 2022 American Medical Association. All rights reserved. The codes documented in this report are preliminary and upon coder review may  be revised to meet current compliance requirements. Elon Alas. Abbey Chatters, DO [place]  Abbey Chatters, DO 07/12/2022 11:41:07 AM This report has been signed electronically. Number of Addenda: 0

## 2022-07-12 NOTE — Interval H&P Note (Signed)
History and Physical Interval Note:  07/12/2022 11:01 AM  Anna Cantu  has presented today for surgery, with the diagnosis of HX polyps, FH CRC, gerd, bloating, hx gastric intestinal metaplasia.  The various methods of treatment have been discussed with the patient and family. After consideration of risks, benefits and other options for treatment, the patient has consented to  Procedure(s) with comments: COLONOSCOPY WITH PROPOFOL (N/A) - 12:00pm, asa 2 ESOPHAGOGASTRODUODENOSCOPY (EGD) WITH PROPOFOL (N/A) as a surgical intervention.  The patient's history has been reviewed, patient examined, no change in status, stable for surgery.  I have reviewed the patient's chart and labs.  Questions were answered to the patient's satisfaction.     Eloise Harman

## 2022-07-12 NOTE — Discharge Instructions (Signed)
EGD Discharge instructions Please read the instructions outlined below and refer to this sheet in the next few weeks. These discharge instructions provide you with general information on caring for yourself after you leave the hospital. Your doctor may also give you specific instructions. While your treatment has been planned according to the most current medical practices available, unavoidable complications occasionally occur. If you have any problems or questions after discharge, please call your doctor. ACTIVITY You may resume your regular activity but move at a slower pace for the next 24 hours.  Take frequent rest periods for the next 24 hours.  Walking will help expel (get rid of) the air and reduce the bloated feeling in your abdomen.  No driving for 24 hours (because of the anesthesia (medicine) used during the test).  You may shower.  Do not sign any important legal documents or operate any machinery for 24 hours (because of the anesthesia used during the test).  NUTRITION Drink plenty of fluids.  You may resume your normal diet.  Begin with a light meal and progress to your normal diet.  Avoid alcoholic beverages for 24 hours or as instructed by your caregiver.  MEDICATIONS You may resume your normal medications unless your caregiver tells you otherwise.  WHAT YOU CAN EXPECT TODAY You may experience abdominal discomfort such as a feeling of fullness or "gas" pains.  FOLLOW-UP Your doctor will discuss the results of your test with you.  SEEK IMMEDIATE MEDICAL ATTENTION IF ANY OF THE FOLLOWING OCCUR: Excessive nausea (feeling sick to your stomach) and/or vomiting.  Severe abdominal pain and distention (swelling).  Trouble swallowing.  Temperature over 101 F (37.8 C).  Rectal bleeding or vomiting of blood.     Colonoscopy Discharge Instructions  Read the instructions outlined below and refer to this sheet in the next few weeks. These discharge instructions provide you with  general information on caring for yourself after you leave the hospital. Your doctor may also give you specific instructions. While your treatment has been planned according to the most current medical practices available, unavoidable complications occasionally occur.   ACTIVITY You may resume your regular activity, but move at a slower pace for the next 24 hours.  Take frequent rest periods for the next 24 hours.  Walking will help get rid of the air and reduce the bloated feeling in your belly (abdomen).  No driving for 24 hours (because of the medicine (anesthesia) used during the test).   Do not sign any important legal documents or operate any machinery for 24 hours (because of the anesthesia used during the test).  NUTRITION Drink plenty of fluids.  You may resume your normal diet as instructed by your doctor.  Begin with a light meal and progress to your normal diet. Heavy or fried foods are harder to digest and may make you feel sick to your stomach (nauseated).  Avoid alcoholic beverages for 24 hours or as instructed.  MEDICATIONS You may resume your normal medications unless your doctor tells you otherwise.  WHAT YOU CAN EXPECT TODAY Some feelings of bloating in the abdomen.  Passage of more gas than usual.  Spotting of blood in your stool or on the toilet paper.  IF YOU HAD POLYPS REMOVED DURING THE COLONOSCOPY: No aspirin products for 7 days or as instructed.  No alcohol for 7 days or as instructed.  Eat a soft diet for the next 24 hours.  FINDING OUT THE RESULTS OF YOUR TEST Not all test results are  available during your visit. If your test results are not back during the visit, make an appointment with your caregiver to find out the results. Do not assume everything is normal if you have not heard from your caregiver or the medical facility. It is important for you to follow up on all of your test results.  SEEK IMMEDIATE MEDICAL ATTENTION IF: You have more than a spotting of  blood in your stool.  Your belly is swollen (abdominal distention).  You are nauseated or vomiting.  You have a temperature over 101.  You have abdominal pain or discomfort that is severe or gets worse throughout the day.   Your EGD revealed mild amount inflammation in your stomach.  I took biopsies of this to rule out infection with a bacteria called H. pylori.  Await pathology results, my office will contact you.  Your esophagus was wide open, no evidence of strictures or tightenings.  I elected not to dilate today.  Small bowel appeared normal.  Continue pantoprazole as needed.  Your colonoscopy revealed 6 polyp(s) which I removed successfully. Await pathology results, my office will contact you. I recommend repeating colonoscopy in 3-5 years for surveillance purposes depending on pathology results.   You also have diverticulosis and internal hemorrhoids. I would recommend increasing fiber in your diet or adding OTC Benefiber/Metamucil. Be sure to drink at least 4 to 6 glasses of water daily. Follow-up with GI in 6 months   I hope you have a great rest of your week!  Elon Alas. Abbey Chatters, D.O. Gastroenterology and Hepatology [facility]

## 2022-07-12 NOTE — Anesthesia Preprocedure Evaluation (Signed)
Anesthesia Evaluation  Patient identified by MRN, date of birth, ID band Patient awake    Reviewed: Allergy & Precautions, H&P , NPO status , Patient's Chart, lab work & pertinent test results, reviewed documented beta blocker date and time   Airway Mallampati: II  TM Distance: >3 FB Neck ROM: full    Dental no notable dental hx.    Pulmonary neg pulmonary ROS, former smoker   Pulmonary exam normal breath sounds clear to auscultation       Cardiovascular Exercise Tolerance: Good negative cardio ROS  Rhythm:regular Rate:Normal     Neuro/Psych  Headaches  Anxiety      Neuromuscular disease negative neurological ROS  negative psych ROS   GI/Hepatic negative GI ROS, Neg liver ROS,GERD  ,,  Endo/Other  negative endocrine ROS    Renal/GU negative Renal ROS  negative genitourinary   Musculoskeletal   Abdominal   Peds  Hematology negative hematology ROS (+)   Anesthesia Other Findings   Reproductive/Obstetrics negative OB ROS                             Anesthesia Physical Anesthesia Plan  ASA: 2  Anesthesia Plan: General   Post-op Pain Management:    Induction:   PONV Risk Score and Plan: Propofol infusion  Airway Management Planned:   Additional Equipment:   Intra-op Plan:   Post-operative Plan:   Informed Consent: I have reviewed the patients History and Physical, chart, labs and discussed the procedure including the risks, benefits and alternatives for the proposed anesthesia with the patient or authorized representative who has indicated his/her understanding and acceptance.     Dental Advisory Given  Plan Discussed with: CRNA  Anesthesia Plan Comments:        Anesthesia Quick Evaluation

## 2022-07-12 NOTE — Op Note (Signed)
[facility] Patient Name: Anna Cantu Procedure Date: 07/12/2022 11:23 AM MRN: [id number] Date of Birth: [birth year] Attending MD: Elon Alas. Abbey Chatters , [place], [id number] CSN: [id number] Age: 57 Admit Type: Outpatient Procedure:                Colonoscopy Indications:              Colon cancer screening in patient at increased                            risk: Colorectal cancer in mother, Surveillance:                            Personal history of adenomatous polyps on last                            colonoscopy > 5 years ago Providers:                Elon Alas. Abbey Chatters, DO, Crystal Page, Randa Spike, Technician Referring MD:              Medicines:                See the Anesthesia note for documentation of the                            administered medications Complications:            No immediate complications. Estimated Blood Loss:     Estimated blood loss was minimal. Procedure:                Pre-Anesthesia Assessment:                           - The anesthesia plan was to use monitored                            anesthesia care (MAC).                           After obtaining informed consent, the colonoscope                            was passed under direct vision. Throughout the                            procedure, the patient's blood pressure, pulse, and                            oxygen saturations were monitored continuously. The                            [id number] scope was introduced through                            the anus and advanced to the the cecum,  identified                            by appendiceal orifice and ileocecal valve. The                            colonoscopy was performed without difficulty. The                            patient tolerated the procedure well. The quality                            of the bowel preparation was evaluated using the                            BBPS [facility] Bowel  Preparation Scale) with scores                            of: Right Colon = 2 (minor amount of residual                            staining, small fragments of stool and/or opaque                            liquid, but mucosa seen well), Transverse Colon = 2                            (minor amount of residual staining, small fragments                            of stool and/or opaque liquid, but mucosa seen                            well) and Left Colon = 2 (minor amount of residual                            staining, small fragments of stool and/or opaque                            liquid, but mucosa seen well). The total BBPS score                            equals 6. Fair. Scope In: 11:42:35 AM Scope Out: 12:00:06 PM Scope Withdrawal Time: 0 hours 14 minutes 23 seconds  Total Procedure Duration: 0 hours 17 minutes 31 seconds  Findings:      The perianal and digital rectal examinations were normal.      Non-bleeding internal hemorrhoids were found during endoscopy.      Multiple large-mouthed and small-mouthed diverticula were found in the       sigmoid colon, descending colon and transverse colon.      Four sessile polyps were found in the descending colon and transverse       colon. The polyps were 4 to 7 mm in size. These polyps were  removed with       a cold snare. Resection and retrieval were complete.      Two sessile polyps were found in the sigmoid colon. The polyps were 5 to       6 mm in size. These polyps were removed with a cold snare. Resection and       retrieval were complete.      The exam was otherwise without abnormality. Impression:               - Non-bleeding internal hemorrhoids.                           - Diverticulosis in the sigmoid colon, in the                            descending colon and in the transverse colon.                           - Four 4 to 7 mm polyps in the descending colon and                            in the transverse colon, removed  with a cold snare.                            Resected and retrieved.                           - Two 5 to 6 mm polyps in the sigmoid colon,                            removed with a cold snare. Resected and retrieved.                           - The examination was otherwise normal. Moderate Sedation:      Per Anesthesia Care Recommendation:           - Patient has a contact number available for                            emergencies. The signs and symptoms of potential                            delayed complications were discussed with the                            patient. Return to normal activities tomorrow.                            Written discharge instructions were provided to the                            patient.                           - Resume previous diet.                           -  Continue present medications.                           - Await pathology results.                           - Repeat colonoscopy in 3 - 5 years for                            surveillance.                           - Return to GI clinic in 3 months. Procedure Code(s):        --- Professional ---                           [phone], Colonoscopy, flexible; with removal of                            tumor(s), polyp(s), or other lesion(s) by snare                            technique Diagnosis Code(s):        --- Professional ---                           Z80.0, Family history of malignant neoplasm of                            digestive organs                           Z86.010, Personal history of colonic polyps                           K64.8, Other hemorrhoids                           D12.4, Benign neoplasm of descending colon                           D12.3, Benign neoplasm of transverse colon (hepatic                            flexure or splenic flexure)                           D12.5, Benign neoplasm of sigmoid colon                           K57.30, Diverticulosis of large  intestine without                            perforation or abscess without bleeding CPT copyright 2022 American Medical Association. All rights reserved. The codes documented in this report are preliminary and upon coder review may  be revised to meet current compliance requirements. Elon Alas. Abbey Chatters, DO [place]  Abbey Chatters, DO 07/12/2022 12:02:44 PM This report has been signed electronically. Number of Addenda: 0

## 2022-07-13 NOTE — Anesthesia Postprocedure Evaluation (Signed)
Anesthesia Post Note  Patient: Anna Cantu  Procedure(s) Performed: COLONOSCOPY WITH PROPOFOL ESOPHAGOGASTRODUODENOSCOPY (EGD) WITH PROPOFOL BIOPSY POLYPECTOMY  Patient location during evaluation: Phase II Anesthesia Type: General Level of consciousness: awake Pain management: pain level controlled Vital Signs Assessment: post-procedure vital signs reviewed and stable Respiratory status: spontaneous breathing and respiratory function stable Cardiovascular status: blood pressure returned to baseline and stable Postop Assessment: no headache and no apparent nausea or vomiting Anesthetic complications: no Comments: Late entry   No notable events documented.   Last Vitals:  Vitals:   07/12/22 1051 07/12/22 1205  BP: 107/69 106/60  Pulse: (!) 54 64  Resp: 12 12  Temp: 36.9 C 36.8 C  SpO2: 100% 97%    Last Pain:  Vitals:   07/12/22 1205  TempSrc: Oral  PainSc: 0-No pain                 Louann Sjogren

## 2022-07-16 ENCOUNTER — Telehealth: Payer: Self-pay

## 2022-07-16 LAB — SURGICAL PATHOLOGY

## 2022-07-16 NOTE — Telephone Encounter (Signed)
Pt phoned advising that yesterday when she got a call checking up on her from the procedure (EGD) she felt ok but this morning she states her throat was hurting and all she can do is drink hot tea and she has gargled salt water. I asked the pt do she think it was from the procedure and she stated yes. She wants t know what OTC measures she can try. Please advise

## 2022-07-17 ENCOUNTER — Telehealth: Payer: Self-pay | Admitting: Internal Medicine

## 2022-07-17 ENCOUNTER — Encounter: Payer: Self-pay | Admitting: Family Medicine

## 2022-07-17 MED ORDER — METRONIDAZOLE 500 MG PO TABS: 500.0000 mg | ORAL_TABLET | Freq: Three times a day (TID) | ORAL | 0 refills | Status: AC

## 2022-07-17 MED ORDER — TETRACYCLINE HCL 500 MG PO CAPS: 500.0000 mg | ORAL_CAPSULE | Freq: Four times a day (QID) | ORAL | 0 refills | Status: AC

## 2022-07-17 MED ORDER — PANTOPRAZOLE SODIUM 40 MG PO TBEC: 40.0000 mg | DELAYED_RELEASE_TABLET | Freq: Two times a day (BID) | ORAL | 5 refills | Status: DC

## 2022-07-17 MED ORDER — BISMUTH 262 MG PO CHEW: 2.0000 | CHEWABLE_TABLET | Freq: Four times a day (QID) | ORAL | 0 refills | Status: AC

## 2022-07-17 NOTE — Telephone Encounter (Signed)
Please see telephone note

## 2022-07-17 NOTE — Telephone Encounter (Signed)
She may have some soreness from passing the endoscope into her upper GI tract.  Would recommend over-the-counter Mylanta to help.  This should steadily improve.

## 2022-07-17 NOTE — Telephone Encounter (Signed)
noted 

## 2022-07-17 NOTE — Telephone Encounter (Signed)
FYI: Phoned and advised the pt of the instructions above. Pt states she didn't have this until after the EGD but she also states that there is URI symptoms with this and a cough. Pt also stated her PCP told her to contact us regarding this. I advised the pt that this sounded like URI and that when the results of her EGD come to me I will call her because I didn't want her to wait another day or 2 when her PCP can prescribe something for her to take. Please advise

## 2022-07-17 NOTE — Telephone Encounter (Signed)
I attempted to call patient.  She did not answer her phone.  Patient has H. pylori gastritis.  I will send in 14-day course of tetracycline 500 mg 4 times daily, metronidazole 500 mg 3 times daily.   Increase pantoprazole to 40 mg twice daily.   I will also send in bismuth to take 4 times daily as well.    Follow-up as previously scheduled to discuss eradication testing.  Thank you

## 2022-07-18 ENCOUNTER — Encounter (HOSPITAL_COMMUNITY): Payer: Self-pay | Admitting: Internal Medicine

## 2022-07-18 NOTE — Telephone Encounter (Signed)
Phoned and LMOVM for the pt to return call 

## 2022-07-19 ENCOUNTER — Telehealth: Payer: Self-pay | Admitting: Internal Medicine

## 2022-07-19 NOTE — Telephone Encounter (Signed)
See other phone note

## 2022-07-19 NOTE — Telephone Encounter (Signed)
Patient left a message said she was returning a call

## 2022-07-19 NOTE — Telephone Encounter (Signed)
Phoned and LMOVM for the pt to return call 

## 2022-07-24 ENCOUNTER — Other Ambulatory Visit: Payer: Self-pay | Admitting: Internal Medicine

## 2022-07-24 MED ORDER — FLUCONAZOLE 150 MG PO TABS: ORAL_TABLET | ORAL | 0 refills | Status: DC

## 2022-07-24 NOTE — Telephone Encounter (Signed)
Pt wants a Rx for Fluconazole sent in to her pharmacy

## 2022-07-24 NOTE — Telephone Encounter (Signed)
Sent pt a message in her MyChart

## 2022-07-25 ENCOUNTER — Encounter: Payer: Self-pay | Admitting: Radiology

## 2022-07-30 ENCOUNTER — Other Ambulatory Visit: Payer: Self-pay

## 2022-07-30 ENCOUNTER — Encounter: Payer: Self-pay | Admitting: Family Medicine

## 2022-07-30 MED ORDER — DICLOFENAC SODIUM 1 % EX GEL: 2.0000 g | Freq: Four times a day (QID) | CUTANEOUS | 1 refills | Status: DC

## 2022-07-30 MED ORDER — ONDANSETRON HCL 4 MG PO TABS: 4.0000 mg | ORAL_TABLET | Freq: Three times a day (TID) | ORAL | 0 refills | Status: DC | PRN

## 2022-07-31 NOTE — Telephone Encounter (Signed)
Voltaren sent patient aware about other prescriptions

## 2022-07-31 NOTE — Telephone Encounter (Signed)
Patient aware.

## 2022-10-01 ENCOUNTER — Ambulatory Visit: Payer: No Typology Code available for payment source | Admitting: Family Medicine

## 2022-10-11 ENCOUNTER — Other Ambulatory Visit: Payer: Self-pay

## 2022-10-11 ENCOUNTER — Ambulatory Visit: Payer: No Typology Code available for payment source | Admitting: Family Medicine

## 2022-10-11 ENCOUNTER — Encounter: Payer: Self-pay | Admitting: Family Medicine

## 2022-10-11 VITALS — BP 103/68 | HR 70 | Ht 65.0 in | Wt 175.0 lb

## 2022-10-11 DIAGNOSIS — M5440 Lumbago with sciatica, unspecified side: Secondary | ICD-10-CM | POA: Diagnosis not present

## 2022-10-11 DIAGNOSIS — J302 Other seasonal allergic rhinitis: Secondary | ICD-10-CM

## 2022-10-11 DIAGNOSIS — F4321 Adjustment disorder with depressed mood: Secondary | ICD-10-CM | POA: Diagnosis not present

## 2022-10-11 DIAGNOSIS — R300 Dysuria: Secondary | ICD-10-CM | POA: Diagnosis not present

## 2022-10-11 DIAGNOSIS — G8929 Other chronic pain: Secondary | ICD-10-CM | POA: Diagnosis not present

## 2022-10-11 LAB — POCT URINALYSIS DIP (CLINITEK)
Bilirubin, UA: NEGATIVE
Glucose, UA: NEGATIVE mg/dL
Ketones, POC UA: NEGATIVE mg/dL
Leukocytes, UA: NEGATIVE
Nitrite, UA: NEGATIVE
POC PROTEIN,UA: NEGATIVE
Spec Grav, UA: 1.01 (ref 1.010–1.025)
Urobilinogen, UA: 0.2 E.U./dL
pH, UA: 6 (ref 5.0–8.0)

## 2022-10-11 MED ORDER — KETOROLAC TROMETHAMINE 60 MG/2ML IM SOLN
60.0000 mg | Freq: Once | INTRAMUSCULAR | Status: AC
  Administered 2022-10-11: 60 mg via INTRAMUSCULAR

## 2022-10-11 MED ORDER — MONTELUKAST SODIUM 10 MG PO TABS: 10.0000 mg | ORAL_TABLET | Freq: Every day | ORAL | 3 refills | Status: DC

## 2022-10-11 MED ORDER — PREDNISONE 5 MG (21) PO TBPK: 5.0000 mg | ORAL_TABLET | ORAL | 0 refills | Status: DC

## 2022-10-11 MED ORDER — METHYLPREDNISOLONE ACETATE 80 MG/ML IJ SUSP
80.0000 mg | Freq: Once | INTRAMUSCULAR | Status: AC
  Administered 2022-10-11: 80 mg via INTRAMUSCULAR

## 2022-10-11 NOTE — Patient Instructions (Addendum)
F/u in 5 months, call if you need me sooner   Toradol 60 mg and depo medrol 80 mg IM in officefor pain and also for allergies  Please schedule mammogram at checkout   Prednisone dose pack and Singulair are prescribed for allergies   No antibiotic needed  Urine is not infected  Sudafed, take one tablet once daily for next 3 to 5 days to reduce drainage  I will get in touch re support group and recommend therapy through your job   Thanks for choosing [facility], we consider it a privelige to serve you.

## 2022-10-15 ENCOUNTER — Encounter: Payer: Self-pay | Admitting: Family Medicine

## 2022-10-15 DIAGNOSIS — R300 Dysuria: Secondary | ICD-10-CM | POA: Insufficient documentation

## 2022-10-15 DIAGNOSIS — M5440 Lumbago with sciatica, unspecified side: Secondary | ICD-10-CM | POA: Insufficient documentation

## 2022-10-15 DIAGNOSIS — F4321 Adjustment disorder with depressed mood: Secondary | ICD-10-CM | POA: Insufficient documentation

## 2022-10-15 NOTE — Assessment & Plan Note (Signed)
Lost her Mother to cancer approx 9 months ago experiencing grief, will seek  counselling through job, looking for support groups also

## 2022-10-15 NOTE — Assessment & Plan Note (Signed)
Uncontrolled.Toradol and depo medrol administered IM in the office , to be followed by a short course of oral prednisone   

## 2022-10-15 NOTE — Assessment & Plan Note (Signed)
Uncontrolled , sepo medrol, short course prednisone , sudafed 1 daily for 5 days then as needed, commit to daily singulair

## 2022-10-15 NOTE — Progress Notes (Signed)
   Anna Cantu     MRN: [id number]      DOB: May 30, 1965  Chief Complaint  Patient presents with   Follow-up    Rash x 1 week, sinus issues, knee and back pain, urinary frequency     HPI Anna Cantu is here for follow up and re-evaluation of chronic medical conditions, medication management and review of any available recent lab and radiology data.  Preventive health is updated, specifically  Cancer screening and Immunization.   Concerns as listed  above Grieving recent loss of her Mother, plans to get therapy Increased nasal and sinus congestion x 1 week, no fever or chills  ROS Denies recent fever or chills. Denies , ear pain or sore throat. Denies chest congestion, productive cough or wheezing. Denies chest pains, palpitations and leg swelling Denies abdominal pain, nausea, vomiting,diarrhea or constipation.   . . Denies headaches, seizures, numbness, or tingling.  Itchy rash folllowing excess sun exposure at the beach .   PE  BP 103/68 (BP Location: Right Arm, Patient Position: Sitting, Cuff Size: Large)   Pulse 70   Ht 5\' 5"  (1.651 m)   Wt 175 lb 0.6 oz (79.4 kg)   LMP 07/01/2013   SpO2 98%   BMI 29.13 kg/m   Patient alert and oriented and in no cardiopulmonary distress.Tearful at times  HEENT: No facial asymmetry, EOMI,     Neck supple .Nasal congestion , no sinus tenderness , no cervical adenopathy, TM clear Bilateraly  Chest: Clear to auscultation bilaterally.  CVS: S1, S2 no murmurs, no S3.Regular rate.  ABD: Soft non tender.   Ext: No edema  MS: decreased ROM  ROM spine, and knees.  Skin: Intact, erythema and macular rash on forearms.  Psych: Good eye contact, tearful at times and muldly  anxious mildly depressed appearing.  CNS: CN 2-12 intact, power,  normal throughout.no focal deficits noted.   Assessment & Plan  Low back pain with sciatica Uncontrolled.Toradol and depo medrol administered IM in the office , to be followed by a  short course of oral prednisone .   Dysuria CCUA normal, no infection  Seasonal allergies Uncontrolled , sepo medrol, short course prednisone , sudafed 1 daily for 5 days then as needed, commit to daily singulair  Feeling grief Lost her Mother to cancer approx 9 months ago experiencing grief, will seek  counselling through job, looking for support groups also

## 2022-10-15 NOTE — Assessment & Plan Note (Signed)
CCUA normal, no infection

## 2022-10-19 MED ORDER — BETAMETHASONE DIPROPIONATE 0.05 % EX CREA: TOPICAL_CREAM | CUTANEOUS | 0 refills | Status: DC

## 2022-10-19 MED ORDER — CYCLOBENZAPRINE HCL 10 MG PO TABS: ORAL_TABLET | ORAL | 2 refills | Status: DC

## 2022-11-04 ENCOUNTER — Encounter: Payer: Self-pay | Admitting: Family Medicine

## 2022-12-09 ENCOUNTER — Encounter: Payer: Self-pay | Admitting: Family Medicine

## 2022-12-24 ENCOUNTER — Encounter: Payer: Self-pay | Admitting: Gastroenterology

## 2023-01-21 ENCOUNTER — Encounter: Payer: Self-pay | Admitting: Family Medicine

## 2023-01-27 NOTE — Progress Notes (Unsigned)
Patient Office Visit   Subjective   Patient ID: Anna Cantu, female    DOB: March 31, 1966  Age: 57 y.o. MRN: [id number]  CC: No chief complaint on file.   HPI Anna Cantu 57 year old female, presents to the clinic for   She  has a past medical history of Chronic back pain, Gastritis, Hemorrhoids, Nicotine addiction, Obesity, and Ovarian cyst.  HPI    Outpatient Encounter Medications as of 01/28/2023  Medication Sig   acetaminophen (TYLENOL) 500 MG tablet Take 1 tablet (500 mg total) by mouth every 6 (six) hours as needed. (Patient taking differently: Take 1,000 mg by mouth every 6 (six) hours as needed for moderate pain.)   Ascorbic Acid (VITAMIN C) 1000 MG tablet Take 1,000 mg by mouth daily.   betamethasone dipropionate 0.05 % cream Apply sparingly twice daily to affected areas   Biotin 1000 MCG tablet Take 1,000 mcg by mouth daily.   Carboxymethylcellul-Glycerin (LUBRICATING EYE DROPS OP) Place 1 drop into both eyes daily as needed (dry eyes).   Cholecalciferol (DIALYVITE VITAMIN D 5000) 125 MCG (5000 UT) capsule Take 10,000 Units by mouth every other day.   cyclobenzaprine (FLEXERIL) 10 MG tablet Take one tablet at bedtime as needed, for back and neck spasm   diclofenac Sodium (VOLTAREN) 1 % GEL Apply 2 g topically 4 (four) times daily.   ibuprofen (ADVIL) 200 MG tablet Take 600-800 mg by mouth every 6 (six) hours as needed for moderate pain.   montelukast (SINGULAIR) 10 MG tablet Take 1 tablet (10 mg total) by mouth at bedtime.   Multiple Minerals-Vitamins (CAL MAG ZINC +D3 PO) Take 1 tablet by mouth daily.   ondansetron (ZOFRAN) 4 MG tablet Take 1 tablet (4 mg total) by mouth every 8 (eight) hours as needed for nausea or vomiting.   pantoprazole (PROTONIX) 40 MG tablet Take 1 tablet (40 mg total) by mouth 2 (two) times daily.   Potassium 99 MG TABS Take 99 mg by mouth daily.   predniSONE (STERAPRED UNI-PAK 21 TAB) 5 MG (21) TBPK tablet Take 1 tablet (5 mg  total) by mouth as directed. Use as directed   TURMERIC PO Take 1,500 mg by mouth daily.   zinc gluconate 50 MG tablet Take 50 mg by mouth daily.   No facility-administered encounter medications on file as of 01/28/2023.    Past Surgical History:  Procedure Laterality Date   BIOPSY  07/12/2022   Procedure: BIOPSY;  Surgeon: Lanelle Bal, DO;  Location: [facility];  Service: Endoscopy;;   btl and endometrial abaltion     CHOLECYSTECTOMY  2007   Dr. Franky Macho    COLONOSCOPY   08/19/2008   Dr. Marlowe Alt canal hemorrhoids.  Diminutive rectal polyp  status post cold biopsy and removal.  The remainder of rectal mucosa and colon was normal. Path: polypoid.    COLONOSCOPY WITH ESOPHAGOGASTRODUODENOSCOPY (EGD) N/A 07/08/2013   RMR: Rectal and colonic polyps-one tubular adenoma. colonic diverticulosis. Hemorroids-likely source of hematochezia.  NEXT TCS IN 7 YEARS. Small hiatal hernia . Antral erosions;nonbleeding duodenal AVM; status post gastric biopsy with severe H.pylori   COLONOSCOPY WITH PROPOFOL N/A 07/12/2022   Procedure: COLONOSCOPY WITH PROPOFOL;  Surgeon: Lanelle Bal, DO;  Location: [facility];  Service: Endoscopy;  Laterality: N/A;  12:00pm, asa 2   ESOPHAGOGASTRODUODENOSCOPY   08/19/2008   Dr. Rourk:Distal esophageal erosions, consistent with erosive reflux esophagitis/small HH/The remainder of her upper GI tract appeared normal   ESOPHAGOGASTRODUODENOSCOPY (EGD)  WITH PROPOFOL N/A 07/12/2022   Procedure: ESOPHAGOGASTRODUODENOSCOPY (EGD) WITH PROPOFOL;  Surgeon: Lanelle Bal, DO;  Location: [facility];  Service: Endoscopy;  Laterality: N/A;   HEMORRHOID SURGERY N/A 06/11/2019   Procedure: HEMORRHOIDECTOMY EXTENSIVE;  Surgeon: Lucretia Roers, MD;  Location: [facility];  Service: General;  Laterality: N/A;   left knee surgey- arthroscopy June 2011     Dr. Eulah Pont    POLYPECTOMY  07/12/2022   Procedure: POLYPECTOMY;  Surgeon: Lanelle Bal, DO;  Location: [facility];  Service: Endoscopy;;   right carpal tunnel release  1991   Right Inguinal hernia repair  1982   right knee surgery  1993   TUBAL LIGATION      ROS    Objective    LMP 07/01/2013   Physical Exam    Assessment & Plan:  There are no diagnoses linked to this encounter.  No follow-ups on file.   Anna Lederer Newman Nip, FNP

## 2023-01-27 NOTE — Patient Instructions (Signed)
        Great to see you today.   - Please take medications as prescribed. - Follow up with your primary health provider if any health concerns arises. - If symptoms worsen please contact your primary care provider and/or visit the emergency department.  

## 2023-01-28 ENCOUNTER — Encounter: Payer: Self-pay | Admitting: Family Medicine

## 2023-01-28 ENCOUNTER — Ambulatory Visit (INDEPENDENT_AMBULATORY_CARE_PROVIDER_SITE_OTHER): Payer: No Typology Code available for payment source | Admitting: Family Medicine

## 2023-01-28 ENCOUNTER — Ambulatory Visit (HOSPITAL_COMMUNITY)
Admission: RE | Admit: 2023-01-28 | Discharge: 2023-01-28 | Disposition: A | Discharge status: Home / Self Care | Payer: No Typology Code available for payment source | Source: Ambulatory Visit | Attending: Family Medicine | Admitting: Family Medicine

## 2023-01-28 VITALS — BP 107/72 | HR 77 | Ht 65.0 in | Wt 180.0 lb

## 2023-01-28 DIAGNOSIS — M545 Low back pain, unspecified: Secondary | ICD-10-CM

## 2023-01-28 DIAGNOSIS — L299 Pruritus, unspecified: Secondary | ICD-10-CM

## 2023-01-28 DIAGNOSIS — Z0182 Encounter for allergy testing: Secondary | ICD-10-CM

## 2023-01-28 MED ORDER — HYDROXYZINE HCL 10 MG PO TABS: 10.0000 mg | ORAL_TABLET | Freq: Three times a day (TID) | ORAL | 0 refills | Status: DC | PRN

## 2023-01-28 MED ORDER — METHOCARBAMOL 750 MG PO TABS: 750.0000 mg | ORAL_TABLET | Freq: Two times a day (BID) | ORAL | 1 refills | Status: DC | PRN

## 2023-01-28 MED ORDER — KETOROLAC TROMETHAMINE 60 MG/2ML IM SOLN: 60.0000 mg | Freq: Once | INTRAMUSCULAR | Status: AC

## 2023-01-28 NOTE — Assessment & Plan Note (Signed)
Xray ordered awaiting results will follow up Toradal injection given today Trial on Robaxin PRN Discussed medication desired effects, potential side effects. Non pharmacological interventions include rest, avoid twisting, improper bending, straining lower back. Demonstration of proper body mechanics. Alternate ice and heat. Recommend stretching back and legs. Follow up for worsening or persistent symptoms. Patient verbalizes understanding regarding plan of care and all questions answered.

## 2023-01-29 ENCOUNTER — Encounter: Payer: Self-pay | Admitting: Family Medicine

## 2023-02-11 ENCOUNTER — Encounter: Payer: Self-pay | Admitting: Family Medicine

## 2023-02-11 ENCOUNTER — Ambulatory Visit (INDEPENDENT_AMBULATORY_CARE_PROVIDER_SITE_OTHER): Payer: No Typology Code available for payment source | Admitting: Family Medicine

## 2023-02-11 VITALS — BP 103/61 | HR 71 | Ht 65.0 in | Wt 185.0 lb

## 2023-02-11 DIAGNOSIS — L309 Dermatitis, unspecified: Secondary | ICD-10-CM | POA: Insufficient documentation

## 2023-02-11 DIAGNOSIS — F419 Anxiety disorder, unspecified: Secondary | ICD-10-CM

## 2023-02-11 DIAGNOSIS — J3089 Other allergic rhinitis: Secondary | ICD-10-CM

## 2023-02-11 DIAGNOSIS — G8929 Other chronic pain: Secondary | ICD-10-CM | POA: Insufficient documentation

## 2023-02-11 DIAGNOSIS — M5441 Lumbago with sciatica, right side: Secondary | ICD-10-CM

## 2023-02-11 DIAGNOSIS — M5442 Lumbago with sciatica, left side: Secondary | ICD-10-CM

## 2023-02-11 MED ORDER — HYDROCODONE-ACETAMINOPHEN 5-325 MG PO TABS: ORAL_TABLET | ORAL | 0 refills | Status: DC

## 2023-02-11 MED ORDER — HYDROXYZINE HCL 10 MG PO TABS: ORAL_TABLET | ORAL | 3 refills | Status: DC

## 2023-02-11 MED ORDER — HYDROCODONE-ACETAMINOPHEN 5-325MG PREPACK (~~LOC~~: ORAL_TABLET | ORAL | 0 refills | Status: DC

## 2023-02-11 MED ORDER — PREGABALIN 25 MG PO CAPS: 25.0000 mg | ORAL_CAPSULE | Freq: Two times a day (BID) | ORAL | 5 refills | Status: DC

## 2023-02-11 MED ORDER — FLUCONAZOLE 150 MG PO TABS: ORAL_TABLET | ORAL | 0 refills | Status: DC

## 2023-02-11 MED ORDER — BETAMETHASONE DIPROPIONATE 0.05 % EX CREA: TOPICAL_CREAM | CUTANEOUS | 0 refills | Status: DC

## 2023-02-11 MED ORDER — PREDNISONE 20 MG PO TABS: 20.0000 mg | ORAL_TABLET | Freq: Two times a day (BID) | ORAL | 0 refills | Status: DC

## 2023-02-11 NOTE — Assessment & Plan Note (Signed)
Betmethasone twice daily for 3 days to affected area, then as needed, use sparingly

## 2023-02-11 NOTE — Patient Instructions (Addendum)
Annual exam in 4 months, call if you need me sooner  Medication prescribed for pain and rash Hydrocodone prescribed for no more than 1 per week, and to last till next visit Once you start pain management if that is recommended, they will prescribe all pain meds  You are referred for MRI back and to see the neurosurgeon  once you have this done, appt will be requested after the MRI ids done  Careful not to fall  Thanks for choosing [facility], we consider it a privelige to serve you.

## 2023-02-11 NOTE — Assessment & Plan Note (Signed)
Uncontrolled , short sharp course of prednisone

## 2023-02-11 NOTE — Assessment & Plan Note (Signed)
Start once daily bedtime hydroxyzine

## 2023-02-11 NOTE — Progress Notes (Signed)
Anna Cantu     MRN: [id number]      DOB: 1965/06/15  Chief Complaint  Patient presents with   Follow-up    Follow up back and knee pain , L side facial pain sinuses/ear    HPI Anna Cantu is here for follow up and re-evaluation of chronic medical conditions, medication management and review of any available recent lab and radiology data.  Preventive health is updated, specifically  Cancer screening and Immunization.  Refuses flu vaccine  3 month h/o uncontrolled midline LBP radiating to both feet, right worse, new  weakness and numbness in Right leg noted in past 3 months, no incontinence of urine or stool.c/o increased left knee pain and instability , however wants to focus on back first C/o intermittent itching generalized which she attribute to anxiety, does respond to low dose hydroxyzine which she has used sparingly. Itching over area of right buttock which she had concerns about re possible infection   ROS Denies recent fever or chills. Denies sinus pressure, nasal congestion, ear pain or sore throat. Denies chest congestion, productive cough or wheezing. Denies chest pains, palpitations and leg swelling Denies abdominal pain, nausea, vomiting,diarrhea or constipation.   Denies dysuria, frequency, hesitancy or incontinence. . Denies headaches, seizures,  Denies skin break down or rash.   PE  BP 103/61 (BP Location: Right Arm, Patient Position: Sitting, Cuff Size: Normal)   Pulse 71   Ht 5\' 5"  (1.651 m)   Wt 185 lb (83.9 kg)   LMP 07/01/2013   SpO2 96%   BMI 30.79 kg/m   Patient alert and oriented and in no cardiopulmonary distress.  HEENT: No facial asymmetry, EOMI,     Neck supple .Left maxillary tenderness and nasal congestion  Chest: Clear to auscultation bilaterally.  CVS: S1, S2 no murmurs, no S3.Regular rate.  ABD: Soft non tender.   Ext: No edema  MS: decreased  ROM lumbar spine,  and knees left greater than right  Skin: Intact, no  ulcerations or rash noted.  Psych: Good eye contact, normal affect. Memory intact not anxious or depressed appearing. CNS: Grade 4 power in lower extremities with reduced sensation in RLE.   Assessment & Plan  Chronic bilateral low back pain with bilateral sciatica Increasing pain in past 3 months, with concerning symptoms of weakness and reduced sensation in RLE. Needs MRI, short course of high dose prednisone, lyrica twice daily and low dose hydrocodone sparingly once per week at most prescribed  Allergic rhinitis Uncontrolled , short sharp course of prednisone  Anxiety Start once daily bedtime hydroxyzine  Dermatitis Betmethasone twice daily for 3 days to affected area, then as needed, use sparingly

## 2023-02-11 NOTE — Assessment & Plan Note (Signed)
Increasing pain in past 3 months, with concerning symptoms of weakness and reduced sensation in RLE. Needs MRI, short course of high dose prednisone, lyrica twice daily and low dose hydrocodone sparingly once per week at most prescribed

## 2023-02-19 ENCOUNTER — Ambulatory Visit (HOSPITAL_COMMUNITY)
Admission: RE | Admit: 2023-02-19 | Discharge: 2023-02-19 | Disposition: A | Discharge status: Home / Self Care | Payer: No Typology Code available for payment source | Source: Ambulatory Visit | Attending: Family Medicine | Admitting: Family Medicine

## 2023-02-19 DIAGNOSIS — M5441 Lumbago with sciatica, right side: Secondary | ICD-10-CM | POA: Diagnosis present

## 2023-02-19 DIAGNOSIS — G8929 Other chronic pain: Secondary | ICD-10-CM | POA: Insufficient documentation

## 2023-02-19 DIAGNOSIS — M5442 Lumbago with sciatica, left side: Secondary | ICD-10-CM | POA: Diagnosis present

## 2023-03-11 ENCOUNTER — Encounter: Payer: Self-pay | Admitting: Family Medicine

## 2023-03-12 ENCOUNTER — Ambulatory Visit (INDEPENDENT_AMBULATORY_CARE_PROVIDER_SITE_OTHER): Payer: No Typology Code available for payment source | Admitting: Internal Medicine

## 2023-03-12 ENCOUNTER — Encounter: Payer: Self-pay | Admitting: Internal Medicine

## 2023-03-12 ENCOUNTER — Other Ambulatory Visit: Payer: Self-pay

## 2023-03-12 VITALS — BP 106/66 | HR 72 | Temp 98.7°F | Ht 64.0 in | Wt 186.2 lb

## 2023-03-12 DIAGNOSIS — K2970 Gastritis, unspecified, without bleeding: Secondary | ICD-10-CM | POA: Diagnosis not present

## 2023-03-12 DIAGNOSIS — D126 Benign neoplasm of colon, unspecified: Secondary | ICD-10-CM

## 2023-03-12 DIAGNOSIS — K5904 Chronic idiopathic constipation: Secondary | ICD-10-CM

## 2023-03-12 DIAGNOSIS — K219 Gastro-esophageal reflux disease without esophagitis: Secondary | ICD-10-CM

## 2023-03-12 DIAGNOSIS — K5909 Other constipation: Secondary | ICD-10-CM | POA: Diagnosis not present

## 2023-03-12 DIAGNOSIS — R140 Abdominal distension (gaseous): Secondary | ICD-10-CM

## 2023-03-12 DIAGNOSIS — B9681 Helicobacter pylori [H. pylori] as the cause of diseases classified elsewhere: Secondary | ICD-10-CM

## 2023-03-12 DIAGNOSIS — Z860101 Personal history of adenomatous and serrated colon polyps: Secondary | ICD-10-CM

## 2023-03-12 DIAGNOSIS — Z800 Family history of malignant neoplasm of digestive organs: Secondary | ICD-10-CM

## 2023-03-12 MED ORDER — SILVER SULFADIAZINE 1 % EX CREA: TOPICAL_CREAM | CUTANEOUS | 0 refills | Status: DC

## 2023-03-12 NOTE — Telephone Encounter (Signed)
Refill sent.

## 2023-03-12 NOTE — Progress Notes (Signed)
Primary Care Physician:  Kerri Perches, MD Primary Gastroenterologist:  Dr. Marletta Lor  Chief Complaint  Patient presents with   Follow-up    Wants to be tested for bacteria in esophagus. Having issues with reflux, takes pepto every now and then if it gets bad.     HPI:   Anna Cantu is a 57 y.o. female who presents to clinic today for follow up visit.   Family history of colon cancer in mother who passed away as well as her aunt:  Colonoscopy 07/12/2022 with multiple polyps removed, mix of hyperplastic and tubular adenomas.  Recall 5 years  Chronic GERD, epigastric bloating and acid reflux:  Historically on pantoprazole though states she only takes this as needed.  Has tried to improve this with diet.  Symptoms mild to moderate, intermittent in nature.  Very occasional dysphagia.  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  Completed treatment with antibiotics with subsequent negative stool antigen testing.  EGD 07/12/2022 with gastritis, biopsies positive for H. pylori.  Completed treatment with quad therapy with tetracycline, metronidazole, bismuth, pantoprazole.  Did not follow-up for eradication testing.  Chronic constipation: new, mild, previously well-controlled on fiber supplement.  Requesting advice today in this regard.  Past Medical History:  Diagnosis Date   Chronic back pain    Gastritis    Hemorrhoids    Nicotine addiction    Obesity    Ovarian cyst     Past Surgical History:  Procedure Laterality Date   BIOPSY  07/12/2022   Procedure: BIOPSY;  Surgeon: Lanelle Bal, DO;  [facility]: [facility];  Service: Endoscopy;;   btl and endometrial abaltion     CHOLECYSTECTOMY  2007   Dr. Franky Macho    COLONOSCOPY   08/19/2008   Dr. Marlowe Alt canal hemorrhoids.  Diminutive rectal polyp  status post cold biopsy and removal.  The remainder of rectal mucosa and colon was normal. Path: polypoid.    COLONOSCOPY WITH  ESOPHAGOGASTRODUODENOSCOPY (EGD) N/A 07/08/2013   RMR: Rectal and colonic polyps-one tubular adenoma. colonic diverticulosis. Hemorroids-likely source of hematochezia.  NEXT TCS IN 7 YEARS. Small hiatal hernia . Antral erosions;nonbleeding duodenal AVM; status post gastric biopsy with severe H.pylori   COLONOSCOPY WITH PROPOFOL N/A 07/12/2022   Procedure: COLONOSCOPY WITH PROPOFOL;  Surgeon: Lanelle Bal, DO;  Location: [facility];  Service: Endoscopy;  Laterality: N/A;  12:00pm, asa 2   ESOPHAGOGASTRODUODENOSCOPY   08/19/2008   Dr. Marny Lowenstein esophageal erosions, consistent with erosive reflux esophagitis/small HH/The remainder of her upper GI tract appeared normal   ESOPHAGOGASTRODUODENOSCOPY (EGD) WITH PROPOFOL N/A 07/12/2022   Procedure: ESOPHAGOGASTRODUODENOSCOPY (EGD) WITH PROPOFOL;  Surgeon: Lanelle Bal, DO;  Location: [facility];  Service: Endoscopy;  Laterality: N/A;   HEMORRHOID SURGERY N/A 06/11/2019   Procedure: HEMORRHOIDECTOMY EXTENSIVE;  Surgeon: Lucretia Roers, MD;  Location: [facility];  Service: General;  Laterality: N/A;   left knee surgey- arthroscopy June 2011     Dr. Eulah Pont    POLYPECTOMY  07/12/2022   Procedure: POLYPECTOMY;  Surgeon: Lanelle Bal, DO;  Location: [facility];  Service: Endoscopy;;   right carpal tunnel release  1991   Right Inguinal hernia repair  1982   right knee surgery  1993   TUBAL LIGATION      Current Outpatient Medications  Medication Sig Dispense Refill   acetaminophen (TYLENOL) 500 MG tablet Take 1 tablet (500 mg total) by mouth every 6 (six) hours as  needed. (Patient taking differently: Take 1,000 mg by mouth every 6 (six) hours as needed for moderate pain (pain score 4-6).) 30 tablet 0   HYDROcodone-acetaminophen (NORCO/VICODIN) 5-325 MG tablet Take one tablet by mouth twice daily as needed, for uncontrolled back pain 10 tablet 0   hydrOXYzine (ATARAX) 10 MG tablet Take one tablet by mouth at bedtime , as needed, for  anxiety or / and itching 30 tablet 3   Multiple Vitamins-Minerals (MULTIVITAMIN ADULTS 50+ PO) Take 1 capsule by mouth daily.     pantoprazole (PROTONIX) 40 MG tablet Take 1 tablet (40 mg total) by mouth 2 (two) times daily. (Patient taking differently: Take 40 mg by mouth daily.) 60 tablet 5   No current facility-administered medications for this visit.    Allergies as of 03/12/2023   (No Known Allergies)    Family History  Problem Relation Age of Onset   Hypertension Mother    Stroke Mother    Hypertension Father    Breast cancer Paternal Grandmother    Colon cancer Neg Hx     Social History   Socioeconomic History   Marital status: Divorced    Spouse name: Not on file   Number of children: Not on file   Years of education: Not on file   Highest education level: Not on file  Occupational History   Not on file  Tobacco Use   Smoking status: Former    Current packs/day: 0.50    Types: Cigarettes    Passive exposure: Past   Smokeless tobacco: Never   Tobacco comments:    quit Mar 03, 2013   Substance and Sexual Activity   Alcohol use: Yes    Comment: occasionally   Drug use: No   Sexual activity: Yes    Birth control/protection: Surgical  Other Topics Concern   Not on file  Social History Narrative   Not on file   Social Determinants of Health   Financial Resource Strain: Not on file  Food Insecurity: Not on file  Transportation Needs: Not on file  Physical Activity: Not on file  Stress: Not on file  Social Connections: Not on file  Intimate Partner Violence: Not on file    Subjective: Review of Systems  Constitutional:  Negative for chills and fever.  HENT:  Negative for congestion and hearing loss.   Eyes:  Negative for blurred vision and double vision.  Respiratory:  Negative for cough and shortness of breath.   Cardiovascular:  Negative for chest pain and palpitations.  Gastrointestinal:  Positive for constipation and heartburn. Negative for  abdominal pain, blood in stool, diarrhea, melena and vomiting.       Bloating  Genitourinary:  Negative for dysuria and urgency.  Musculoskeletal:  Negative for joint pain and myalgias.  Skin:  Negative for itching and rash.  Neurological:  Negative for dizziness and headaches.  Psychiatric/Behavioral:  Negative for depression. The patient is not nervous/anxious.        Objective: BP 106/66 (BP Location: Left Arm, Patient Position: Sitting, Cuff Size: Normal)   Pulse 72   Temp 98.7 F (37.1 C) (Oral)   Ht 5\' 4"  (1.626 m)   Wt 186 lb 3.2 oz (84.5 kg)   LMP 07/01/2013   SpO2 99%   BMI 31.96 kg/m  Physical Exam Constitutional:      Appearance: Normal appearance.  HENT:     Head: Normocephalic and atraumatic.  Eyes:     Extraocular Movements: Extraocular movements intact.  Conjunctiva/sclera: Conjunctivae normal.  Cardiovascular:     Rate and Rhythm: Normal rate and regular rhythm.  Pulmonary:     Effort: Pulmonary effort is normal.     Breath sounds: Normal breath sounds.  Abdominal:     General: Bowel sounds are normal.     Palpations: Abdomen is soft.  Musculoskeletal:        General: No swelling. Normal range of motion.     Cervical back: Normal range of motion and neck supple.  Skin:    General: Skin is warm and dry.     Coloration: Skin is not jaundiced.  Neurological:     General: No focal deficit present.     Mental Status: She is alert and oriented to person, place, and time.  Psychiatric:        Mood and Affect: Mood normal.        Behavior: Behavior normal.      Assessment/Plan:  1.  H. pylori gastritis-completed quad therapy.  Did not follow-up for eradication testing.  Will order H. pylori breath test today.  Patient told to hold pantoprazole for 10 to 14 days prior to testing.  No Pepto-Bismol during this time either.  Will call with results.  2.  Epigastric pain, GERD, bloating-improved on pantoprazole daily.  Resume after H. pylori  testing.  3.  Adenomatous colon polyps, family history of colon cancer-colonoscopy recall 2028.  4.  Chronic constipation-recommended Benefiber.  Ensure adequate hydration.  Follow-up in 3 to 4 months  03/12/2023 3:32 PM   Disclaimer: This note was dictated with voice recognition software. Similar sounding words can inadvertently be transcribed and may not be corrected upon review.

## 2023-03-12 NOTE — Patient Instructions (Signed)
I am going to order a H. pylori breath test at [facility].  You will need to hold your pantoprazole for 10 days prior to undergoing the test.  You can restart it immediately afterwards however.  Do not take Pepto-Bismol during this time either.  If you are having reflux symptoms during the 10-day period.  You can take over-the-counter Pepcid (famotidine) 1-2 times daily.  For your constipation recommend starting Benefiber.  Ensure that you are drinking at least 6 glasses of water daily.  Follow-up in 3 to 4 months.  It was very nice seeing you again today.  Dr. Marletta Lor

## 2023-03-24 ENCOUNTER — Telehealth: Payer: Self-pay | Admitting: Family Medicine

## 2023-03-24 ENCOUNTER — Encounter: Payer: Self-pay | Admitting: Family Medicine

## 2023-03-24 ENCOUNTER — Ambulatory Visit: Payer: No Typology Code available for payment source | Admitting: Internal Medicine

## 2023-03-24 NOTE — Telephone Encounter (Signed)
Patient called asked can lab be ordered lab bladder infection going today in [place]

## 2023-03-26 ENCOUNTER — Encounter: Payer: Self-pay | Admitting: Family Medicine

## 2023-03-26 ENCOUNTER — Ambulatory Visit (INDEPENDENT_AMBULATORY_CARE_PROVIDER_SITE_OTHER): Payer: No Typology Code available for payment source | Admitting: Family Medicine

## 2023-03-26 ENCOUNTER — Ambulatory Visit: Payer: Self-pay | Admitting: Internal Medicine

## 2023-03-26 VITALS — BP 109/67 | HR 80 | Ht 64.0 in | Wt 183.1 lb

## 2023-03-26 DIAGNOSIS — G8929 Other chronic pain: Secondary | ICD-10-CM

## 2023-03-26 DIAGNOSIS — M5440 Lumbago with sciatica, unspecified side: Secondary | ICD-10-CM

## 2023-03-26 DIAGNOSIS — J029 Acute pharyngitis, unspecified: Secondary | ICD-10-CM | POA: Diagnosis not present

## 2023-03-26 DIAGNOSIS — J3089 Other allergic rhinitis: Secondary | ICD-10-CM

## 2023-03-26 DIAGNOSIS — R300 Dysuria: Secondary | ICD-10-CM | POA: Diagnosis not present

## 2023-03-26 LAB — URINALYSIS
Bilirubin, UA: NEGATIVE
Glucose, UA: NEGATIVE
Ketones, UA: NEGATIVE
Nitrite, UA: POSITIVE — AB
Protein,UA: NEGATIVE
Specific Gravity, UA: 1.02 (ref 1.005–1.030)
Urobilinogen, Ur: 0.2 mg/dL (ref 0.2–1.0)
pH, UA: 6.5 (ref 5.0–7.5)

## 2023-03-26 LAB — H. PYLORI BREATH TEST: H pylori Breath Test: NEGATIVE

## 2023-03-26 MED ORDER — AZELASTINE HCL 0.1 % NA SOLN: 2.0000 | Freq: Two times a day (BID) | NASAL | 12 refills | Status: DC

## 2023-03-26 MED ORDER — MAGIC MOUTHWASH: 5.0000 mL | Freq: Three times a day (TID) | ORAL | 0 refills | Status: AC | PRN

## 2023-03-26 MED ORDER — FLUCONAZOLE 150 MG PO TABS: ORAL_TABLET | ORAL | 0 refills | Status: DC

## 2023-03-26 MED ORDER — SULFAMETHOXAZOLE-TRIMETHOPRIM 800-160 MG PO TABS: 1.0000 | ORAL_TABLET | Freq: Two times a day (BID) | ORAL | 0 refills | Status: DC

## 2023-03-26 NOTE — Patient Instructions (Addendum)
F/u as before, call if you need me sooner  Urine is being tested, we will send you the result  Astelin spray is prescribed  for allergies, suppressant, and Dukes mouthwash for sore throat  Septra is prescribed for sinusitis and pharyngitis, this will also treat most urinary tract infections

## 2023-03-28 LAB — URINE CULTURE: Organism ID, Bacteria: NO GROWTH

## 2023-04-04 NOTE — Assessment & Plan Note (Signed)
Uncontrolled symptoms start Astelin daily

## 2023-04-04 NOTE — Assessment & Plan Note (Signed)
No current flare 

## 2023-04-04 NOTE — Assessment & Plan Note (Signed)
Symptomatic with abn UA, septra prescribed for presumed UTI, will f/u c/s

## 2023-04-04 NOTE — Progress Notes (Signed)
    Anna Cantu     MRN: [id number]      DOB: 1965/07/25  Chief Complaint  Patient presents with   Urinary Tract Infection    UTI x 1 week    HPI Ms. Glasper is here with a 1 week h/o dysuria and frequency ,home test for UTI positive, taking azo with some symptom relief. No fever or flank pain C/o increased nasal drainage, clear, no ear pain, c/o sore throat , no chills or body aches ROS . Denies chest congestion, productive cough or wheezing. Denies chest pains, palpitations and leg swelling Denies abdominal pain, nausea, vomiting,diarrhea or constipation.   Denies dysuria, frequency, hesitancy or incontinence. Denies uncontrolled  joint pain, swelling and limitation in mobility. Denies headaches, seizures, numbness, or tingling. Denies depression, anxiety or insomnia. Denies skin break down or rash.   PE  BP 109/67 (BP Location: Right Arm, Patient Position: Sitting, Cuff Size: Large)   Pulse 80   Ht 5\' 4"  (1.626 m)   Wt 183 lb 1.3 oz (83 kg)   LMP 07/01/2013   SpO2 98%   BMI 31.43 kg/m   Patient alert and oriented and in no cardiopulmonary distress.  HEENT: No facial asymmetry, EOMI,     Neck supple .No sinus tenderness , no cervical adenopathy, npo  Chest: Clear to auscultation bilaterally.  CVS: S1, S2 no murmurs, no S3.Regular rate.  ABD: Soft non tender.   Ext: No edema  MS: Adequate ROM spine, shoulders, hips and knees.  Skin: Intact, no ulcerations or rash noted.  Psych: Good eye contact, normal affect. Memory intact not anxious or depressed appearing.  CNS: CN 2-12 intact, power,  normal throughout.no focal deficits noted.   Assessment & Plan  Dysuria Symptomatic with abn UA, septra prescribed for presumed UTI, will f/u c/s  Allergic rhinitis Uncontrolled symptoms start Astelin daily  Sore throat Dukes mouthwash is prescribed, an salt water gargles recommended  Low back pain with sciatica No current flare

## 2023-04-04 NOTE — Assessment & Plan Note (Signed)
Dukes mouthwash is prescribed, an salt water gargles recommended

## 2023-05-06 ENCOUNTER — Encounter: Payer: Self-pay | Admitting: Family Medicine

## 2023-05-09 ENCOUNTER — Telehealth: Payer: Self-pay

## 2023-05-09 MED ORDER — PREGABALIN 25 MG PO CAPS: 25.0000 mg | ORAL_CAPSULE | Freq: Two times a day (BID) | ORAL | 3 refills | Status: AC

## 2023-05-09 NOTE — Addendum Note (Signed)
Addended by: Kerri Perches on: 05/09/2023 04:47 PM   Modules accepted: Orders

## 2023-05-09 NOTE — Telephone Encounter (Signed)
Lyrica is sent and pt is aware

## 2023-05-09 NOTE — Telephone Encounter (Signed)
Copied from CRM [id number]. Topic: Clinical - Medication Question >> May 09, 2023 11:04 AM Almira Coaster wrote: Reason for CRM: Patient submitted a request through MyChart for pre gablmin and has not gotten a response. She would like to speak with Dr.Simpson or a nurse.   Please advise ?

## 2023-05-12 ENCOUNTER — Ambulatory Visit (HOSPITAL_COMMUNITY)
Admission: RE | Admit: 2023-05-12 | Discharge: 2023-05-12 | Disposition: A | Discharge status: Home / Self Care | Payer: No Typology Code available for payment source | Source: Ambulatory Visit | Attending: Gastroenterology | Admitting: Gastroenterology

## 2023-05-12 ENCOUNTER — Encounter: Payer: Self-pay | Admitting: Gastroenterology

## 2023-05-12 ENCOUNTER — Ambulatory Visit (INDEPENDENT_AMBULATORY_CARE_PROVIDER_SITE_OTHER): Payer: No Typology Code available for payment source | Admitting: Gastroenterology

## 2023-05-12 VITALS — BP 107/74 | HR 64 | Temp 98.6°F | Ht 64.0 in | Wt 189.6 lb

## 2023-05-12 DIAGNOSIS — R140 Abdominal distension (gaseous): Secondary | ICD-10-CM | POA: Insufficient documentation

## 2023-05-12 DIAGNOSIS — K219 Gastro-esophageal reflux disease without esophagitis: Secondary | ICD-10-CM | POA: Diagnosis not present

## 2023-05-12 DIAGNOSIS — K5909 Other constipation: Secondary | ICD-10-CM | POA: Diagnosis not present

## 2023-05-12 DIAGNOSIS — R1030 Lower abdominal pain, unspecified: Secondary | ICD-10-CM | POA: Insufficient documentation

## 2023-05-12 DIAGNOSIS — Z8619 Personal history of other infectious and parasitic diseases: Secondary | ICD-10-CM

## 2023-05-12 DIAGNOSIS — R109 Unspecified abdominal pain: Secondary | ICD-10-CM | POA: Diagnosis not present

## 2023-05-12 DIAGNOSIS — R110 Nausea: Secondary | ICD-10-CM

## 2023-05-12 NOTE — Patient Instructions (Signed)
Go for abdominal xray today. We will be in touch with results as available.  Take Linzess once daily on empty stomach to move bowels well, start after your xray. You may only need to take a couple to reset and get back to your normal bowel function. Samples provided.

## 2023-05-12 NOTE — Progress Notes (Signed)
GI Office Note    Referring Provider: Kerri Perches, MD Primary Care Physician:  Kerri Perches, MD  Primary Gastroenterologist: Hennie Duos. Marletta Lor, DO   Chief Complaint   Chief Complaint  Patient presents with   Abdominal Pain    Abd pain and chills when having a BM     History of Present Illness   Anna Cantu is a 57 y.o. female presenting today for problem visit.    Last seen in October 2024.  She has a family history of colon cancer, personal history of GERD, epigastric bloating, chronic constipation.  Since her last office visit she completed H. pylori breath test which confirmed eradication of H. pylori.  Has not been feeling well since Thanksgiving. Out of town for the holiday. Forgot to take her pantoprazole with her and was off medication for about a week. Also couldn't have a BM. Sciatica flared and had to take pain medication as well. Once home she started taking smooth move to try and restart her bowels. Last Thursday, abdominal cramping, chills. Works from home, but notes if she had to go in to work she wouldn't have been able to. Never had anything like this before. Tried Pepto but felt worse. Nausea. Feeling of gas coming up. Feel like couldn't eat. All week really couldn't eat, especially breakfast. Tolerates lunch better. At this time feeling a lot of bloating. BMs moving somewhat better last couple of days. Added back a lot of fiber. Cramping is better but not resolved. Heartburn ok.     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  Completed treatment with antibiotics with subsequent negative stool antigen testing.   EGD 07/12/2022 with gastritis, biopsies positive for H. pylori.  Completed treatment with quad therapy with tetracycline, metronidazole, bismuth, pantoprazole.   Colonoscopy 07/12/2022 with multiple polyps removed, mix of hyperplastic and tubular adenomas. Recall 5 years    Wt Readings from Last 10  Encounters:  05/12/23 189 lb 9.6 oz (86 kg)  03/26/23 183 lb 1.3 oz (83 kg)  03/12/23 186 lb 3.2 oz (84.5 kg)  02/11/23 185 lb (83.9 kg)  01/28/23 180 lb 0.6 oz (81.7 kg)  10/11/22 175 lb 0.6 oz (79.4 kg)  06/26/22 179 lb 11.2 oz (81.5 kg)  04/30/22 176 lb 1.3 oz (79.9 kg)  01/16/22 172 lb (78 kg)  05/17/21 158 lb 0.6 oz (71.7 kg)     Medications   Current Outpatient Medications  Medication Sig Dispense Refill   acetaminophen (TYLENOL) 500 MG tablet Take 1 tablet (500 mg total) by mouth every 6 (six) hours as needed. (Patient taking differently: Take 1,000 mg by mouth every 6 (six) hours as needed for moderate pain (pain score 4-6).) 30 tablet 0   HYDROcodone-acetaminophen (NORCO/VICODIN) 5-325 MG tablet Take one tablet by mouth twice daily as needed, for uncontrolled back pain 10 tablet 0   hydrOXYzine (ATARAX) 10 MG tablet Take one tablet by mouth at bedtime , as needed, for anxiety or / and itching 30 tablet 3   Multiple Vitamins-Minerals (MULTIVITAMIN ADULTS 50+ PO) Take 1 capsule by mouth daily.     pregabalin (LYRICA) 25 MG capsule Take 1 capsule (25 mg total) by mouth 2 (two) times daily. 60 capsule 3   pantoprazole (PROTONIX) 40 MG tablet Take 1 tablet (40 mg total) by mouth 2 (two) times daily. (Patient not taking: Reported on 03/26/2023) 60 tablet 5   No current facility-administered medications for this visit.  Allergies   Allergies as of 05/12/2023   (No Known Allergies)          Review of Systems   General: Negative for anorexia, weight loss, fever, chills, fatigue, weakness. ENT: Negative for hoarseness, difficulty swallowing , nasal congestion. CV: Negative for chest pain, angina, palpitations, dyspnea on exertion, peripheral edema.  Respiratory: Negative for dyspnea at rest, dyspnea on exertion, cough, sputum, wheezing.  GI: See history of present illness. GU:  Negative for dysuria, hematuria, urinary incontinence, urinary frequency, nocturnal urination.   Endo: Negative for unusual weight change.     Physical Exam   BP 107/74   Pulse 64   Temp 98.6 F (37 C)   Ht 5\' 4"  (1.626 m)   Wt 189 lb 9.6 oz (86 kg)   LMP 07/01/2013   BMI 32.54 kg/m    General: Well-nourished, well-developed in no acute distress.  Eyes: No icterus. Mouth: Oropharyngeal mucosa moist and pink  Heart: Regular rate and rhythm, no murmurs rubs or gallops.  Abdomen: Bowel sounds are normal, no hepatosplenomegaly or masses,  no abdominal bruits or hernia , no rebound or guarding. Mild abdominal distention and lower abdominal tenderness Rectal: not performed  Extremities: No lower extremity edema. No clubbing or deformities. Neuro: Alert and oriented x 4   Skin: Warm and dry, no jaundice.   Psych: Alert and cooperative, normal mood and affect.  Labs   none Imaging Studies   No results found.  Assessment/Plan:   GERD, bloating, nausea: flare of symptoms off pantoprazole for one week. Some improvement back on ppi. History of h.pylori with confirmed eradication.  -continue pantoprazole 40mg  daily  Chronic constipation/abdominal cramping: recent flare while out of town on vacation, dietary changes. Doubt bowel obstruction. Suspect obstipation, ileus, less like diverticulitis but remains possibility. -abd xray 1 view -linzess once daily for couple of days at least. She prefers natural remedies for long term management of her constipation.   Leanna Battles. Melvyn Neth, MHS, PA-C [facility]

## 2023-06-13 ENCOUNTER — Ambulatory Visit: Payer: No Typology Code available for payment source | Admitting: Family Medicine

## 2023-06-18 ENCOUNTER — Encounter: Payer: Self-pay | Admitting: Family Medicine

## 2023-06-18 ENCOUNTER — Telehealth (INDEPENDENT_AMBULATORY_CARE_PROVIDER_SITE_OTHER): Payer: No Typology Code available for payment source | Admitting: Family Medicine

## 2023-06-18 DIAGNOSIS — R21 Rash and other nonspecific skin eruption: Secondary | ICD-10-CM

## 2023-06-19 ENCOUNTER — Telehealth: Payer: Self-pay | Admitting: Family Medicine

## 2023-06-19 ENCOUNTER — Telehealth (INDEPENDENT_AMBULATORY_CARE_PROVIDER_SITE_OTHER): Payer: No Typology Code available for payment source | Admitting: Family Medicine

## 2023-06-19 ENCOUNTER — Encounter: Payer: Self-pay | Admitting: Family Medicine

## 2023-06-19 VITALS — Ht 64.0 in | Wt 165.0 lb

## 2023-06-19 DIAGNOSIS — L239 Allergic contact dermatitis, unspecified cause: Secondary | ICD-10-CM

## 2023-06-19 DIAGNOSIS — Z13220 Encounter for screening for lipoid disorders: Secondary | ICD-10-CM

## 2023-06-19 DIAGNOSIS — E0789 Other specified disorders of thyroid: Secondary | ICD-10-CM

## 2023-06-19 DIAGNOSIS — L853 Xerosis cutis: Secondary | ICD-10-CM

## 2023-06-19 DIAGNOSIS — E559 Vitamin D deficiency, unspecified: Secondary | ICD-10-CM

## 2023-06-19 MED ORDER — HYDROCORTISONE 0.5 % EX CREA: TOPICAL_CREAM | CUTANEOUS | 0 refills | Status: AC

## 2023-06-19 MED ORDER — CETIRIZINE HCL 10 MG PO TABS: 10.0000 mg | ORAL_TABLET | Freq: Every day | ORAL | 2 refills | Status: DC

## 2023-06-19 NOTE — Telephone Encounter (Signed)
LVM to get pt on for 1 pm today per Lodema Hong

## 2023-06-19 NOTE — Progress Notes (Signed)
Virtual Visit via Video Note  I connected with TALASIA SAULTER on 06/19/23 at  1:00 PM EST by a video enabled telemedicine application and verified that I am speaking with the correct person using two identifiers.  Location: Patient: home Provider: work   I discussed the limitations of evaluation and management by telemedicine and the availability of in person appointments. The patient expressed understanding and agreed to proceed.  History of Present Illness: Irritation, itching and dryness around eyes and on jaws x 2 to3 weeks, no new personal care products or  cleansing agents or makeup No fevr , chills or drainage, no redness   Observations/Objective: Ht 5\' 4"  (1.626 m)   Wt 165 lb (74.8 kg) Comment: pt reported  LMP 07/01/2013   BMI 28.32 kg/m  Good communication with no confusion and intact memory. Alert and oriented x 3 No signs of respiratory distress during speech Skin: hyperpigmentation and dry skin noted around eyes, mild erythema on cheeks  Assessment and Plan:  Allergic dermatitis Start daily zyrtec and sparing use of hydrocortisone cream  Dry skin dermatitis May use cocnut oil twice daily to affected skin  I provided 11 minutes of non-face-to-face time during this encounter.   Syliva Overman, MD

## 2023-06-19 NOTE — Patient Instructions (Addendum)
F/U as before , call if you need me sooner   I recommend daily certrizine  (zyrtec) and sparing once daily use of hydrocortisone cream to rash  on face  No infection present , just allergic dermatitis and dry skin  Use tepid water to wash face and may apply coconut oil for dryness  Fasting labs tomorrow cBC, lipid ,cmp and eGFr, tSH , vit D  Thanks for choosing [facility], we consider it a privelige to serve you.

## 2023-06-21 LAB — TSH: TSH: 0.411 u[IU]/mL — ABNORMAL LOW (ref 0.450–4.500)

## 2023-06-21 LAB — LIPID PANEL
Chol/HDL Ratio: 2.7 {ratio} (ref 0.0–4.4)
Cholesterol, Total: 193 mg/dL (ref 100–199)
HDL: 72 mg/dL (ref >39)
LDL Chol Calc (NIH): 105 mg/dL — ABNORMAL HIGH (ref 0–99)
Triglycerides: 90 mg/dL (ref 0–149)
VLDL Cholesterol Cal: 16 mg/dL (ref 5–40)

## 2023-06-21 LAB — CBC WITH DIFFERENTIAL/PLATELET
Basophils Absolute: 0.1 10*3/uL (ref 0.0–0.2)
Basos: 1 %
EOS (ABSOLUTE): 0.1 10*3/uL (ref 0.0–0.4)
Eos: 2 %
Hematocrit: 39.1 % (ref 34.0–46.6)
Hemoglobin: 12.6 g/dL (ref 11.1–15.9)
Immature Grans (Abs): 0 10*3/uL (ref 0.0–0.1)
Immature Granulocytes: 0 %
Lymphocytes Absolute: 3.2 10*3/uL — ABNORMAL HIGH (ref 0.7–3.1)
Lymphs: 52 %
MCH: 30.8 pg (ref 26.6–33.0)
MCHC: 32.2 g/dL (ref 31.5–35.7)
MCV: 96 fL (ref 79–97)
Monocytes Absolute: 0.4 10*3/uL (ref 0.1–0.9)
Monocytes: 7 %
Neutrophils Absolute: 2.3 10*3/uL (ref 1.4–7.0)
Neutrophils: 38 %
Platelets: 290 10*3/uL (ref 150–450)
RBC: 4.09 x10E6/uL (ref 3.77–5.28)
RDW: 12.6 % (ref 11.7–15.4)
WBC: 6.1 10*3/uL (ref 3.4–10.8)

## 2023-06-21 LAB — CMP14+EGFR
ALT: 12 [IU]/L (ref 0–32)
AST: 13 [IU]/L (ref 0–40)
Albumin: 4 g/dL (ref 3.8–4.9)
Alkaline Phosphatase: 100 [IU]/L (ref 44–121)
BUN/Creatinine Ratio: 14 (ref 9–23)
BUN: 11 mg/dL (ref 6–24)
Bilirubin Total: 0.3 mg/dL (ref 0.0–1.2)
CO2: 26 mmol/L (ref 20–29)
Calcium: 9.4 mg/dL (ref 8.7–10.2)
Chloride: 104 mmol/L (ref 96–106)
Creatinine, Ser: 0.81 mg/dL (ref 0.57–1.00)
Globulin, Total: 2.4 g/dL (ref 1.5–4.5)
Glucose: 97 mg/dL (ref 70–99)
Potassium: 4.1 mmol/L (ref 3.5–5.2)
Sodium: 142 mmol/L (ref 134–144)
Total Protein: 6.4 g/dL (ref 6.0–8.5)
eGFR: 84 mL/min/{1.73_m2} (ref >59)

## 2023-06-21 LAB — VITAMIN D 25 HYDROXY (VIT D DEFICIENCY, FRACTURES): Vit D, 25-Hydroxy: 36.4 ng/mL (ref 30.0–100.0)

## 2023-06-23 NOTE — Progress Notes (Signed)
Visit rescheduled , no charge, no evaluation

## 2023-06-24 ENCOUNTER — Other Ambulatory Visit: Payer: Self-pay

## 2023-06-24 DIAGNOSIS — E0789 Other specified disorders of thyroid: Secondary | ICD-10-CM

## 2023-07-06 ENCOUNTER — Encounter: Payer: Self-pay | Admitting: Family Medicine

## 2023-07-09 DIAGNOSIS — L853 Xerosis cutis: Secondary | ICD-10-CM | POA: Insufficient documentation

## 2023-07-09 DIAGNOSIS — L239 Allergic contact dermatitis, unspecified cause: Secondary | ICD-10-CM | POA: Insufficient documentation

## 2023-07-09 NOTE — Assessment & Plan Note (Signed)
May use cocnut oil twice daily to affected skin

## 2023-07-09 NOTE — Assessment & Plan Note (Signed)
Start daily zyrtec and sparing use of hydrocortisone cream

## 2023-08-13 ENCOUNTER — Encounter: Payer: Self-pay | Admitting: Family Medicine

## 2023-08-13 ENCOUNTER — Other Ambulatory Visit: Payer: Self-pay | Admitting: Family Medicine

## 2023-08-13 ENCOUNTER — Ambulatory Visit (INDEPENDENT_AMBULATORY_CARE_PROVIDER_SITE_OTHER): Payer: Self-pay | Admitting: Family Medicine

## 2023-08-13 VITALS — BP 119/74 | HR 70 | Resp 16 | Ht 64.0 in | Wt 185.8 lb

## 2023-08-13 DIAGNOSIS — E66811 Obesity, class 1: Secondary | ICD-10-CM

## 2023-08-13 DIAGNOSIS — J302 Other seasonal allergic rhinitis: Secondary | ICD-10-CM | POA: Diagnosis not present

## 2023-08-13 DIAGNOSIS — M5440 Lumbago with sciatica, unspecified side: Secondary | ICD-10-CM

## 2023-08-13 DIAGNOSIS — M5441 Lumbago with sciatica, right side: Secondary | ICD-10-CM

## 2023-08-13 DIAGNOSIS — Z6831 Body mass index (BMI) 31.0-31.9, adult: Secondary | ICD-10-CM

## 2023-08-13 DIAGNOSIS — G8929 Other chronic pain: Secondary | ICD-10-CM | POA: Diagnosis not present

## 2023-08-13 DIAGNOSIS — M5442 Lumbago with sciatica, left side: Secondary | ICD-10-CM | POA: Diagnosis not present

## 2023-08-13 DIAGNOSIS — Z1231 Encounter for screening mammogram for malignant neoplasm of breast: Secondary | ICD-10-CM

## 2023-08-13 DIAGNOSIS — M170 Bilateral primary osteoarthritis of knee: Secondary | ICD-10-CM

## 2023-08-13 DIAGNOSIS — E6609 Other obesity due to excess calories: Secondary | ICD-10-CM

## 2023-08-13 MED ORDER — CETIRIZINE HCL 10 MG PO TABS: 10.0000 mg | ORAL_TABLET | Freq: Two times a day (BID) | ORAL | 5 refills | Status: AC

## 2023-08-13 MED ORDER — PREDNISONE 10 MG PO TABS: 10.0000 mg | ORAL_TABLET | Freq: Two times a day (BID) | ORAL | 0 refills | Status: DC

## 2023-08-13 MED ORDER — METHYLPREDNISOLONE ACETATE 80 MG/ML IJ SUSP
80.0000 mg | Freq: Once | INTRAMUSCULAR | Status: AC
Start: 2023-08-13 — End: 2023-08-13
  Administered 2023-08-13: 80 mg via INTRAMUSCULAR

## 2023-08-13 MED ORDER — PANTOPRAZOLE SODIUM 40 MG PO TBEC: 40.0000 mg | DELAYED_RELEASE_TABLET | Freq: Every day | ORAL | 5 refills | Status: AC

## 2023-08-13 MED ORDER — HYDROCODONE-ACETAMINOPHEN 5-325 MG PO TABS: ORAL_TABLET | ORAL | 0 refills | Status: AC

## 2023-08-13 MED ORDER — KETOROLAC TROMETHAMINE 60 MG/2ML IM SOLN
30.0000 mg | Freq: Once | INTRAMUSCULAR | Status: AC
Start: 2023-08-13 — End: 2023-08-13
  Administered 2023-08-13: 30 mg via INTRAMUSCULAR

## 2023-08-13 NOTE — Patient Instructions (Addendum)
 Annual with PAP IN 2 TO 3 MONTHS, CALL IF YOU NEED ME SOONER  PLEASE SCHEDULE MAMMOGRAM WHICH IS OVERDUE IN [facility]  OR AT [facility]  Toradol 30 mg iM and depo medrol 80 mg IM in office today for p;ain and 5 day course prednisone is prescribed  Zyrtec twice daily and scant hydrocortisone cream to itchy ares as needed  CONGRATS and all the best!  Thanks for choosing [facility], we consider it a privelige to serve you.

## 2023-08-18 ENCOUNTER — Encounter: Payer: Self-pay | Admitting: Family Medicine

## 2023-08-18 NOTE — Assessment & Plan Note (Signed)
 Current flare, toradol 30 mg and depo medrol 80 mg IM in office followed by short course of prednisone Limited hydrocodone also prescribed

## 2023-08-18 NOTE — Assessment & Plan Note (Signed)
  Patient re-educated about  the importance of commitment to a  minimum of 150 minutes of exercise per week as able.  The importance of healthy food choices with portion control discussed, as well as eating regularly and within a 12 hour window most days. The need to choose "clean , green" food 50 to 75% of the time is discussed, as well as to make water the primary drink and set a goal of 64 ounces water daily.       08/13/2023    3:08 PM 06/19/2023    1:09 PM 05/12/2023    4:18 PM  Weight /BMI  Weight 185 lb 12.8 oz 165 lb 189 lb 9.6 oz  Height 5\' 4"  (1.626 m) 5\' 4"  (1.626 m) 5\' 4"  (1.626 m)  BMI 31.89 kg/m2 28.32 kg/m2 32.54 kg/m2

## 2023-08-18 NOTE — Assessment & Plan Note (Signed)
 Current flare anti inflammatory as for sciatica and weight loss

## 2023-08-18 NOTE — Progress Notes (Signed)
   Anna Cantu     MRN: [id number]      DOB: 1966-02-23  Chief Complaint  Patient presents with   Follow-up    Hurting in both knees, flared up about a week ago and her low back. Rates pain 10/10    HPI Anna Cantu is here for follow up and re-evaluation of chronic medical conditions, medication management and review of any available recent lab and radiology data.  Preventive health is updated, specifically  Cancer screening and Immunization.   Questions or concerns regarding consultations or procedures which the PT has had in the interim are  addressed. The PT denies any adverse reactions to current medications since the last visit.  Specific complaint as above, requests also limited amount of hydrocodone tabs which she uses very sporadically for uncontrolled pain, travelling in am across country and feels she wil need some  extra pain med, last filled over 6 months ago ROS Denies recent fever or chills. C/o  sinus pressure and  nasal congestion, denies ear pain or sore throat. Denies chest congestion, productive cough or wheezing. Denies chest pains, palpitations and leg swelling Denies abdominal pain, nausea, vomiting,diarrhea or constipation.   Denies dysuria, frequency, hesitancy or incontinence. y. Denies headaches, seizures, numbness, or tingling. Denies depression, anxiety or insomnia. C/o itchy areas with rash PE  BP 119/74   Pulse 70   Resp 16   Ht 5\' 4"  (1.626 m)   Wt 185 lb 12.8 oz (84.3 kg)   LMP 07/01/2013   SpO2 96%   BMI 31.89 kg/m   Patient alert and oriented and in no cardiopulmonary distress.  HEENT: No facial asymmetry, EOMI,     Neck supple .Nasal congestion, no sinus tenderness  Chest: Clear to auscultation bilaterally.  CVS: S1, S2 no murmurs, no S3.Regular rate.  ABD: Soft non tender.   Ext: No edema  MS: decreased  ROM lumbar spine, adequate in shoulders, hips and reduced in knees.  Skin: Intact, macular rash in flexuresPsych:  Good eye contact, normal affect. Memory intact not anxious or depressed appearing.  CNS: CN 2-12 intact, power,  normal throughout.no focal deficits noted.   Assessment & Plan  Chronic bilateral low back pain with bilateral sciatica Current flare, toradol 30 mg and depo medrol 80 mg IM in office followed by short course of prednisone Limited hydrocodone also prescribed  Seasonal allergies Currently uncontrolled ,commit to daily zyrtec  Osteoarthritis of both knees Current flare anti inflammatory as for sciatica and weight loss  Obesity  Patient re-educated about  the importance of commitment to a  minimum of 150 minutes of exercise per week as able.  The importance of healthy food choices with portion control discussed, as well as eating regularly and within a 12 hour window most days. The need to choose "clean , green" food 50 to 75% of the time is discussed, as well as to make water the primary drink and set a goal of 64 ounces water daily.       08/13/2023    3:08 PM 06/19/2023    1:09 PM 05/12/2023    4:18 PM  Weight /BMI  Weight 185 lb 12.8 oz 165 lb 189 lb 9.6 oz  Height 5\' 4"  (1.626 m) 5\' 4"  (1.626 m) 5\' 4"  (1.626 m)  BMI 31.89 kg/m2 28.32 kg/m2 32.54 kg/m2

## 2023-08-18 NOTE — Assessment & Plan Note (Signed)
 Currently uncontrolled ,commit to daily zyrtec

## 2023-10-21 ENCOUNTER — Encounter: Payer: Self-pay | Admitting: Family Medicine

## 2023-10-21 ENCOUNTER — Ambulatory Visit (INDEPENDENT_AMBULATORY_CARE_PROVIDER_SITE_OTHER): Admitting: Family Medicine

## 2023-10-21 ENCOUNTER — Other Ambulatory Visit (HOSPITAL_COMMUNITY)
Admission: RE | Admit: 2023-10-21 | Discharge: 2023-10-21 | Disposition: A | Discharge status: Home / Self Care | Source: Ambulatory Visit | Attending: Family Medicine | Admitting: Family Medicine

## 2023-10-21 VITALS — BP 119/78 | HR 62 | Resp 16 | Ht 64.0 in | Wt 179.0 lb

## 2023-10-21 DIAGNOSIS — Z124 Encounter for screening for malignant neoplasm of cervix: Secondary | ICD-10-CM | POA: Insufficient documentation

## 2023-10-21 DIAGNOSIS — L818 Other specified disorders of pigmentation: Secondary | ICD-10-CM | POA: Insufficient documentation

## 2023-10-21 DIAGNOSIS — Z0000 Encounter for general adult medical examination without abnormal findings: Secondary | ICD-10-CM | POA: Insufficient documentation

## 2023-10-21 NOTE — Assessment & Plan Note (Signed)

## 2023-10-21 NOTE — Progress Notes (Signed)
    Anna Cantu     MRN: [id number]      DOB: 1965/07/16  Chief Complaint  Patient presents with   Annual Exam    Cpe w pap     HPI: Patient is in for annual physical exam. No other health concerns are expressed or addressed at the visit. Recent labs,  are reviewed. Immunization is reviewed , and  updated if needed.   PE: BP 119/78   Pulse 62   Resp 16   Ht 5\' 4"  (1.626 m)   Wt 179 lb 0.6 oz (81.2 kg)   LMP 07/01/2013   SpO2 99%   BMI 30.73 kg/m   Pleasant  female, alert and oriented x 3, in no cardio-pulmonary distress. Afebrile. HEENT No facial trauma or asymetry. Sinuses non tender.  Extra occullar muscles intact.. External ears normal, . Neck: supple, no adenopathy,JVD or thyromegaly.No bruits.  Chest: Clear to ascultation bilaterally.No crackles or wheezes. Non tender to palpation  Breast: No asymetry,no masses or lumps. No tenderness. No nipple discharge or inversion. No axillary or supraclavicular adenopathy  Cardiovascular system; Heart sounds normal,  S1 and  S2 ,no S3.  No murmur, or thrill. Apical beat not displaced Peripheral pulses normal.  Abdomen: Soft, non tender, no organomegaly or masses. No bruits. Bowel sounds normal. No guarding, tenderness or rebound.   GU: External genitalia normal female genitalia , normal female distribution of hair. No lesions. Urethral meatus normal in size, no  Prolapse, no lesions visibly  Present. Bladder non tender. Vagina pink and moist , with no visible lesions , discharge present . Adequate pelvic support no  cystocele or rectocele noted Cervix pink and appears healthy, no lesions or ulcerations noted, no discharge noted from os Uterus normal size, no adnexal masses, no cervical motion or adnexal tenderness.   Musculoskeletal exam: Full ROM of spine, hips , shoulders and knees. No deformity ,swelling or crepitus noted. No muscle wasting or atrophy.   Neurologic: Cranial nerves 2 to 12  intact. Power, tone ,sensation and reflexes normal throughout. No disturbance in gait. No tremor.  Skin: Intact, no ulceration, erythema , scaling or rash noted. Hyperpigmentation under and around eyes Psych; Normal mood and affect. Judgement and concentration normal   Assessment & Plan:  Annual physical exam Annual exam as documented. Counseling done  re healthy lifestyle involving commitment to 150 minutes exercise per week, heart healthy diet, and attaining healthy weight.The importance of adequate sleep also discussed. Regular seat belt use and home safety, is also discussed. Changes in health habits are decided on by the patient with goals and time frames  set for achieving them. Immunization and cancer screening needs are specifically addressed at this visit.   Hyperpigmentation of skin of eye region Concerned as spouse now asking ablout this, has been using make up as a cover up, will refer derm

## 2023-10-21 NOTE — Patient Instructions (Addendum)
 F/U in 6 mopnths, calll if you need me sooner  CONGRATS, you have lost 6 pounds since March!!  You are being referred to Dermatology re dark skin under eyes, in the interim use makeup  Pls schedule mammogram at checkout if any available today GREAT!!   Shingrix  #2 today if pt agreesrequested discontinuation due to adverse reaction to shingrix  1  Thanks for choosing [facility], we consider it a privelige to serve you.

## 2023-10-21 NOTE — Assessment & Plan Note (Signed)
 Concerned as spouse now asking ablout this, has been using make up as a cover up, will refer derm

## 2023-10-28 ENCOUNTER — Ambulatory Visit: Payer: Self-pay | Admitting: Family Medicine

## 2023-10-28 DIAGNOSIS — R87810 Cervical high risk human papillomavirus (HPV) DNA test positive: Secondary | ICD-10-CM

## 2023-10-28 DIAGNOSIS — R87612 Low grade squamous intraepithelial lesion on cytologic smear of cervix (LGSIL): Secondary | ICD-10-CM

## 2023-10-28 LAB — CYTOLOGY - PAP
Comment: NEGATIVE
High risk HPV: POSITIVE — AB

## 2023-10-31 ENCOUNTER — Telehealth: Payer: Self-pay

## 2023-10-31 NOTE — Telephone Encounter (Signed)
 Copied from CRM [id number]. Topic: Clinical - Medical Advice >> Oct 31, 2023 10:57 AM Antwanette L wrote: Reason for CRM: Patient is experiencing a sore throat and nauseous. The patient wants to know if Dr. Rodolph Clap can prescribe something.  Patient can be contacted by phone at [phone].  Pharmacy Details [facility] [phone] [facility] [address] [place] [address] Phone: [phone] Fax: [phone]

## 2023-11-03 ENCOUNTER — Ambulatory Visit (HOSPITAL_COMMUNITY)
Admission: RE | Admit: 2023-11-03 | Discharge: 2023-11-03 | Disposition: A | Discharge status: Home / Self Care | Source: Ambulatory Visit | Attending: Family Medicine | Admitting: Family Medicine

## 2023-11-03 DIAGNOSIS — Z1231 Encounter for screening mammogram for malignant neoplasm of breast: Secondary | ICD-10-CM | POA: Insufficient documentation

## 2023-11-03 NOTE — Telephone Encounter (Signed)
Pt informed

## 2023-11-03 NOTE — Telephone Encounter (Signed)
 Lvm to cb

## 2023-11-03 NOTE — Telephone Encounter (Signed)
 If sore throat and fever needs to go to [facility], If not , gargle with salt water  and voice rest, if symptoms persist or worsen needs to get UC eval if no appts available in office For nausea if noit vomiting then ginger helpswith the symptom If vomiting then needs UC eval or in office acute if available appts

## 2023-12-01 ENCOUNTER — Encounter: Payer: Self-pay | Admitting: Obstetrics & Gynecology

## 2023-12-01 ENCOUNTER — Ambulatory Visit: Admitting: Obstetrics & Gynecology

## 2023-12-01 VITALS — BP 122/81 | HR 72 | Ht 65.0 in | Wt 181.0 lb

## 2023-12-01 DIAGNOSIS — R87612 Low grade squamous intraepithelial lesion on cytologic smear of cervix (LGSIL): Secondary | ICD-10-CM | POA: Diagnosis not present

## 2023-12-01 NOTE — Progress Notes (Signed)
    Colposcopy Procedure Note:    Colposcopy Procedure Note  Indications:  2025 LSIL + HPV neg 16/18    2019 ASCCP recommendation:  Smoker:  stopped 2023 New sexual partner:  No.  : time frame:  No.  History of abnormal Pap: no  Procedure Details  The risks and benefits of the procedure and Written informed consent obtained.  Speculum placed in vagina and excellent visualization of cervix achieved, cervix swabbed x 3 with acetic acid solution.  Findings: Adequate colposcopy is noted today.  Cervix: no visible lesions, no mosaicism, no punctation, and no abnormal vasculature; SCJ visualized 360 degrees without lesions and no biopsies taken. Vaginal inspection: normal without visible lesions. Vulvar colposcopy: vulvar colposcopy not performed.  Specimens: none  Complications: none.  Colposcopic Impression: Normal colposcopy  Plan(Based on 2019 ASCCP recommendations) Repet HPV based cytology 1 year

## 2024-01-07 ENCOUNTER — Ambulatory Visit: Payer: Self-pay

## 2024-01-07 ENCOUNTER — Encounter: Payer: Self-pay | Admitting: Family Medicine

## 2024-01-07 NOTE — Telephone Encounter (Signed)
 FYI Only or Action Required?: FYI only for provider.  Patient was last seen in primary care on 10/21/2023 by Antonetta Rollene BRAVO, MD.  Called Nurse Triage reporting Urinary Frequency.  Symptoms began yesterday.  Interventions attempted: OTC medications: AZO.  Symptoms are: gradually worsening.  Triage Disposition: See HCP Within 4 Hours (Or PCP Triage), See Physician Within 24 Hours  Patient/caregiver understands and will follow disposition?: Yes  **Patient will be seen in UC as she is wanting to be seen today**         Copied from CRM #[id number]. Topic: Clinical - Red Word Triage >> Jan 07, 2024  2:38 PM Fonda T wrote: Red Word that prompted transfer to Nurse Triage: Received call from patient, reports nausea, and vaginal burning, and frequency, also reports back pain, nausea, no appetite. Reason for Disposition  Urinating more frequently than usual (i.e., frequency) OR new-onset of the feeling of an urgent need to urinate (i.e., urgency)  Side (flank) or lower back pain present  Answer Assessment - Initial Assessment Questions 1. SYMPTOM: What's the main symptom you're concerned about? (e.g., frequency, incontinence)      Burning, pain, frequency   2. ONSET: When did the  symptoms  start?      X 1 day   3. PAIN: Is there any pain? If Yes, ask: How bad is it? (Scale: 1-10; mild, moderate, severe)      Severe pain   4. CAUSE: What do you think is causing the symptoms?      Possible UTI     5. OTHER SYMPTOMS: Do you have any other symptoms? (e.g., blood in urine, fever, flank pain, pain with urination)  Nausea, no appetite, flank pain noted. Taking AZO OTC.  Protocols used: Urinary Symptoms-A-AH

## 2024-02-04 ENCOUNTER — Ambulatory Visit: Payer: Self-pay

## 2024-02-04 ENCOUNTER — Ambulatory Visit: Admitting: Family Medicine

## 2024-02-04 NOTE — Telephone Encounter (Signed)
 Patient scheduled.

## 2024-02-04 NOTE — Telephone Encounter (Signed)
 FYI Only or Action Required?: FYI only for provider.  Patient was last seen in primary care on 10/21/2023 by Antonetta Rollene BRAVO, MD.  Called Nurse Triage reporting Cough.  Symptoms began several days ago.  Interventions attempted: OTC medications: Mucinex, Nyquil.  Symptoms are: gradually worsening.  Triage Disposition: See Physician Within 24 Hours  Patient/caregiver understands and will follow disposition?: Yes, no availability at clinic, pt scheduled for VV due to transportation.         Copied from CRM [id number]. Topic: Clinical - Red Word Triage >> Feb 04, 2024  8:25 AM Antwanette L wrote: Red Word that prompted transfer to Nurse Triage: Pt has the flu and is experiencing body aches, chill, fatigue, and runny nose. Pt is requesting medicine. Reason for Disposition  Earache  Answer Assessment - Initial Assessment Questions 1. ONSET: When did the cough begin?      Monday, worsening yesterday 3. SPUTUM: Describe the color of your sputum (e.g., none, dry cough; clear, white, yellow, green)     Brown 4. HEMOPTYSIS: Are you coughing up any blood? If Yes, ask: How much? (e.g., flecks, streaks, tablespoons, etc.)     None 5. DIFFICULTY BREATHING: Are you having difficulty breathing? If Yes, ask: How bad is it? (e.g., mild, moderate, severe)      SOB with exertion 6. FEVER: Do you have a fever? If Yes, ask: What is your temperature, how was it measured, and when did it start?     Not measured, chills, body aches 7. CARDIAC HISTORY: Do you have any history of heart disease? (e.g., heart attack, congestive heart failure)      None 8. LUNG HISTORY: Do you have any history of lung disease?  (e.g., pulmonary embolus, asthma, emphysema)     None 10. OTHER SYMPTOMS: Do you have any other symptoms? (e.g., runny nose, wheezing, chest pain)       Nausea, runny nose, wheezing has since resolved  Protocols used: Cough - Acute Productive-A-AH

## 2024-04-20 ENCOUNTER — Ambulatory Visit: Admitting: Family Medicine

## 2024-06-15 ENCOUNTER — Ambulatory Visit: Admitting: Dermatology
